# Patient Record
Sex: Female | Born: 2000
Health system: Southern US, Community
[De-identification: ages and names within clinical notes are randomized; demographics above are authoritative.]

## PROBLEM LIST (undated history)

## (undated) DIAGNOSIS — K219 Gastro-esophageal reflux disease without esophagitis: Secondary | ICD-10-CM

## (undated) DIAGNOSIS — G43909 Migraine, unspecified, not intractable, without status migrainosus: Secondary | ICD-10-CM

## (undated) DIAGNOSIS — Z9889 Other specified postprocedural states: Secondary | ICD-10-CM

## (undated) DIAGNOSIS — Z789 Other specified health status: Secondary | ICD-10-CM

## (undated) DIAGNOSIS — R112 Nausea with vomiting, unspecified: Secondary | ICD-10-CM

## (undated) DIAGNOSIS — M719 Bursopathy, unspecified: Secondary | ICD-10-CM

## (undated) HISTORY — PX: DENTAL SURGERY: SHX609

## (undated) HISTORY — DX: Gastro-esophageal reflux disease without esophagitis: K21.9

## (undated) HISTORY — PX: TYMPANOSTOMY TUBE PLACEMENT: SHX32

---
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
## 2019-12-10 ENCOUNTER — Encounter: Payer: Self-pay | Admitting: Internal Medicine

## 2019-12-24 ENCOUNTER — Ambulatory Visit: Payer: Self-pay | Admitting: Nurse Practitioner

## 2019-12-24 NOTE — Progress Notes (Deleted)
Primary Care Physician:  [name], MD Primary Gastroenterologist:  Dr.   Bonnetta Barry chief complaint on file.   HPI:   Annette Gould is a 19 y.o. female who presents on referral from primary care on 12/10/2019 for nausea.  Reviewed information provided with referral including ***.  No history of colonoscopy or endoscopy in our system.  No history of recent labs in our system.  Today she states   No past medical history on file.  *** The histories are not reviewed yet. Please review them in the "History" navigator section and refresh this SmartLink.  No current outpatient medications on file.   No current facility-administered medications for this visit.    Allergies as of 12/24/2019  . (Not on File)    No family history on file.  Social History   Socioeconomic History  . Marital status: Single    Spouse name: Not on file  . Number of children: Not on file  . Years of education: Not on file  . Highest education level: Not on file  Occupational History  . Not on file  Tobacco Use  . Smoking status: Not on file  Substance and Sexual Activity  . Alcohol use: Not on file  . Drug use: Not on file  . Sexual activity: Not on file  Other Topics Concern  . Not on file  Social History Narrative  . Not on file   Social Determinants of Health   Financial Resource Strain:   . Difficulty of Paying Living Expenses:   Food Insecurity:   . Worried About Programme researcher, broadcasting/film/video in the Last Year:   . Barista in the Last Year:   Transportation Needs:   . Freight forwarder (Medical):   Marland Kitchen Lack of Transportation (Non-Medical):   Physical Activity:   . Days of Exercise per Week:   . Minutes of Exercise per Session:   Stress:   . Feeling of Stress :   Social Connections:   . Frequency of Communication with Friends and Family:   . Frequency of Social Gatherings with Friends and Family:   . Attends Religious Services:   . Active Member of Clubs or Organizations:    . Attends Banker Meetings:   Marland Kitchen Marital Status:   Intimate Partner Violence:   . Fear of Current or Ex-Partner:   . Emotionally Abused:   Marland Kitchen Physically Abused:   . Sexually Abused:     Review of Systems: General: Negative for anorexia, weight loss, fever, chills, fatigue, weakness. Eyes: Negative for vision changes.  ENT: Negative for hoarseness, difficulty swallowing , nasal congestion. CV: Negative for chest pain, angina, palpitations, dyspnea on exertion, peripheral edema.  Respiratory: Negative for dyspnea at rest, dyspnea on exertion, cough, sputum, wheezing.  GI: See history of present illness. GU:  Negative for dysuria, hematuria, urinary incontinence, urinary frequency, nocturnal urination.  MS: Negative for joint pain, low back pain.  Derm: Negative for rash or itching.  Neuro: Negative for weakness, abnormal sensation, seizure, frequent headaches, memory loss, confusion.  Psych: Negative for anxiety, depression, suicidal ideation, hallucinations.  Endo: Negative for unusual weight change.  Heme: Negative for bruising or bleeding. Allergy: Negative for rash or hives.    Physical Exam: There were no vitals taken for this visit. General:   Alert and oriented. Pleasant and cooperative. Well-nourished and well-developed.  Head:  Normocephalic and atraumatic. Eyes:  Without icterus, sclera clear and conjunctiva pink.  Ears:  Normal  auditory acuity. Mouth:  No deformity or lesions, oral mucosa pink.  Throat/Neck:  Supple, without mass or thyromegaly. Cardiovascular:  S1, S2 present without murmurs appreciated. Normal pulses noted. Extremities without clubbing or edema. Respiratory:  Clear to auscultation bilaterally. No wheezes, rales, or rhonchi. No distress.  Gastrointestinal:  +BS, soft, non-tender and non-distended. No HSM noted. No guarding or rebound. No masses appreciated.  Rectal:  Deferred  Musculoskalatal:  Symmetrical without gross deformities.  Normal posture. Skin:  Intact without significant lesions or rashes. Neurologic:  Alert and oriented x4;  grossly normal neurologically. Psych:  Alert and cooperative. Normal mood and affect. Heme/Lymph/Immune: No significant cervical adenopathy. No excessive bruising noted.    12/24/2019 7:45 AM   Disclaimer: This note was dictated with voice recognition software. Similar sounding words can inadvertently be transcribed and may not be corrected upon review.

## 2019-12-28 ENCOUNTER — Emergency Department (HOSPITAL_COMMUNITY): Payer: Commercial Managed Care - PPO

## 2019-12-28 ENCOUNTER — Encounter (HOSPITAL_COMMUNITY): Payer: Self-pay | Admitting: Emergency Medicine

## 2019-12-28 ENCOUNTER — Emergency Department (HOSPITAL_COMMUNITY)
Admission: EM | Admit: 2019-12-28 | Discharge: 2019-12-28 | Disposition: A | Discharge status: Home / Self Care | Payer: Commercial Managed Care - PPO | Attending: Emergency Medicine | Admitting: Emergency Medicine

## 2019-12-28 ENCOUNTER — Other Ambulatory Visit: Payer: Self-pay

## 2019-12-28 DIAGNOSIS — Z79899 Other long term (current) drug therapy: Secondary | ICD-10-CM | POA: Insufficient documentation

## 2019-12-28 DIAGNOSIS — R1013 Epigastric pain: Secondary | ICD-10-CM | POA: Insufficient documentation

## 2019-12-28 DIAGNOSIS — R112 Nausea with vomiting, unspecified: Secondary | ICD-10-CM | POA: Insufficient documentation

## 2019-12-28 LAB — CBC WITH DIFFERENTIAL/PLATELET
Abs Immature Granulocytes: 0.03 10*3/uL (ref 0.00–0.07)
Basophils Absolute: 0.1 10*3/uL (ref 0.0–0.1)
Basophils Relative: 1 %
Eosinophils Absolute: 0.1 10*3/uL (ref 0.0–0.5)
Eosinophils Relative: 1 %
HCT: 42.4 % (ref 36.0–46.0)
Hemoglobin: 14.3 g/dL (ref 12.0–15.0)
Immature Granulocytes: 0 %
Lymphocytes Relative: 14 %
Lymphs Abs: 1.6 10*3/uL (ref 0.7–4.0)
MCH: 27 pg (ref 26.0–34.0)
MCHC: 33.7 g/dL (ref 30.0–36.0)
MCV: 80 fL (ref 80.0–100.0)
Monocytes Absolute: 0.7 10*3/uL (ref 0.1–1.0)
Monocytes Relative: 6 %
Neutro Abs: 8.8 10*3/uL — ABNORMAL HIGH (ref 1.7–7.7)
Neutrophils Relative %: 78 %
Platelets: 330 10*3/uL (ref 150–400)
RBC: 5.3 MIL/uL — ABNORMAL HIGH (ref 3.87–5.11)
RDW: 12.1 % (ref 11.5–15.5)
WBC: 11.2 10*3/uL — ABNORMAL HIGH (ref 4.0–10.5)
nRBC: 0 % (ref 0.0–0.2)

## 2019-12-28 LAB — COMPREHENSIVE METABOLIC PANEL
ALT: 16 U/L (ref 0–44)
AST: 18 U/L (ref 15–41)
Albumin: 4.4 g/dL (ref 3.5–5.0)
Alkaline Phosphatase: 60 U/L (ref 38–126)
Anion gap: 10 (ref 5–15)
BUN: 11 mg/dL (ref 6–20)
CO2: 23 mmol/L (ref 22–32)
Calcium: 9.2 mg/dL (ref 8.9–10.3)
Chloride: 104 mmol/L (ref 98–111)
Creatinine, Ser: 0.73 mg/dL (ref 0.44–1.00)
GFR calc Af Amer: >60 mL/min (ref >60)
GFR calc non Af Amer: >60 mL/min (ref >60)
Glucose, Bld: 109 mg/dL — ABNORMAL HIGH (ref 70–99)
Potassium: 3.7 mmol/L (ref 3.5–5.1)
Sodium: 137 mmol/L (ref 135–145)
Total Bilirubin: 0.4 mg/dL (ref 0.3–1.2)
Total Protein: 8 g/dL (ref 6.5–8.1)

## 2019-12-28 LAB — URINALYSIS, ROUTINE W REFLEX MICROSCOPIC
Bilirubin Urine: NEGATIVE
Glucose, UA: NEGATIVE mg/dL
Hgb urine dipstick: NEGATIVE
Ketones, ur: 20 mg/dL — AB
Leukocytes,Ua: NEGATIVE
Nitrite: NEGATIVE
Protein, ur: NEGATIVE mg/dL
Specific Gravity, Urine: 1.018 (ref 1.005–1.030)
pH: 6 (ref 5.0–8.0)

## 2019-12-28 LAB — POC URINE PREG, ED: Preg Test, Ur: NEGATIVE

## 2019-12-28 LAB — LIPASE, BLOOD: Lipase: 21 U/L (ref 11–51)

## 2019-12-28 IMAGING — US US ABDOMEN COMPLETE
1 series · 14 of 25 positions shown · non-contrast
Comparison: None

CLINICAL DATA: Upper abdominal pain with nausea vomiting for 3
months

EXAM:
ABDOMEN ULTRASOUND COMPLETE

[Series 1: us abdomen complete · 14 of 89 slices shown]
[im 1/89]
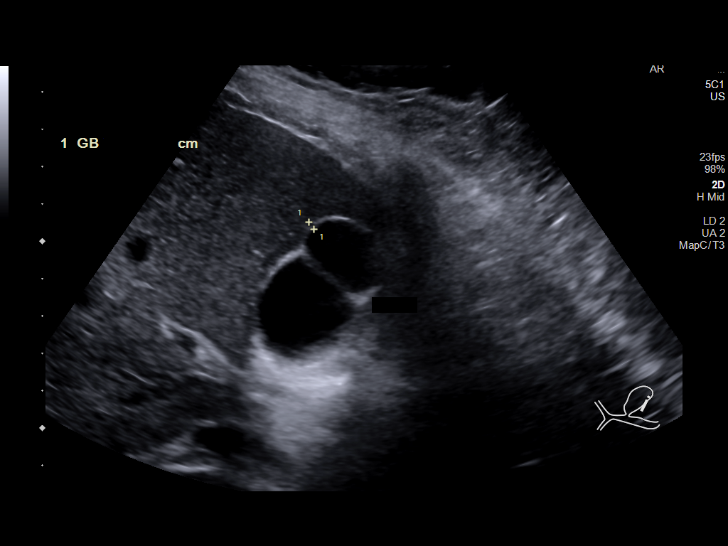
[im 8/89]
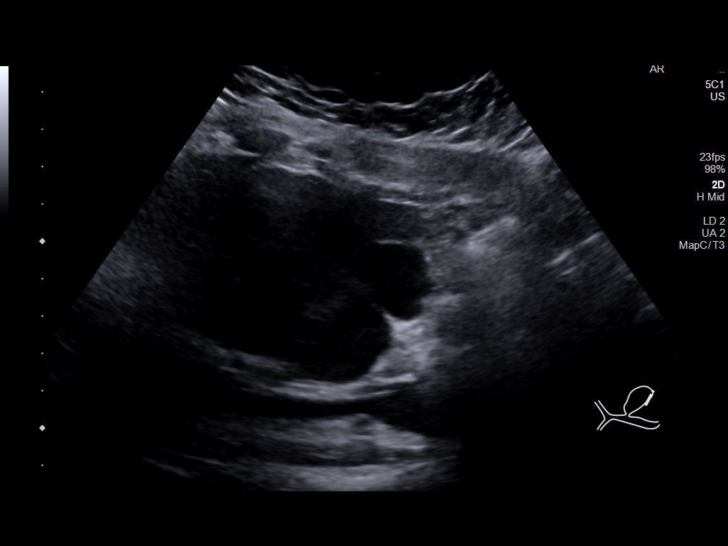
[im 15/89]
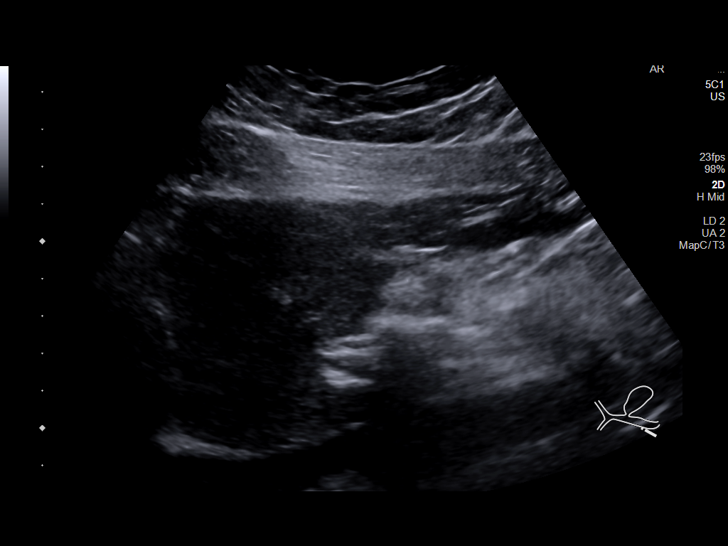
[im 23/89]
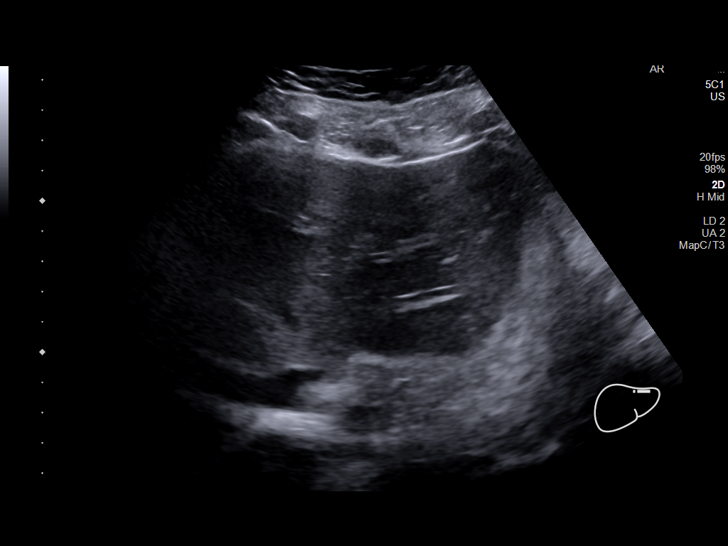
[im 30/89]
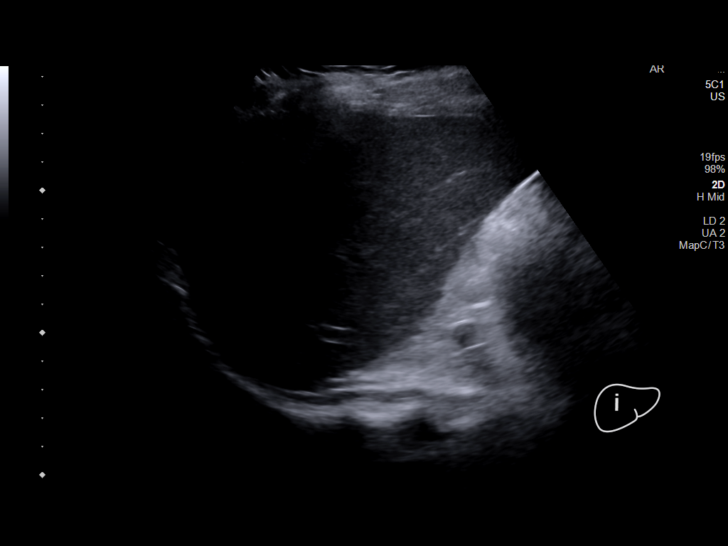
[im 34/89]
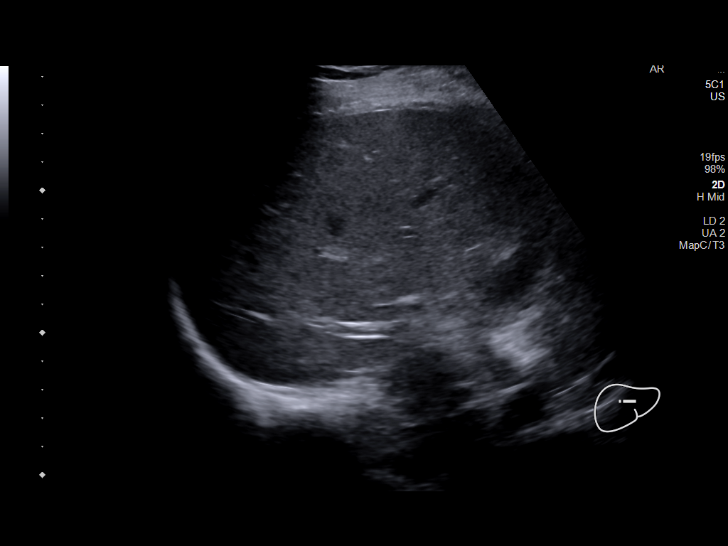
[im 41/89]
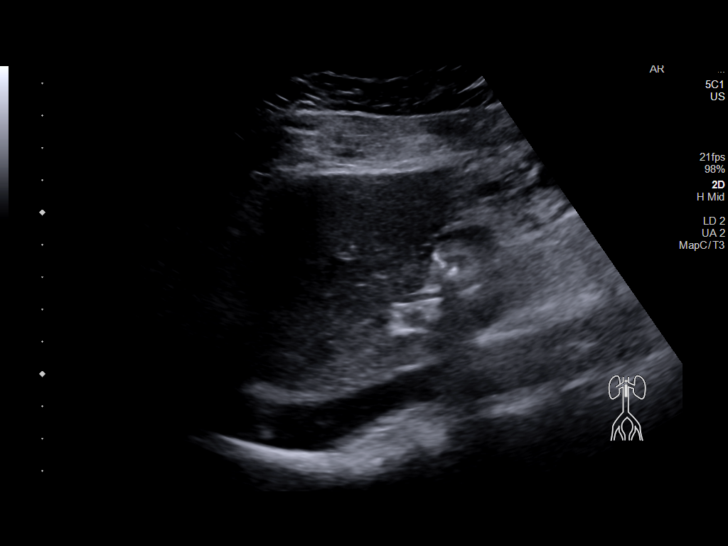
[im 48/89]
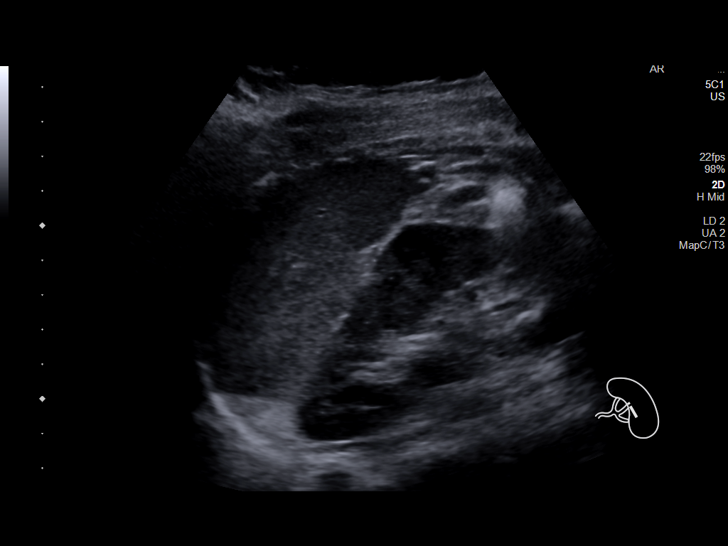
[im 56/89]
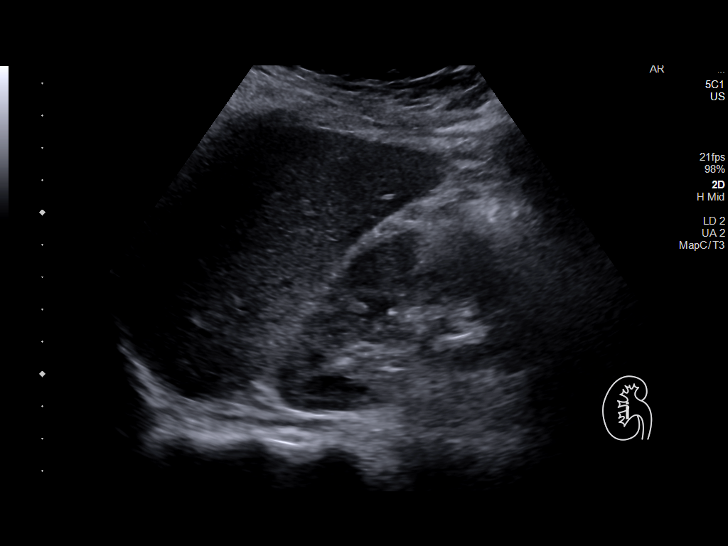
[im 59/89]
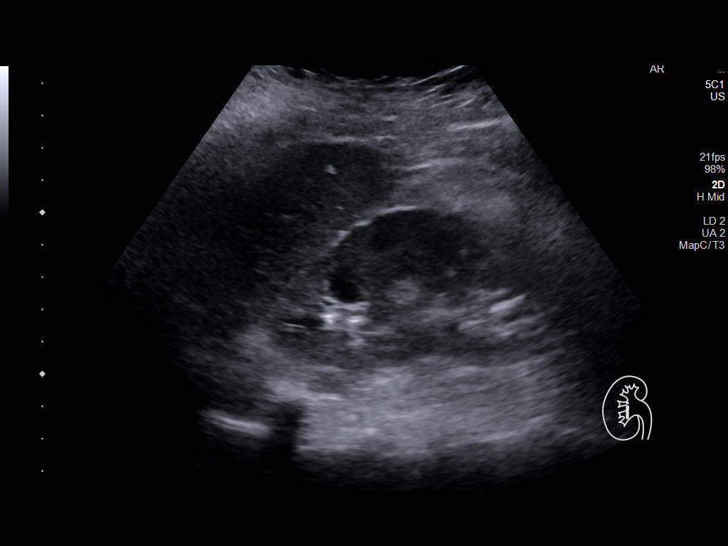
[im 67/89]
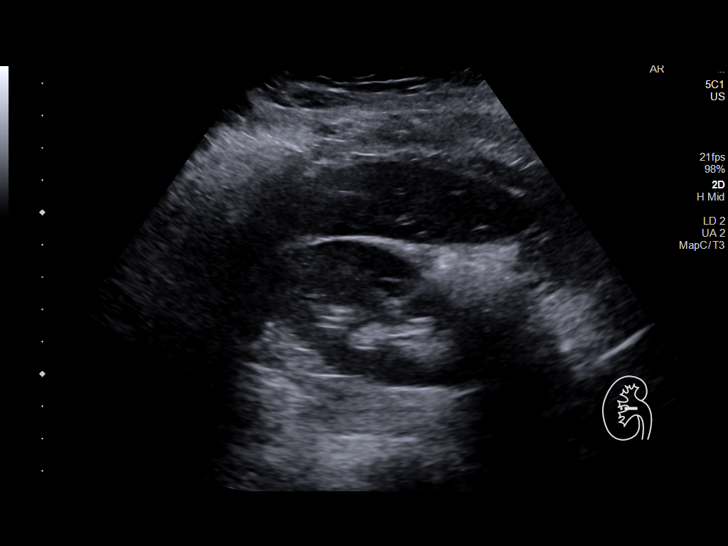
[im 74/89]
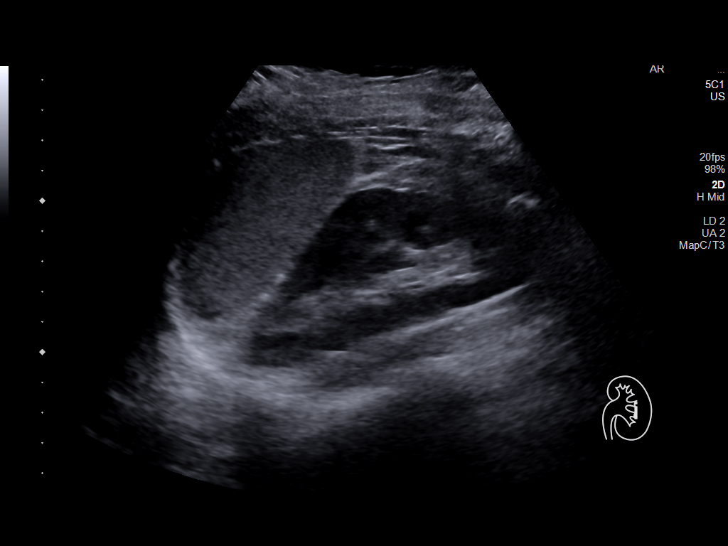
[im 81/89]
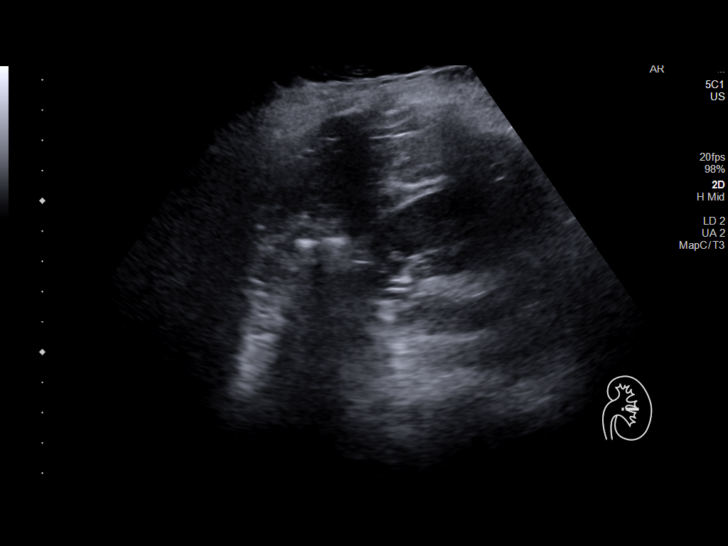
[im 89/89]
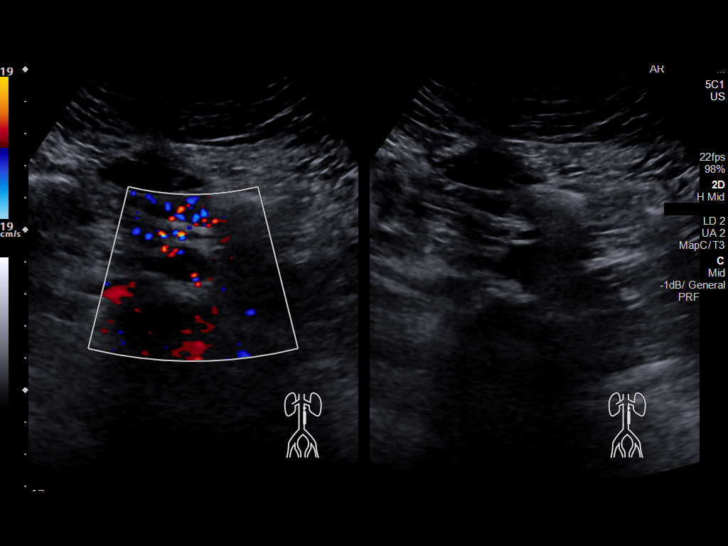

[14 of 25 positions shown; findings below may reference images not displayed]

FINDINGS: Gallbladder: Normally distended without stones or wall thickening.
Prominent fold noted. No pericholecystic fluid or sonographic Murphy
sign.

Common bile duct: Diameter: 3 mm, normal

Liver: Normal echogenicity without mass or nodularity. Portal vein
is patent on color Doppler imaging with normal direction of blood
flow towards the liver.

IVC: Normal appearance

Pancreas: Normal appearance

Spleen: Normal appearance, 8.3 cm length

Right Kidney: Length: 10.2 cm. Normal morphology without mass or
hydronephrosis.

Left Kidney: Length: 10.5 cm. Normal morphology without mass or
hydronephrosis.

Abdominal aorta: Normal caliber

Other findings: No free fluid
IMPRESSION: Normal exam.

## 2019-12-28 MED ORDER — LIDOCAINE VISCOUS HCL 2 % MT SOLN
15.0000 mL | Freq: Once | OROMUCOSAL | Status: AC
  Administered 2019-12-28: 15 mL via ORAL
  Filled 2019-12-28: qty 15

## 2019-12-28 MED ORDER — SODIUM CHLORIDE 0.9 % IV BOLUS
1000.0000 mL | Freq: Once | INTRAVENOUS | Status: AC
  Administered 2019-12-28: 09:00:00 1000 mL via INTRAVENOUS

## 2019-12-28 MED ORDER — ONDANSETRON HCL 4 MG/2ML IJ SOLN
4.0000 mg | Freq: Once | INTRAMUSCULAR | Status: AC
  Administered 2019-12-28: 4 mg via INTRAVENOUS
  Filled 2019-12-28: qty 2

## 2019-12-28 MED ORDER — FAMOTIDINE 20 MG PO TABS: 20.0000 mg | ORAL_TABLET | Freq: Two times a day (BID) | ORAL | 0 refills | Status: DC

## 2019-12-28 MED ORDER — OMEPRAZOLE 40 MG PO CPDR: 40.0000 mg | DELAYED_RELEASE_CAPSULE | Freq: Two times a day (BID) | ORAL | 0 refills | Status: DC

## 2019-12-28 MED ORDER — PANTOPRAZOLE SODIUM 40 MG IV SOLR
40.0000 mg | Freq: Once | INTRAVENOUS | Status: AC
  Administered 2019-12-28: 09:00:00 40 mg via INTRAVENOUS
  Filled 2019-12-28: qty 40

## 2019-12-28 MED ORDER — ONDANSETRON 4 MG PO TBDP: 4.0000 mg | ORAL_TABLET | Freq: Three times a day (TID) | ORAL | 0 refills | Status: DC | PRN

## 2019-12-28 MED ORDER — ALUM & MAG HYDROXIDE-SIMETH 200-200-20 MG/5ML PO SUSP
30.0000 mL | Freq: Once | ORAL | Status: AC
  Administered 2019-12-28: 11:00:00 30 mL via ORAL
  Filled 2019-12-28: qty 30

## 2019-12-28 NOTE — ED Provider Notes (Signed)
[facility] EMERGENCY DEPARTMENT Provider Note   CSN: [id number] Arrival date & time: 12/28/19  8676     History Chief Complaint  Patient presents with  . Emesis    Annette Gould is a 19 y.o. female with a past medical history of anxiety and GERD presenting with an approximate 4 month history of nausea and vomiting felt related to her acid reflux but has been more frequent the past several days.  She describes epigastric burning with radiation into her mid chest which has been constant the past several days despite being on a daily PPI.  She reports 4-5 episodes of nonbloody emesis in the past several days.  She feels lightheaded and weak, has had no PO intake in 2 days. She denies fevers, alternates between diarrhea and constipation which is a chronic pattern, currently is constipated with last bm several days ago.  She has found no alleviators for her symptoms.  Was referred to GI by her pcp, her appt was 3 days ago but did not go as she thought she was feeling better.    The history is provided by the patient.       History reviewed. No pertinent past medical history.  There are no problems to display for this patient.   Past Surgical History:  Procedure Laterality Date  . DENTAL SURGERY       OB History   No obstetric history on file.     History reviewed. No pertinent family history.  Social History   Tobacco Use  . Smoking status: Never Smoker  . Smokeless tobacco: Never Used  Substance Use Topics  . Alcohol use: Not Currently  . Drug use: Yes    Types: Marijuana    Home Medications Prior to Admission medications   Medication Sig Start Date End Date Taking? Authorizing Provider  AUROVELA FE 1.5/30 1.5-30 MG-MCG tablet Take 1 tablet by mouth daily. 11/12/19  Yes [provider]  ondansetron (ZOFRAN) 4 MG tablet Take 4 mg by mouth every 8 (eight) hours as needed for nausea or vomiting.   Yes [provider]  propranolol (INDERAL) 10 MG  tablet Take 10 mg by mouth daily. 11/12/19  Yes [provider]  sertraline (ZOLOFT) 50 MG tablet Take 75 mg by mouth daily. 11/12/19  Yes [provider]  omeprazole (PRILOSEC) 40 MG capsule Take 1 capsule (40 mg total) by mouth in the morning and at bedtime. 12/28/19   [name], PA-C  ondansetron (ZOFRAN ODT) 4 MG disintegrating tablet Take 1 tablet (4 mg total) by mouth every 8 (eight) hours as needed for nausea or vomiting. 12/28/19   Keefe Zawistowski, Raynelle Fanning, PA-C  famotidine (PEPCID) 20 MG tablet Take 1 tablet (20 mg total) by mouth 2 (two) times daily. 12/28/19 12/28/19  [name], PA-C    Allergies    Patient has no known allergies.  Review of Systems   Review of Systems  Constitutional: Negative for chills and fever.  HENT: Negative for congestion.   Eyes: Negative.   Respiratory: Negative for chest tightness and shortness of breath.   Cardiovascular: Negative for chest pain.  Gastrointestinal: Positive for abdominal pain, constipation, nausea and vomiting.  Genitourinary: Negative.  Negative for dysuria.  Musculoskeletal: Negative for arthralgias, joint swelling and neck pain.  Skin: Negative.  Negative for rash and wound.  Neurological: Positive for light-headedness. Negative for dizziness, numbness and headaches.  Psychiatric/Behavioral: Negative.     Physical Exam Updated Vital Signs BP (!) 145/94 (BP Location: Left  Arm)   Pulse 78   Temp 98.3 F (36.8 C) (Oral)   Resp 17   Ht 5\' 2"  (1.575 m)   Wt 68 kg   LMP  (Exact Date)   SpO2 97%   BMI 27.44 kg/m   Physical Exam Vitals and nursing note reviewed.  Constitutional:      Appearance: She is well-developed.  HENT:     Head: Normocephalic and atraumatic.  Eyes:     Conjunctiva/sclera: Conjunctivae normal.  Cardiovascular:     Rate and Rhythm: Normal rate and regular rhythm.     Heart sounds: Normal heart sounds.  Pulmonary:     Effort: Pulmonary effort is normal.     Breath sounds: Normal breath sounds.  No wheezing.  Abdominal:     General: Bowel sounds are normal.     Palpations: Abdomen is soft.     Tenderness: There is abdominal tenderness in the epigastric area and left upper quadrant. There is no guarding or rebound. Negative signs include Murphy's sign.  Musculoskeletal:        General: Normal range of motion.     Cervical back: Normal range of motion.  Skin:    General: Skin is warm and dry.  Neurological:     Mental Status: She is alert.     ED Results / Procedures / Treatments   Labs (all labs ordered are listed, but only abnormal results are displayed) Labs Reviewed  CBC WITH DIFFERENTIAL/PLATELET - Abnormal; Notable for the following components:      Result Value   WBC 11.2 (*)    RBC 5.30 (*)    Neutro Abs 8.8 (*)    All other components within normal limits  COMPREHENSIVE METABOLIC PANEL - Abnormal; Notable for the following components:   Glucose, Bld 109 (*)    All other components within normal limits  URINALYSIS, ROUTINE W REFLEX MICROSCOPIC - Abnormal; Notable for the following components:   APPearance HAZY (*)    Ketones, ur 20 (*)    All other components within normal limits  LIPASE, BLOOD  POC URINE PREG, ED    EKG None  Radiology Abdomen Complete  Result Date: 12/28/2019 CLINICAL DATA:  Upper abdominal pain with nausea vomiting for 3 months EXAM: ABDOMEN ULTRASOUND COMPLETE COMPARISON:  None FINDINGS: Gallbladder: Normally distended without stones or wall thickening. Prominent fold noted. No pericholecystic fluid or sonographic Murphy sign. Common bile duct: Diameter: 3 mm, normal Liver: Normal echogenicity without mass or nodularity. Portal vein is patent on color Doppler imaging with normal direction of blood flow towards the liver. IVC: Normal appearance Pancreas: Normal appearance Spleen: Normal appearance, 8.3 cm length Right Kidney: Length: 10.2 cm. Normal morphology without mass or hydronephrosis. Left Kidney: Length: 10.5 cm. Normal  morphology without mass or hydronephrosis. Abdominal aorta: Normal caliber Other findings: No free fluid IMPRESSION: Normal exam. Electronically Signed   By: 12/30/2019 M.D.   On: 12/28/2019 09:47    Procedures Procedures (including critical care time)  Medications Ordered in ED Medications  sodium chloride 0.9 % bolus 1,000 mL (0 mLs Intravenous Stopped 12/28/19 1035)  pantoprazole (PROTONIX) injection 40 mg (40 mg Intravenous Given 12/28/19 0830)  ondansetron (ZOFRAN) injection 4 mg (4 mg Intravenous Given 12/28/19 0830)  alum & mag hydroxide-simeth (MAALOX/MYLANTA) 200-200-20 MG/5ML suspension 30 mL (30 mLs Oral Given 12/28/19 1035)    And  lidocaine (XYLOCAINE) 2 % viscous mouth solution 15 mL (15 mLs Oral Given 12/28/19 1035)    ED Course  I have reviewed the triage vital signs and the nursing notes.  Pertinent labs & imaging results that were available during my care of the patient were reviewed by me and considered in my medical decision making (see chart for details).    MDM Rules/Calculators/A&P                      Labs and imaging reviewed and discussed with pt. Which are reassuring, [place] negative for acute cholecystitis.  Sx suggesting persistent acid reflux/gerd, possible peptic ulcer, no findings to suggest acute abd, perforation,  abd exam benign.  She was encouraged to f/u with GI as originally recommended by pcp.  Increased omeprazole to bid dosing. Also trial of zofran in place of phenergan for better n/v relief.    Pt was seen by Dr. Reather Converse during ed visit.  Final Clinical Impression(s) / ED Diagnoses Final diagnoses:  Epigastric pain  Non-intractable vomiting with nausea, unspecified vomiting type    Rx / DC Orders ED Discharge Orders         Ordered    ondansetron (ZOFRAN ODT) 4 MG disintegrating tablet  Every 8 hours PRN     12/28/19 1101    famotidine (PEPCID) 20 MG tablet  2 times daily,   Status:  Discontinued     12/28/19 1101    omeprazole (PRILOSEC) 40  MG capsule  2 times daily     12/28/19 1103           Evalee Jefferson, Hershal Coria 12/28/19 1109    Elnora Morrison, MD 12/29/19 [phone]

## 2019-12-28 NOTE — Discharge Instructions (Addendum)
Your lab tests and your ultrasound test today are reassuring with normal findings.  You will benefit from the expertise of a gastroenterologist and are encouraged to get this appointment rescheduled.  Continue using your omeprazole but increase this medicine to twice daily dosing.    Also, you may try zofran instead of promethazine for nausea relief.

## 2019-12-28 NOTE — ED Triage Notes (Signed)
C/o vomiting for about 3 months.  Seen about one month about by [facility] in [place] and GI consulted.  Pt says she had appointment on Monday but did not go.  C/o vomiting, headache, fever and dizziness since yesterday.  Denies being around anyone with COVID.

## 2020-01-01 ENCOUNTER — Encounter: Payer: Self-pay | Admitting: Nurse Practitioner

## 2020-01-01 ENCOUNTER — Other Ambulatory Visit: Payer: Self-pay

## 2020-01-01 ENCOUNTER — Ambulatory Visit (INDEPENDENT_AMBULATORY_CARE_PROVIDER_SITE_OTHER): Payer: Commercial Managed Care - PPO | Admitting: Nurse Practitioner

## 2020-01-01 ENCOUNTER — Telehealth: Payer: Self-pay

## 2020-01-01 DIAGNOSIS — R1010 Upper abdominal pain, unspecified: Secondary | ICD-10-CM | POA: Diagnosis not present

## 2020-01-01 DIAGNOSIS — K219 Gastro-esophageal reflux disease without esophagitis: Secondary | ICD-10-CM | POA: Insufficient documentation

## 2020-01-01 DIAGNOSIS — R112 Nausea with vomiting, unspecified: Secondary | ICD-10-CM | POA: Insufficient documentation

## 2020-01-01 DIAGNOSIS — K5900 Constipation, unspecified: Secondary | ICD-10-CM | POA: Diagnosis not present

## 2020-01-01 DIAGNOSIS — R1084 Generalized abdominal pain: Secondary | ICD-10-CM | POA: Insufficient documentation

## 2020-01-01 DIAGNOSIS — R109 Unspecified abdominal pain: Secondary | ICD-10-CM | POA: Insufficient documentation

## 2020-01-01 DIAGNOSIS — R1011 Right upper quadrant pain: Secondary | ICD-10-CM

## 2020-01-01 MED ORDER — PANTOPRAZOLE SODIUM 40 MG PO TBEC: 40.0000 mg | DELAYED_RELEASE_TABLET | Freq: Two times a day (BID) | ORAL | 3 refills | Status: DC

## 2020-01-01 NOTE — Patient Instructions (Signed)
Your health issues we discussed today were:   GERD (reflux/heartburn): 1. I have sent a prescription to your pharmacy for Protonix (pantoprazole) 40 mg.  Take this twice a day, first thing in the morning and 30 minutes before your last meal the day 2. You can continue to use famotidine (Pepcid) as needed for breakthrough GERD symptoms despite taking Protonix 3. Call us if you have any worsening or severe symptoms  Abdominal pain with nausea/vomiting and "dark stools": 1. As we discussed, I think a lot of your abdominal pain is due to your GERD symptoms 2. However, given your recent ibuprofen and other type medication use along with your dark stools, complete your stool test and bring it back to our office so we can check for blood 3. Continue to use ondansetron (Zofran) dissolving tablet as needed for nausea 4. We will put in an order for a HIDA scan and help schedule this for you 5. Further recommendations will follow your results  Constipation: 1. As we discussed, I do not think your constipation is severe enough to need a prescription medicine 2. It is likely due to decreased oral intake with your other GERD type symptoms 3. Start taking over-the-counter Colace 100 mg once a day 4. You can use MiraLAX powder, 1 dose once a day if needed for constipation 5. Call us if you have any worsening or severe symptoms  Overall I recommend:  1. Continue your other current medications 2. Return for follow-up in 4 weeks 3. Call us if you have any questions or concerns   ---------------------------------------------------------------  COVID-19 Vaccine Information can be found at: [email or web address] For questions related to vaccine distribution or appointments, please email [email or web address] or call [phone].   ---------------------------------------------------------------   At [facility] we value your  feedback. You may receive a survey about your visit today. Please share your experience as we strive to create trusting relationships with our patients to provide genuine, compassionate, quality care.  We appreciate your understanding and patience as we review any laboratory studies, imaging, and other diagnostic tests that are ordered as we care for you. Our office policy is 5 business days for review of these results, and any emergent or urgent results are addressed in a timely manner for your best interest. If you do not hear from our office in 1 week, please contact us.   We also encourage the use of MyChart, which contains your medical information for your review as well. If you are not enrolled in this feature, an access code is on this after visit summary for your convenience. Thank you for allowing [place] to be involved in your care.  It was great to see you today!  I hope you have a great day!!

## 2020-01-01 NOTE — Telephone Encounter (Signed)
Called [facility], no PA needed for HIDA scan. Ref# [id number].

## 2020-01-01 NOTE — Progress Notes (Signed)
Primary Care Physician:  [name], MD Primary Gastroenterologist:  Dr. Jena Gauss  Chief Complaint  Patient presents with  . Nausea    with vomiting  . Constipation    last BM was friday, straining  . Abdominal Pain    upper abd with some swelling    HPI:   Annette Gould is a 19 y.o. female who presents on referral from primary care for nausea.  Reviewed information provided with referral including notes from primary care indicating ongoing nausea.  The patient was referred to GI.  Noted Zofran and omeprazole.  Incidentally the patient was in the emergency department 12/28/2019 for epigastric pain and non-intractable vomiting with nausea.  At that time noted history of anxiety and GERD with 4 months of nausea and vomiting felt related to GERD but more progressive over the previous few days.  Describes epigastric burning and radiation to the chest despite daily PPI.  Noted 4-5 episodes of nonbloody emesis in the past several days, lightheadedness, weakness, no p.o. intake in 2 days.  No fevers.  Initially referred to GI by primary care but missed her appointment 3 days prior because she thought she was feeling better.  Labs including CBC found mild leukocytosis at 11.2, normal hemoglobin.  CMP and lipase essentially normal.  Urine pregnancy test negative, urinalysis not consistent with UTI.  Abdominal ultrasound with no significant findings.  Recommended follow-up with primary care and GI.  Today she states she's doing ok overall. She notes persistent N/V for the past couple months, started with the GERD/esophageal burning. Noted, esophageal burning and epigastric pain; no bitter teste. Used to be morning times, now it is throughout the day. Nausea postprandially. Food triggers include spicy foods, chocolate. Has vomiting rarely/intermittently (last episode was Friday). Pushing fluids. Also with some constipation, last bowel movement was about 4 days ago. Previously once a day BM. Started  having more constipation 2 weeks ago, recently hard stools/straining. Has been on BRAT diet due to UGI symptoms. Drinks about 1-2 bottles of water a day, but mostly drinks Gatorade and Pedialyte. Has had "really dar" stools in the last 2 weeks (denies iron or pepto). A few months ago will get 'fevers" postprandially, about 99 TMax. Denies hematochezia, chills, unintentional weight loss. Denies URI or flu-like symptoms. Denies loss of sense of taste or smell. Denies chest pain, dyspnea, dizziness, lightheadedness, syncope, near syncope. Denies any other upper or lower GI symptoms.  No recent NSAIDs but previously took it "a lot" due to headaches (last dose a few months ago). Denies ASA powders.  She is not sure if she's taking her PPI. Thought it was changed to famotadine (which according to the ER note was ADDED, but should still be on PPI). Zofran ODT helps her nausea "sometimes"  Past Medical History:  Diagnosis Date  . Known health problems: none     Past Surgical History:  Procedure Laterality Date  . DENTAL SURGERY    . TYMPANOSTOMY TUBE PLACEMENT      Current Outpatient Medications  Medication Sig Dispense Refill  . AUROVELA FE 1.5/30 1.5-30 MG-MCG tablet Take 1 tablet by mouth daily.    . ondansetron (ZOFRAN ODT) 4 MG disintegrating tablet Take 1 tablet (4 mg total) by mouth every 8 (eight) hours as needed for nausea or vomiting. 20 tablet 0  . propranolol (INDERAL) 10 MG tablet Take 10 mg by mouth daily.    . sertraline (ZOLOFT) 50 MG tablet Take 75 mg by mouth daily.  No current facility-administered medications for this visit.    Allergies as of 01/01/2020  . (No Known Allergies)    Family History  Problem Relation Age of Onset  . Gastric cancer Maternal Grandfather        "not sure where, but somewhere in the stomach area"  . Colon cancer Neg Hx     Social History   Socioeconomic History  . Marital status: Single    Spouse name: Not on file  . Number of  children: Not on file  . Years of education: Not on file  . Highest education level: Not on file  Occupational History  . Not on file  Tobacco Use  . Smoking status: Former Smoker    Types: Cigarettes  . Smokeless tobacco: Never Used  Substance and Sexual Activity  . Alcohol use: Not Currently  . Drug use: Not Currently    Types: Marijuana    Comment: denies 01/01/20  . Sexual activity: Yes  Other Topics Concern  . Not on file  Social History Narrative  . Not on file   Social Determinants of Health   Financial Resource Strain:   . Difficulty of Paying Living Expenses:   Food Insecurity:   . Worried About Programme researcher, broadcasting/film/video in the Last Year:   . Barista in the Last Year:   Transportation Needs:   . Freight forwarder (Medical):   Marland Kitchen Lack of Transportation (Non-Medical):   Physical Activity:   . Days of Exercise per Week:   . Minutes of Exercise per Session:   Stress:   . Feeling of Stress :   Social Connections:   . Frequency of Communication with Friends and Family:   . Frequency of Social Gatherings with Friends and Family:   . Attends Religious Services:   . Active Member of Clubs or Organizations:   . Attends Banker Meetings:   Marland Kitchen Marital Status:   Intimate Partner Violence:   . Fear of Current or Ex-Partner:   . Emotionally Abused:   Marland Kitchen Physically Abused:   . Sexually Abused:     Subjective: Review of Systems  Constitutional: Positive for fever. Negative for chills, malaise/fatigue and weight loss.  HENT: Negative for congestion and sore throat.   Respiratory: Negative for cough and shortness of breath.   Cardiovascular: Negative for chest pain and palpitations.  Gastrointestinal: Positive for abdominal pain, constipation, nausea and vomiting. Negative for blood in stool and diarrhea.       "dark stools"  Musculoskeletal: Negative for joint pain and myalgias.  Skin: Negative for rash.  Neurological: Positive for dizziness. Negative  for weakness.  Endo/Heme/Allergies: Does not bruise/bleed easily.  Psychiatric/Behavioral: Negative for depression. The patient is not nervous/anxious.   All other systems reviewed and are negative.     Objective: BP (!) 142/89   Pulse 81   Temp (!) 97.3 F (36.3 C) (Oral)   Ht 5\' 2"  (1.575 m)   Wt 165 lb (74.8 kg)   LMP 12/29/2019 Comment: sometimes  BMI 30.18 kg/m  Physical Exam Vitals and nursing note reviewed.  Constitutional:      General: She is not in acute distress.    Appearance: She is well-developed and normal weight. She is not ill-appearing, toxic-appearing or diaphoretic.  HENT:     Head: Normocephalic and atraumatic.  Cardiovascular:     Rate and Rhythm: Normal rate and regular rhythm.     Heart sounds: Normal heart sounds.  Pulmonary:  Effort: Pulmonary effort is normal. No respiratory distress.     Breath sounds: Normal breath sounds.  Abdominal:     General: Abdomen is flat. Bowel sounds are normal. There is no distension.     Palpations: Abdomen is soft. There is no hepatomegaly, splenomegaly or mass.     Tenderness: There is abdominal tenderness in the epigastric area, periumbilical area and left upper quadrant. There is no guarding or rebound.     Hernia: No hernia is present.    Skin:    General: Skin is warm and dry.     Coloration: Skin is not jaundiced.     Findings: No rash.  Neurological:     General: No focal deficit present.     Mental Status: She is alert and oriented to person, place, and time.  Psychiatric:        Attention and Perception: Attention normal.        Mood and Affect: Mood normal. Mood is not anxious or depressed.        Speech: Speech normal.        Behavior: Behavior normal.        Thought Content: Thought content normal.        Cognition and Memory: Cognition and memory normal.      01/01/2020 2:32 PM   Disclaimer: This note was dictated with voice recognition software. Similar sounding words can  inadvertently be transcribed and may not be corrected upon review.

## 2020-01-02 ENCOUNTER — Encounter: Payer: Self-pay | Admitting: Internal Medicine

## 2020-01-08 ENCOUNTER — Encounter (HOSPITAL_COMMUNITY): Payer: Self-pay

## 2020-01-08 ENCOUNTER — Ambulatory Visit (HOSPITAL_COMMUNITY)
Admission: RE | Admit: 2020-01-08 | Discharge: 2020-01-08 | Disposition: A | Discharge status: Home / Self Care | Payer: Commercial Managed Care - PPO | Source: Ambulatory Visit | Attending: Nurse Practitioner | Admitting: Nurse Practitioner

## 2020-01-08 ENCOUNTER — Other Ambulatory Visit: Payer: Self-pay

## 2020-01-08 DIAGNOSIS — R1010 Upper abdominal pain, unspecified: Secondary | ICD-10-CM | POA: Diagnosis present

## 2020-01-08 DIAGNOSIS — R112 Nausea with vomiting, unspecified: Secondary | ICD-10-CM | POA: Diagnosis present

## 2020-01-08 DIAGNOSIS — R1011 Right upper quadrant pain: Secondary | ICD-10-CM | POA: Insufficient documentation

## 2020-01-08 DIAGNOSIS — K5900 Constipation, unspecified: Secondary | ICD-10-CM | POA: Insufficient documentation

## 2020-01-08 DIAGNOSIS — K219 Gastro-esophageal reflux disease without esophagitis: Secondary | ICD-10-CM | POA: Insufficient documentation

## 2020-01-08 IMAGING — NM NM HEPATO W/GB/PHARM/[PERSON_NAME]
4 series · 14 of 14 positions shown · non-contrast
Comparison: None

CLINICAL DATA: Epigastric and RIGHT upper quadrant pain for 3
months with occasional nausea and vomiting, symptoms associated with
food

EXAM:
NUCLEAR MEDICINE HEPATOBILIARY IMAGING WITH GALLBLADDER EF
TECHNIQUE: Sequential images of the abdomen were obtained [DATE] minutes
following intravenous administration of radiopharmaceutical. After
oral ingestion of Ensure, gallbladder ejection fraction was
determined. At 60 min, normal ejection fraction is greater than 33%.
RADIOPHARMACEUTICALS:  5.3 mCi [HI]  Choletec IV

[Series 1: biliary · 3.25mm/px · 6 of 59 frames shown]
[frame 5/59]
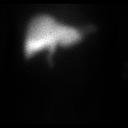
[frame 15/59]
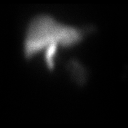
[frame 25/59]
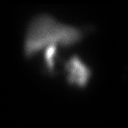
[frame 35/59]
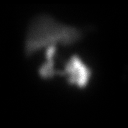
[frame 45/59]
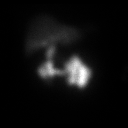
[frame 55/59]
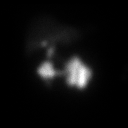

[Series 2: rt lat · 3.25mm/px · 1 of 1 slices shown (1 of 2)]
[im 1/1]
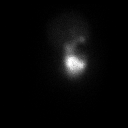

[Series 3: rt lat · 3.25mm/px · 1 of 1 slices shown (2 of 2)]
[im 1/1]
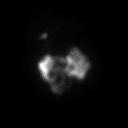

[Series 4: gbef · 3.25mm/px · 6 of 60 frames shown]
[frame 6/60]
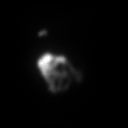
[frame 16/60]
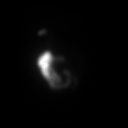
[frame 26/60]
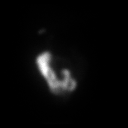
[frame 36/60]
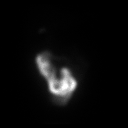
[frame 46/60]
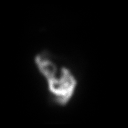
[frame 56/60]
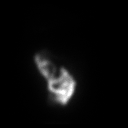

[14 of 14 positions shown; findings below may reference images not displayed]

FINDINGS: Normal tracer extraction from bloodstream indicating normal
hepatocellular function.

Normal excretion of tracer into biliary tree.

Gallbladder visualized at 56 min, appears small.

Small bowel visualized at 16 min.

No hepatic retention of tracer.

Subjectively normal emptying of tracer from gallbladder following
fatty meal stimulation.

Calculated gallbladder ejection fraction is 99%, normal.

Patient reported no symptoms following Ensure ingestion.

Normal gallbladder ejection fraction following Ensure ingestion is
greater than 33% at 1 hour.
IMPRESSION: Patent biliary tree with normal gallbladder ejection fraction of 99%
following fatty meal stimulation.

## 2020-01-08 MED ORDER — TECHNETIUM TC 99M MEBROFENIN IV KIT
5.0000 | PACK | Freq: Once | INTRAVENOUS | Status: AC | PRN
  Administered 2020-01-08: 5.3 via INTRAVENOUS

## 2020-01-11 ENCOUNTER — Telehealth: Payer: Self-pay | Admitting: Internal Medicine

## 2020-01-11 MED ORDER — ONDANSETRON HCL 4 MG PO TABS: 4.0000 mg | ORAL_TABLET | Freq: Four times a day (QID) | ORAL | 0 refills | Status: DC | PRN

## 2020-01-11 NOTE — Telephone Encounter (Signed)
Called pt back and her mom informed me that pt is having nausea, vomiting, headache, stomach pain, and constipation.  Mom says pt feels very weak.  She is scared of dehydration. Hard to keep anything down.  Mom says pt has been sucking on ice cubes, gatorade, and pedialyte.  Only urinated one time since 2:00 pm yesterday.  She hasn't taken any medication other than Pantoprazole.  Wanted to call us first.  Mom said that she did not really want to have to take pt to ER  Routing to [facility] in EG's absence.

## 2020-01-11 NOTE — Telephone Encounter (Signed)
Called pt and informed her that If she is having persistent N/V, unable to hydrate apparent by diminished urination over the past 24 hours, she should go to ER. She needs IV fluids. Pt informed that [facility] sent in RX for nausea medication, but is worried that she is already dehydrated and will have trouble catching back up without IV fluids.  Pt informed that her HIDA scan was normal. Pt voiced understanding and made aware that she really needs to proceed to ER.

## 2020-01-11 NOTE — Telephone Encounter (Signed)
If she is having persistent N/V, unable to hydrate apparent by diminished urination over the past 24 hours, she should go to ER. She needs IV fluids.   I can send in RX for nausea medication, but I worry that she is already dehydrated and will have trouble catching back up without IV fluids.   HIDA scan was normal.    I will send in RX for nausea medication. Further instructions from EG about outpatient work up. However for today, would consider ED evaluation to get IV fluids at minimum.

## 2020-01-11 NOTE — Addendum Note (Signed)
Addended by: [name] on: 01/11/2020 11:08 AM   Modules accepted: Orders

## 2020-01-11 NOTE — Telephone Encounter (Signed)
Pt's mother called asking for the nurse. Her daughter (patient) is still having problems with nausea, no appetite, abd pain. She is thinking it's her gallbladder and doesn't want to go to the ER again. [phone]

## 2020-01-14 ENCOUNTER — Telehealth: Payer: Self-pay | Admitting: Nurse Practitioner

## 2020-01-14 NOTE — Telephone Encounter (Signed)
Noted  

## 2020-01-14 NOTE — Telephone Encounter (Signed)
Please see HIDA result note erroneously sent to [facility] to give to patient.

## 2020-01-14 NOTE — Telephone Encounter (Signed)
Please see result note for HIDA scan done recently that I erroneously sent to [facility] in error

## 2020-01-31 ENCOUNTER — Telehealth: Payer: Self-pay | Admitting: Gastroenterology

## 2020-01-31 NOTE — Telephone Encounter (Signed)
Need referral from pcp., appt made.

## 2020-02-04 NOTE — Assessment & Plan Note (Signed)
Noted upper abdominal pain with nausea and vomiting.  Per discussion with the patient, I feel her symptoms are generally due to exacerbated GERD.  However, she is questioning possible gallbladder etiology.  I will check a HIDA scan.  Further recommendations to follow.  Follow-up in 4 weeks.  I feel her abdominal pain will likely improve he can get her GERD and constipation with better control.

## 2020-02-04 NOTE — Assessment & Plan Note (Signed)
The patient symptoms began with worsening GERD/esophageal burning there are no bitter taste.  Used to be more morning times but now throughout the day with triggers including spicy foods and chocolate with postprandial nausea and intermittent/rare vomiting.  Not currently on any NSAIDs but to "lot of NSAIDs previously due to headaches, although last dose was a few months ago.  Was previously on a PPI but this was changed to famotidine.  ER noted addition of PPI but she is not sure if she has been taking it/if she got it.  At this point recommended Protonix 40 mg twice daily.  Continue Pepcid as needed for breakthrough symptoms.  Follow-up in 4 weeks.

## 2020-02-04 NOTE — Assessment & Plan Note (Signed)
Notes constipation with her last bowel movement 4 days ago and previously was having 1 a day.  The started becoming worse about 2 weeks ago and has been having hard stools and straining.  Only drinks 1-2 bottles of water a day, but does drink a lot of Gatorade and Pedialyte.  Has had dark stools in the last couple weeks not on iron or Pepto.  No overt hematochezia.  At this point I will have her start Colace 100 mg daily, MiraLAX once a day as needed, increase fluids, Hemoccult test.  Follow-up in 4 weeks.  Further recommendations to follow.

## 2020-02-04 NOTE — Assessment & Plan Note (Signed)
Postprandial nausea and rare/occasional vomiting in the setting of worsening GERD symptoms.  Further GERD management as per above which will likely make a impact on her nausea symptoms.  She states that Zofran ODT helped her nausea sometimes.  At this point I think we can get a better handle on her GERD and constipation her nausea symptoms will improve.  Follow-up in 4 weeks.

## 2020-02-06 ENCOUNTER — Ambulatory Visit: Payer: Commercial Managed Care - PPO | Admitting: Nurse Practitioner

## 2020-03-13 ENCOUNTER — Encounter (INDEPENDENT_AMBULATORY_CARE_PROVIDER_SITE_OTHER): Payer: Self-pay

## 2020-03-13 ENCOUNTER — Encounter: Payer: Self-pay | Admitting: Gastroenterology

## 2020-03-13 ENCOUNTER — Other Ambulatory Visit: Payer: Self-pay

## 2020-03-13 ENCOUNTER — Ambulatory Visit (INDEPENDENT_AMBULATORY_CARE_PROVIDER_SITE_OTHER): Payer: Commercial Managed Care - PPO | Admitting: Gastroenterology

## 2020-03-13 VITALS — BP 125/84 | HR 94 | Temp 97.5°F | Ht 62.0 in | Wt 156.0 lb

## 2020-03-13 DIAGNOSIS — R112 Nausea with vomiting, unspecified: Secondary | ICD-10-CM | POA: Diagnosis not present

## 2020-03-13 DIAGNOSIS — R1084 Generalized abdominal pain: Secondary | ICD-10-CM | POA: Diagnosis not present

## 2020-03-13 NOTE — Progress Notes (Signed)
Annette Gould [address]  [address]  [place], [place] [address]  Main: [phone]  Fax: [phone]   Gastroenterology Consultation  Referring Provider:     [name], MD Primary Care Physician:  [name], MD Reason for Consultation:     Nausea, vomiting, heartburn, alternating bowel habits        HPI:    Chief Complaint  Patient presents with   New Patient (Initial Visit)   Gastroesophageal Reflux    Patient is here for a second opinion.    Annette Gould is a 19 y.o. y/o female referred for consultation & management  by Dr. [name], MD.  Patient reports 2 to 69-month history of heartburn, nausea vomiting, and bowel movements alternating between constipation and diarrhea.  Saw another GI and was started on PPI, and with this her nausea vomiting and heartburn is much better.  She has not vomited in 3 weeks.  No hematemesis.  Takes an over-the-counter stool softener.  MiraLAX was previously recommended but she never took this.  Has 1 week where she has constipation, followed by another week of diarrhea.  No blood in stool.  No immediate family members with colon cancer.  No prior EGD or colonoscopy.  Denies any dysphagia.  No weight loss.  Past Medical History:  Diagnosis Date   GERD (gastroesophageal reflux disease)    Known health problems: none     Past Surgical History:  Procedure Laterality Date   DENTAL SURGERY     TYMPANOSTOMY TUBE PLACEMENT      Prior to Admission medications   Medication Sig Start Date End Date Taking? Authorizing Provider  AUROVELA FE 1.5/30 1.5-30 MG-MCG tablet Take 1 tablet by mouth daily. 11/12/19  Yes [provider]  ondansetron (ZOFRAN ODT) 4 MG disintegrating tablet Take 1 tablet (4 mg total) by mouth every 8 (eight) hours as needed for nausea or vomiting. 12/28/19  Yes Idol, Raynelle Fanning, PA-C  pantoprazole (PROTONIX) 40 MG tablet Take 1 tablet (40 mg total) by mouth 2 (two) times daily before a meal.  01/01/20  Yes Anice Paganini, NP  sertraline (ZOLOFT) 50 MG tablet Take 75 mg by mouth daily. 11/12/19  Yes [provider]  ondansetron (ZOFRAN) 4 MG tablet Take 1 tablet (4 mg total) by mouth every 6 (six) hours as needed for nausea or vomiting. Patient not taking: Reported on 03/13/2020 01/11/20   [name], PA-C  propranolol (INDERAL) 10 MG tablet Take 10 mg by mouth daily. Patient not taking: Reported on 03/13/2020 11/12/19   [provider]  famotidine (PEPCID) 20 MG tablet Take 1 tablet (20 mg total) by mouth 2 (two) times daily. 12/28/19 01/01/20  [name], PA-C    Family History  Problem Relation Age of Onset   Gastric cancer Maternal Grandfather        "not sure where, but somewhere in the stomach area"   Colon cancer Neg Hx      Social History   Tobacco Use   Smoking status: Former Smoker    Types: Cigarettes   Smokeless tobacco: Never Used  Building services engineer Use: Never used  Substance Use Topics   Alcohol use: Not Currently   Drug use: Not Currently    Types: Marijuana    Comment: denies 01/01/20    Allergies as of 03/13/2020   (No Known Allergies)    Review of Systems:    All systems reviewed and negative except where noted in  HPI.   Physical Exam:  BP 125/84    Pulse 94    Temp (!) 97.5 F (36.4 C) (Oral)    Ht 5\' 2"  (1.575 m)    Wt 156 lb (70.8 kg)    BMI 28.53 kg/m  No LMP recorded. (Menstrual status: Oral contraceptives). Psych:  Alert and cooperative. Normal mood and affect. General:   Alert,  Well-developed, well-nourished, pleasant and cooperative in NAD Head:  Normocephalic and atraumatic. Eyes:  Sclera clear, no icterus.   Conjunctiva pink. Ears:  Normal auditory acuity. Nose:  No deformity, discharge, or lesions. Mouth:  No deformity or lesions,oropharynx pink & moist. Neck:  Supple; no masses or thyromegaly. Abdomen:  Normal bowel sounds.  No bruits.  Soft, non-tender and non-distended without masses, hepatosplenomegaly  or hernias noted.  No guarding or rebound tenderness.    Msk:  Symmetrical without gross deformities. Good, equal movement & strength bilaterally. Pulses:  Normal pulses noted. Extremities:  No clubbing or edema.  No cyanosis. Neurologic:  Alert and oriented x3;  grossly normal neurologically. Skin:  Intact without significant lesions or rashes. No jaundice. Lymph Nodes:  No significant cervical adenopathy. Psych:  Alert and cooperative. Normal mood and affect.   Labs: CBC    Component Value Date/Time   WBC 11.2 (H) 12/28/2019 0806   RBC 5.30 (H) 12/28/2019 0806   HGB 14.3 12/28/2019 0806   HCT 42.4 12/28/2019 0806   PLT 330 12/28/2019 0806   MCV 80.0 12/28/2019 0806   MCH 27.0 12/28/2019 0806   MCHC 33.7 12/28/2019 0806   RDW 12.1 12/28/2019 0806   LYMPHSABS 1.6 12/28/2019 0806   MONOABS 0.7 12/28/2019 0806   EOSABS 0.1 12/28/2019 0806   BASOSABS 0.1 12/28/2019 0806   CMP     Component Value Date/Time   NA 137 12/28/2019 0806   K 3.7 12/28/2019 0806   CL 104 12/28/2019 0806   CO2 23 12/28/2019 0806   GLUCOSE 109 (H) 12/28/2019 0806   BUN 11 12/28/2019 0806   CREATININE 0.73 12/28/2019 0806   CALCIUM 9.2 12/28/2019 0806   PROT 8.0 12/28/2019 0806   ALBUMIN 4.4 12/28/2019 0806   AST 18 12/28/2019 0806   ALT 16 12/28/2019 0806   ALKPHOS 60 12/28/2019 0806   BILITOT 0.4 12/28/2019 0806   GFRNONAA >60 12/28/2019 0806   GFRAA >60 12/28/2019 0806    Imaging Studies: No results found.  Assessment and Plan:   [name] is a 19 y.o. y/o female has been referred for nausea, vomiting, reflux, alternating constipation and diarrhea  PPI has helped her symptoms and therefore they were all likely related to acid reflux Labs reassuring and no alarm symptoms present  Patient educated extensively on acid reflux lifestyle modification, including buying a bed wedge, not eating 3 hrs before bedtime, diet modifications, and handout given for the same.   Continue PPI for 2  months and then decrease or discontinue based on clinical symptoms  (Risks of PPI use were discussed with patient including bone loss, C. Diff diarrhea, pneumonia, infections, CKD, electrolyte abnormalities.  Pt. Verbalizes understanding and chooses to continue the medication.)  Start Metamucil to prevent constipation also help bulk stool when she is having loose bowel movements  Obtain H. pylori testing  Follow-up in 4 to 6 weeks to reassess symptoms and order further studies if necessary   Dr Vonda Antigua  Speech recognition software was used to dictate the above note.

## 2020-03-13 NOTE — Patient Instructions (Signed)
Psyllium granules or powder for solution What is this medicine? PSYLLIUM (SIL i yum) is a bulk-forming fiber laxative. This medicine is used to treat constipation. Increasing fiber in the diet may also help lower cholesterol and promote heart health for some people. This medicine may be used for other purposes; ask your health care provider or pharmacist if you have questions. COMMON BRAND NAME(S): Fiber Therapy, GenFiber, Geri-Mucil, Hydrocil, Konsyl, Metamucil, Metamucil MultiHealth, Mucilin, Natural Fiber Therapy, Reguloid What should I tell my health care provider before I take this medicine? They need to know if you have any of these conditions:  blockage in your bowel  difficulty swallowing  inflammatory bowel disease  phenylketonuria  stomach or intestine problems  sudden change in bowel habits lasting more than 2 weeks  an unusual or allergic reaction to psyllium, other medicines, dyes, or preservatives  pregnant or trying or get pregnant  breast-feeding How should I use this medicine? Mix this medicine into a full glass (240 mL) of water or other cool drink. Take this medicine by mouth. Follow the directions on the package labeling, or take as directed by your health care professional. Take your medicine at regular intervals. Do not take your medicine more often than directed. Talk to your pediatrician regarding the use of this medicine in children. While this drug may be prescribed for children as young as 6 years old for selected conditions, precautions do apply. Overdosage: If you think you have taken too much of this medicine contact a poison control center or emergency room at once. NOTE: This medicine is only for you. Do not share this medicine with others. What if I miss a dose? If you miss a dose, take it as soon as you can. If it is almost time for your next dose, take only that dose. Do not take double or extra doses. What may interact with this  medicine? Interactions are not expected. Take this product at least 2 hours before or after other medicines. This list may not describe all possible interactions. Give your health care provider a list of all the medicines, herbs, non-prescription drugs, or dietary supplements you use. Also tell them if you smoke, drink alcohol, or use illegal drugs. Some items may interact with your medicine. What should I watch for while using this medicine? Check with your doctor or health care professional if your symptoms do not start to get better or if they get worse. Stop using this medicine and contact your doctor or health care professional if you have rectal bleeding or if you have to treat your constipation for more than 1 week. These could be signs of a more serious condition. Drink several glasses of water a day while you are taking this medicine. This will help to relieve constipation and prevent dehydration. What side effects may I notice from receiving this medicine? Side effects that you should report to your doctor or health care professional as soon as possible:  allergic reactions like skin rash, itching or hives, swelling of the face, lips, or tongue  breathing problems  chest pain  nausea, vomiting  rectal bleeding  trouble swallowing Side effects that usually do not require medical attention (report to your doctor or health care professional if they continue or are bothersome):  bloating  gas  stomach cramps This list may not describe all possible side effects. Call your doctor for medical advice about side effects. You may report side effects to FDA at 1-800-FDA-1088. Where should I keep my medicine?   Keep out of the reach of children. Store at room temperature between 15 and 30 degrees C (59 and 86 degrees F). Protect from moisture. Throw away any unused medicine after the expiration date. NOTE: This sheet is a summary. It may not cover all possible information. If you have  questions about this medicine, talk to your doctor, pharmacist, or health care provider.  2020 Elsevier/Gold Standard (2018-02-14 15:41:08)  

## 2020-03-18 ENCOUNTER — Encounter: Payer: Self-pay | Admitting: Gastroenterology

## 2020-03-18 LAB — H PYLORI, IGM, IGG, IGA AB
H pylori, IgM Abs: <9 units (ref 0.0–8.9)
H. pylori, IgA Abs: <9 units (ref 0.0–8.9)
H. pylori, IgG AbS: 0.05 Index Value (ref 0.00–0.79)

## 2020-03-27 ENCOUNTER — Encounter: Payer: Self-pay | Admitting: Gastroenterology

## 2020-03-27 ENCOUNTER — Other Ambulatory Visit: Payer: Self-pay

## 2020-03-27 ENCOUNTER — Telehealth: Payer: Self-pay

## 2020-03-27 ENCOUNTER — Ambulatory Visit (INDEPENDENT_AMBULATORY_CARE_PROVIDER_SITE_OTHER): Payer: Commercial Managed Care - PPO | Admitting: Gastroenterology

## 2020-03-27 VITALS — BP 134/97 | HR 76 | Temp 98.3°F | Wt 153.4 lb

## 2020-03-27 DIAGNOSIS — R112 Nausea with vomiting, unspecified: Secondary | ICD-10-CM

## 2020-03-27 NOTE — Telephone Encounter (Signed)
Pasty Spillers, MD  You 45 minutes ago (9:26 AM)   If her tonsils are swollen this needs to be evaluated by her PCP. Please have her call them. We can see her in clinic for the other symptoms and decide if she needs an EGD and colonoscopy. Ok to [employer] for next week   Message text

## 2020-03-27 NOTE — Progress Notes (Signed)
Annette Bouillon, MD [address]  [address]  [place], [place] [address]  Main: [phone]  Fax: [phone]   Primary Care Physician: [name], MD   Chief Complaint  Patient presents with  . Emesis    Patient continues to have nausea and vomiting.    HPI: Annette Gould is a 19 y.o. female presents for follow-up of above symptoms.  Continues to have nausea and vomiting despite PPI.  Today she admits to using marijuana intermittently.  Last use 3 weeks ago.  No dysphagia.  Current Outpatient Medications  Medication Sig Dispense Refill  . AUROVELA FE 1.5/30 1.5-30 MG-MCG tablet Take 1 tablet by mouth daily.    . ondansetron (ZOFRAN ODT) 4 MG disintegrating tablet Take 1 tablet (4 mg total) by mouth every 8 (eight) hours as needed for nausea or vomiting. 20 tablet 0  . pantoprazole (PROTONIX) 40 MG tablet Take 1 tablet (40 mg total) by mouth 2 (two) times daily before a meal. 60 tablet 3  . propranolol (INDERAL) 10 MG tablet Take 10 mg by mouth daily.     . sertraline (ZOLOFT) 50 MG tablet Take 75 mg by mouth daily.     No current facility-administered medications for this visit.    Allergies as of 03/27/2020  . (No Known Allergies)    ROS:  General: Negative for anorexia, weight loss, fever, chills, fatigue, weakness. ENT: Negative for hoarseness, difficulty swallowing , nasal congestion. CV: Negative for chest pain, angina, palpitations, dyspnea on exertion, peripheral edema.  Respiratory: Negative for dyspnea at rest, dyspnea on exertion, cough, sputum, wheezing.  GI: See history of present illness. GU:  Negative for dysuria, hematuria, urinary incontinence, urinary frequency, nocturnal urination.  Endo: Negative for unusual weight change.    Physical Examination:   BP (!) 134/97   Pulse 76   Temp 98.3 F (36.8 C) (Oral)   Wt 153 lb 6.4 oz (69.6 kg)   BMI 28.06 kg/m   General: Well-nourished, well-developed in no acute distress.  Eyes: No  icterus. Conjunctivae pink. Mouth: Oropharyngeal mucosa moist and pink , no lesions erythema or exudate. Neck: Supple, Trachea midline Abdomen: Bowel sounds are normal, nontender, nondistended, no hepatosplenomegaly or masses, no abdominal bruits or hernia , no rebound or guarding.   Extremities: No lower extremity edema. No clubbing or deformities. Neuro: Alert and oriented x 3.  Grossly intact. Skin: Warm and dry, no jaundice.   Psych: Alert and cooperative, normal mood and affect.   Labs: CMP     Component Value Date/Time   NA 137 12/28/2019 0806   K 3.7 12/28/2019 0806   CL 104 12/28/2019 0806   CO2 23 12/28/2019 0806   GLUCOSE 109 (H) 12/28/2019 0806   BUN 11 12/28/2019 0806   CREATININE 0.73 12/28/2019 0806   CALCIUM 9.2 12/28/2019 0806   PROT 8.0 12/28/2019 0806   ALBUMIN 4.4 12/28/2019 0806   AST 18 12/28/2019 0806   ALT 16 12/28/2019 0806   ALKPHOS 60 12/28/2019 0806   BILITOT 0.4 12/28/2019 0806   GFRNONAA >60 12/28/2019 0806   GFRAA >60 12/28/2019 0806   Lab Results  Component Value Date   WBC 11.2 (H) 12/28/2019   HGB 14.3 12/28/2019   HCT 42.4 12/28/2019   MCV 80.0 12/28/2019   PLT 330 12/28/2019    Imaging Studies: No results found.  Assessment and Plan:   Annette Gould is a 19 y.o. y/o female here for follow-up of nausea and vomiting  Due to persistent symptoms, proceed with EGD to rule out obstructive lesions Patient advised to abstain from marijuana use and educated about cyclical vomiting syndrome  I have discussed alternative options, risks & benefits,  which include, but are not limited to, bleeding, infection, perforation,respiratory complication & drug reaction.  The patient agrees with this plan & written consent will be obtained.       Dr Vonda Antigua

## 2020-03-27 NOTE — Telephone Encounter (Signed)
Patient's mother-Deirdre called stating that her continues to have some vomiting with blood three times, tonsils are swollen, can't keep anything down because it comes back up and terrible heartburn. Deirdre stated that she told her daughter to go to the ED but she refused. Patient is still taking Zofran and Pantoprazole but it has not helped her. Deirdre wants to know what could be done with her daughter. Please advise.

## 2020-03-27 NOTE — Telephone Encounter (Signed)
Patient's mother stated that the reason her daughter feels that her tonsils are swollen is because she had been vomiting. However, she agreed for her daughter to be seen. Patient will come in today and be seen since her mother insisted to be seen soon instead of waiting until July 7 th.

## 2020-03-27 NOTE — Patient Instructions (Signed)
Cannabinoid Hyperemesis Syndrome Cannabinoid hyperemesis syndrome (CHS) is a condition that causes repeated nausea, vomiting, and abdominal pain after long-term (chronic) use of marijuana (cannabis). People with CHS typically use marijuana 3-5 times a day for many years before they have symptoms, although it is possible to develop CHS with as little as 1 use per day. Symptoms of CHS may be mild at first but can get worse and more frequent. In some cases, CHS may cause vomiting many times a day, which can lead to weight loss and dehydration. CHS may go away and come back many times (recur). People may not have symptoms or may otherwise be healthy in between CHS attacks. What are the causes? The exact cause of this condition is not known. Long-term use of marijuana may over-stimulate certain proteins in the brain that react with chemicals in marijuana (cannabinoid receptors). This over-stimulation may cause CHS. What are the signs or symptoms? Symptoms of this condition are often mild during the first few attacks, but they can get worse over time. Symptoms may include:  Frequent nausea, especially early in the morning.  Vomiting.  Abdominal pain. Taking several hot showers throughout the day can also be a sign of this condition. People with CHS may do this because it relieves symptoms. How is this diagnosed? This condition may be diagnosed based on:  Your symptoms and medical history, including any drug use.  A physical exam. You may have tests done to rule out other problems. These tests may include:  Blood tests.  Urine tests.  Imaging tests, such as an X-ray or CT scan. How is this treated? Treatment for this condition involves stopping marijuana use. Your health care provider may recommend:  A drug rehabilitation program, if you have trouble stopping marijuana use.  Medicines for nausea.  Hot showers to help relieve symptoms. Certain creams that contain a substance called  capsaicin may improve symptoms when applied to the abdomen. Ask your health care provider before starting any medicines or other treatments. Severe nausea and vomiting may require you to stay at the hospital. You may need IV fluids to prevent or treat dehydration. You may also need certain medicines that must be given at the hospital. Follow these instructions at home: During an attack   Stay in bed and rest in a dark, quiet room.  Take anti-nausea medicine as told by your health care provider.  Try taking hot showers to relieve your symptoms. After an attack  Drink small amounts of clear fluids slowly. Gradually add more.  Once you are able to eat without vomiting, eat soft foods in small amounts every 3-4 hours. General instructions   Do not use any products that contain marijuana.If you need help quitting, ask your health care provider for resources and treatment options.  Drink enough fluid to keep your urine pale yellow. Avoid drinking fluids that have a lot of sugar or caffeine, such as coffee and soda.  Take and apply over-the-counter and prescription medicines only as told by your health care provider. Ask your health care provider before starting any new medicines or treatments.  Keep all follow-up visits as told by your health care provider. This is important. Contact a health care provider if:  Your symptoms get worse.  You cannot drink fluids without vomiting.  You have pain and trouble swallowing after an attack. Get help right away if:  You cannot stop vomiting.  You have blood in your vomit or your vomit looks like coffee grounds.  You have   severe abdominal pain.  You have stools that are bloody or black, or stools that look like tar.  You have symptoms of dehydration, such as: ? Sunken eyes. ? Inability to make tears. ? Cracked lips. ? Dry mouth. ? Decreased urine production. ? Weakness. ? Sleepiness. ? Fainting. Summary  Cannabinoid hyperemesis  syndrome (CHS) is a condition that causes repeated nausea, vomiting, and abdominal pain after long-term use of marijuana.  People with CHS typically use marijuana 3-5 times a day for many years before they have symptoms, although it is possible to develop CHS with as little as 1 use per day.  Treatment for this condition involves stopping marijuana use. Hot showers and capsaicin creams may also help relieve symptoms. Ask your health care provider before starting any medicines or other treatments.  Your health care provider may prescribe medicines to help with nausea.  Get help right away if you have signs of dehydration, such as dry mouth, decreased urine production, or weakness. This information is not intended to replace advice given to you by your health care provider. Make sure you discuss any questions you have with your health care provider. Document Revised: 01/27/2018 Document Reviewed: 12/29/2016 Elsevier Patient Education  2020 Elsevier Inc.  

## 2020-04-08 ENCOUNTER — Other Ambulatory Visit: Payer: Self-pay | Admitting: Gastroenterology

## 2020-04-09 ENCOUNTER — Telehealth: Payer: Self-pay | Admitting: Nurse Practitioner

## 2020-04-09 NOTE — Telephone Encounter (Signed)
Reviewed chart. Patient was being seen by Annette Gould, with f/u scheduled for 02/06/20. Follow-up was cancelled, patient sought referral to Annette Gould. She has seen them twice, and has EGD scheduled for next week.  Cc: CM and RMR query d/c from practice, receiving care elsewhere.

## 2020-04-10 ENCOUNTER — Telehealth: Payer: Self-pay

## 2020-04-10 ENCOUNTER — Encounter: Payer: Self-pay | Admitting: Gastroenterology

## 2020-04-10 ENCOUNTER — Encounter: Payer: Self-pay | Admitting: General Practice

## 2020-04-10 ENCOUNTER — Other Ambulatory Visit: Payer: Self-pay

## 2020-04-10 MED ORDER — ONDANSETRON 4 MG PO TBDP: 4.0000 mg | ORAL_TABLET | Freq: Three times a day (TID) | ORAL | 0 refills | Status: DC | PRN

## 2020-04-10 NOTE — Telephone Encounter (Signed)
Patient's mom-Mrs. Annette Gould wanting for me to call [facility] in [place] so I could schedule her daughter COVID-19 Test for tomorrow since she will be having a procedure done on Tuesday 04/15/2020. She would also want a prescription sent for nausea since her daughter continues to be nauseous.  [facility]nette [facility]: [phone]   I then called Annette Gould and they had closed at 3:00 PM. I will call them again tomorrow.

## 2020-04-10 NOTE — Telephone Encounter (Signed)
Noted  

## 2020-04-10 NOTE — Telephone Encounter (Signed)
Discharge letter mailed  

## 2020-04-10 NOTE — Telephone Encounter (Signed)
OK:  I guess by default she has severed relationship with [place].  Lets make it official with letter

## 2020-04-11 ENCOUNTER — Other Ambulatory Visit: Admission: RE | Admit: 2020-04-11 | Payer: Commercial Managed Care - PPO | Source: Ambulatory Visit

## 2020-04-11 ENCOUNTER — Other Ambulatory Visit (HOSPITAL_COMMUNITY)
Admission: RE | Admit: 2020-04-11 | Discharge: 2020-04-11 | Disposition: A | Discharge status: Home / Self Care | Payer: Commercial Managed Care - PPO | Source: Ambulatory Visit | Attending: Gastroenterology | Admitting: Gastroenterology

## 2020-04-11 DIAGNOSIS — Z20822 Contact with and (suspected) exposure to covid-19: Secondary | ICD-10-CM | POA: Diagnosis not present

## 2020-04-11 DIAGNOSIS — Z01812 Encounter for preprocedural laboratory examination: Secondary | ICD-10-CM | POA: Diagnosis present

## 2020-04-11 LAB — SARS CORONAVIRUS 2 (TAT 6-24 HRS): SARS Coronavirus 2: NEGATIVE

## 2020-04-11 NOTE — Telephone Encounter (Signed)
Called [facility] and was able to schedule patient's COVID-19 test today at 1:00 PM. I then called patient's mother and patient and left them both a voicemail since they did not answer my call letting them know that they need to show up at 1 PM today. I told them to call Jeani Hawking or myself if they had any questions.

## 2020-04-14 NOTE — Discharge Instructions (Signed)
General Anesthesia, Adult, Care After This sheet gives you information about how to care for yourself after your procedure. Your health care provider may also give you more specific instructions. If you have problems or questions, contact your health care provider. What can I expect after the procedure? After the procedure, the following side effects are common:  Pain or discomfort at the IV site.  Nausea.  Vomiting.  Sore throat.  Trouble concentrating.  Feeling cold or chills.  Weak or tired.  Sleepiness and fatigue.  Soreness and body aches. These side effects can affect parts of the body that were not involved in surgery. Follow these instructions at home:  For at least 24 hours after the procedure:  Have a responsible adult stay with you. It is important to have someone help care for you until you are awake and alert.  Rest as needed.  Do not: ? Participate in activities in which you could fall or become injured. ? Drive. ? Use heavy machinery. ? Drink alcohol. ? Take sleeping pills or medicines that cause drowsiness. ? Make important decisions or sign legal documents. ? Take care of children on your own. Eating and drinking  Follow any instructions from your health care provider about eating or drinking restrictions.  When you feel hungry, start by eating small amounts of foods that are soft and easy to digest (bland), such as toast. Gradually return to your regular diet.  Drink enough fluid to keep your urine pale yellow.  If you vomit, rehydrate by drinking water, juice, or clear broth. General instructions  If you have sleep apnea, surgery and certain medicines can increase your risk for breathing problems. Follow instructions from your health care provider about wearing your sleep device: ? Anytime you are sleeping, including during daytime naps. ? While taking prescription pain medicines, sleeping medicines, or medicines that make you drowsy.  Return to  your normal activities as told by your health care provider. Ask your health care provider what activities are safe for you.  Take over-the-counter and prescription medicines only as told by your health care provider.  If you smoke, do not smoke without supervision.  Keep all follow-up visits as told by your health care provider. This is important. Contact a health care provider if:  You have nausea or vomiting that does not get better with medicine.  You cannot eat or drink without vomiting.  You have pain that does not get better with medicine.  You are unable to pass urine.  You develop a skin rash.  You have a fever.  You have redness around your IV site that gets worse. Get help right away if:  You have difficulty breathing.  You have chest pain.  You have blood in your urine or stool, or you vomit blood. Summary  After the procedure, it is common to have a sore throat or nausea. It is also common to feel tired.  Have a responsible adult stay with you for the first 24 hours after general anesthesia. It is important to have someone help care for you until you are awake and alert.  When you feel hungry, start by eating small amounts of foods that are soft and easy to digest (bland), such as toast. Gradually return to your regular diet.  Drink enough fluid to keep your urine pale yellow.  Return to your normal activities as told by your health care provider. Ask your health care provider what activities are safe for you. This information is not   intended to replace advice given to you by your health care provider. Make sure you discuss any questions you have with your health care provider. Document Revised: 09/23/2017 Document Reviewed: 05/06/2017 Elsevier Patient Education  2020 Elsevier Inc.  

## 2020-04-15 ENCOUNTER — Ambulatory Visit: Payer: Commercial Managed Care - PPO | Admitting: Anesthesiology

## 2020-04-15 ENCOUNTER — Encounter
Admission: RE | Disposition: A | Discharge status: Home / Self Care | Payer: Self-pay | Source: Home / Self Care | Attending: Gastroenterology

## 2020-04-15 ENCOUNTER — Other Ambulatory Visit: Payer: Self-pay | Admitting: Gastroenterology

## 2020-04-15 ENCOUNTER — Encounter: Payer: Self-pay | Admitting: Gastroenterology

## 2020-04-15 ENCOUNTER — Ambulatory Visit
Admission: RE | Admit: 2020-04-15 | Discharge: 2020-04-15 | Disposition: A | Discharge status: Home / Self Care | Payer: Commercial Managed Care - PPO | Attending: Gastroenterology | Admitting: Gastroenterology

## 2020-04-15 DIAGNOSIS — Z79899 Other long term (current) drug therapy: Secondary | ICD-10-CM | POA: Insufficient documentation

## 2020-04-15 DIAGNOSIS — K219 Gastro-esophageal reflux disease without esophagitis: Secondary | ICD-10-CM | POA: Diagnosis not present

## 2020-04-15 DIAGNOSIS — K222 Esophageal obstruction: Secondary | ICD-10-CM | POA: Insufficient documentation

## 2020-04-15 DIAGNOSIS — Z87891 Personal history of nicotine dependence: Secondary | ICD-10-CM | POA: Insufficient documentation

## 2020-04-15 DIAGNOSIS — R112 Nausea with vomiting, unspecified: Secondary | ICD-10-CM | POA: Diagnosis not present

## 2020-04-15 DIAGNOSIS — G43909 Migraine, unspecified, not intractable, without status migrainosus: Secondary | ICD-10-CM | POA: Diagnosis not present

## 2020-04-15 DIAGNOSIS — K2289 Other specified disease of esophagus: Secondary | ICD-10-CM

## 2020-04-15 DIAGNOSIS — K3189 Other diseases of stomach and duodenum: Secondary | ICD-10-CM | POA: Diagnosis not present

## 2020-04-15 HISTORY — PX: ESOPHAGOGASTRODUODENOSCOPY (EGD) WITH PROPOFOL: SHX5813

## 2020-04-15 HISTORY — DX: Bursopathy, unspecified: M71.9

## 2020-04-15 HISTORY — PX: BIOPSY: SHX5522

## 2020-04-15 HISTORY — DX: Migraine, unspecified, not intractable, without status migrainosus: G43.909

## 2020-04-15 LAB — POCT PREGNANCY, URINE: Preg Test, Ur: NEGATIVE

## 2020-04-15 SURGERY — ESOPHAGOGASTRODUODENOSCOPY (EGD) WITH PROPOFOL
Anesthesia: General | Site: Throat

## 2020-04-15 MED ORDER — PROPOFOL 10 MG/ML IV BOLUS
INTRAVENOUS | Status: DC | PRN
  Administered 2020-04-15: 90 mg via INTRAVENOUS
  Administered 2020-04-15: 40 mg via INTRAVENOUS
  Administered 2020-04-15: 30 mg via INTRAVENOUS
  Administered 2020-04-15: 10 mg via INTRAVENOUS
  Administered 2020-04-15: 40 mg via INTRAVENOUS
  Administered 2020-04-15 (×2): 30 mg via INTRAVENOUS

## 2020-04-15 MED ORDER — SODIUM CHLORIDE 0.9 % IV SOLN: INTRAVENOUS | Status: DC

## 2020-04-15 MED ORDER — LIDOCAINE HCL (CARDIAC) PF 100 MG/5ML IV SOSY
PREFILLED_SYRINGE | INTRAVENOUS | Status: DC | PRN
  Administered 2020-04-15: 40 mg via INTRAVENOUS

## 2020-04-15 MED ORDER — GLYCOPYRROLATE 0.2 MG/ML IJ SOLN
INTRAMUSCULAR | Status: DC | PRN
  Administered 2020-04-15: .1 mg via INTRAVENOUS

## 2020-04-15 MED ORDER — LACTATED RINGERS IV SOLN: INTRAVENOUS | Status: DC

## 2020-04-15 MED ORDER — STERILE WATER FOR IRRIGATION IR SOLN
Status: DC | PRN
  Administered 2020-04-15: 10 mL

## 2020-04-15 SURGICAL SUPPLY — 33 items
BALLN DILATOR 10-12 8 (BALLOONS)
BALLN DILATOR 12-15 8 (BALLOONS)
BALLN DILATOR 15-18 8 (BALLOONS)
BALLN DILATOR CRE 0-12 8 (BALLOONS)
BALLN DILATOR ESOPH 8 10 CRE (MISCELLANEOUS) IMPLANT
BALLOON DILATOR 12-15 8 (BALLOONS) IMPLANT
BALLOON DILATOR 15-18 8 (BALLOONS) IMPLANT
BALLOON DILATOR CRE 0-12 8 (BALLOONS) IMPLANT
BLOCK BITE 60FR ADLT L/F GRN (MISCELLANEOUS) ×3 IMPLANT
CLIP HMST 235XBRD CATH ROT (MISCELLANEOUS) IMPLANT
CLIP RESOLUTION 360 11X235 (MISCELLANEOUS)
ELECT REM PT RETURN 9FT ADLT (ELECTROSURGICAL)
ELECTRODE REM PT RTRN 9FT ADLT (ELECTROSURGICAL) IMPLANT
FCP ESCP3.2XJMB 240X2.8X (MISCELLANEOUS)
FORCEPS BIOP RAD 4 LRG CAP 4 (CUTTING FORCEPS) ×3 IMPLANT
FORCEPS BIOP RJ4 240 W/NDL (MISCELLANEOUS)
FORCEPS ESCP3.2XJMB 240X2.8X (MISCELLANEOUS) IMPLANT
GOWN CVR UNV OPN BCK APRN NK (MISCELLANEOUS) ×4 IMPLANT
GOWN ISOL THUMB LOOP REG UNIV (MISCELLANEOUS) ×6
INJECTOR VARIJECT VIN23 (MISCELLANEOUS) IMPLANT
KIT DEFENDO VALVE AND CONN (KITS) IMPLANT
KIT ENDO PROCEDURE OLY (KITS) ×3 IMPLANT
MANIFOLD NEPTUNE II (INSTRUMENTS) ×3 IMPLANT
MARKER SPOT ENDO TATTOO 5ML (MISCELLANEOUS) IMPLANT
RETRIEVER NET PLAT FOOD (MISCELLANEOUS) IMPLANT
SNARE SHORT THROW 13M SML OVAL (MISCELLANEOUS) IMPLANT
SNARE SHORT THROW 30M LRG OVAL (MISCELLANEOUS) IMPLANT
SPOT EX ENDOSCOPIC TATTOO (MISCELLANEOUS)
SYR INFLATION 60ML (SYRINGE) IMPLANT
TRAP ETRAP POLY (MISCELLANEOUS) IMPLANT
VARIJECT INJECTOR VIN23 (MISCELLANEOUS)
WATER STERILE IRR 250ML POUR (IV SOLUTION) ×3 IMPLANT
WIRE CRE 18-20MM 8CM F G (MISCELLANEOUS) IMPLANT

## 2020-04-15 NOTE — H&P (Signed)
[name], MD [address], [place], [place], [address] [address], [place], [place], [address] Phone: [phone]  Fax: [phone]  Primary Care Physician:  [name], MD   Pre-Procedure History & Physical: HPI:  Annette Gould is a 19 y.o. female is here for an EGD.   Past Medical History:  Diagnosis Date  . Bursitis    hips  . GERD (gastroesophageal reflux disease)   . Migraine headache    approx 2/month    Past Surgical History:  Procedure Laterality Date  . DENTAL SURGERY    . TYMPANOSTOMY TUBE PLACEMENT      Prior to Admission medications   Medication Sig Start Date End Date Taking? Authorizing Provider  AUROVELA FE 1.5/30 1.5-30 MG-MCG tablet Take 1 tablet by mouth daily. 11/12/19  Yes [provider]  pantoprazole (PROTONIX) 40 MG tablet Take 1 tablet (40 mg total) by mouth 2 (two) times daily before a meal. 01/01/20  Yes Anice Paganini, NP  propranolol (INDERAL) 10 MG tablet Take 10 mg by mouth daily.  11/12/19  Yes [provider]  sertraline (ZOLOFT) 50 MG tablet Take 75 mg by mouth daily. 11/12/19  Yes [provider]  ondansetron (ZOFRAN ODT) 4 MG disintegrating tablet Take 1 tablet (4 mg total) by mouth every 8 (eight) hours as needed for nausea or vomiting. 04/10/20   Pasty Spillers, MD  famotidine (PEPCID) 20 MG tablet Take 1 tablet (20 mg total) by mouth 2 (two) times daily. 12/28/19 01/01/20  [name], PA-C    Allergies as of 03/28/2020  . (No Known Allergies)    Family History  Problem Relation Age of Onset  . Gastric cancer Maternal Grandfather        "not sure where, but somewhere in the stomach area"  . Colon cancer Neg Hx     Social History   Socioeconomic History  . Marital status: Single    Spouse name: Not on file  . Number of children: Not on file  . Years of education: Not on file  . Highest education level: Not on file  Occupational History  . Not on file  Tobacco  Use  . Smoking status: Former Smoker    Types: Cigarettes    Quit date: 12/03/2019    Years since quitting: 0.3  . Smokeless tobacco: Never Used  . Tobacco comment: Smoked less than 1 pack per week for about 3 months  Vaping Use  . Vaping Use: Never used  Substance and Sexual Activity  . Alcohol use: Not Currently    Comment: may have 1-2 drinks/month  . Drug use: Not Currently    Types: Marijuana    Comment: denies 01/01/20  . Sexual activity: Yes  Other Topics Concern  . Not on file  Social History Narrative  . Not on file   Social Determinants of Health   Financial Resource Strain:   . Difficulty of Paying Living Expenses:   Food Insecurity:   . Worried About Programme researcher, broadcasting/film/video in the Last Year:   . Barista in the Last Year:   Transportation Needs:   . Freight forwarder (Medical):   Marland Kitchen Lack of Transportation (Non-Medical):   Physical Activity:   . Days of Exercise per Week:   . Minutes of Exercise per Session:   Stress:   . Feeling of Stress :   Social Connections:   . Frequency of Communication with Friends and Family:   .  Frequency of Social Gatherings with Friends and Family:   . Attends Religious Services:   . Active Member of Clubs or Organizations:   . Attends Banker Meetings:   Marland Kitchen Marital Status:   Intimate Partner Violence:   . Fear of Current or Ex-Partner:   . Emotionally Abused:   Marland Kitchen Physically Abused:   . Sexually Abused:     Review of Systems: See HPI, otherwise negative ROS  Physical Exam: Ht 5\' 2"  (1.575 m)   Wt 69.4 kg   LMP 03/30/2020 (Approximate)   BMI 27.98 kg/m  General:   Alert,  pleasant and cooperative in NAD Head:  Normocephalic and atraumatic. Neck:  Supple; no masses or thyromegaly. Lungs:  Clear throughout to auscultation, normal respiratory effort.    Heart:  +S1, +S2, Regular rate and rhythm, No edema. Abdomen:  Soft, nontender and nondistended. Normal bowel sounds, without guarding, and without  rebound.   Neurologic:  Alert and  oriented x4;  grossly normal neurologically.  Impression/Plan: 04/01/2020 is here for an EGD for nausea, vomiting  Risks, benefits, limitations, and alternatives regarding the procedure have been reviewed with the patient.  Questions have been answered.  All parties agreeable.   Selinda Michaels, MD  04/15/2020, 7:44 AM

## 2020-04-15 NOTE — Anesthesia Preprocedure Evaluation (Signed)
Anesthesia Evaluation  Patient identified by MRN, date of birth, ID band Patient awake    Reviewed: Allergy & Precautions, H&P , NPO status , Patient's Chart, lab work & pertinent test results, reviewed documented beta blocker date and time   Airway Mallampati: II  TM Distance: >3 FB Neck ROM: full    Dental no notable dental hx.    Pulmonary neg pulmonary ROS, former smoker,    Pulmonary exam normal breath sounds clear to auscultation       Cardiovascular Exercise Tolerance: Good negative cardio ROS   Rhythm:regular Rate:Normal     Neuro/Psych  Headaches, negative psych ROS   GI/Hepatic Neg liver ROS, GERD  ,  Endo/Other  negative endocrine ROS  Renal/GU negative Renal ROS  negative genitourinary   Musculoskeletal   Abdominal   Peds  Hematology negative hematology ROS (+)   Anesthesia Other Findings   Reproductive/Obstetrics negative OB ROS                             Anesthesia Physical Anesthesia Plan  ASA: II  Anesthesia Plan: General   Post-op Pain Management:    Induction:   PONV Risk Score and Plan: 3 and Propofol infusion, TIVA and Treatment may vary due to age or medical condition  Airway Management Planned:   Additional Equipment:   Intra-op Plan:   Post-operative Plan:   Informed Consent: I have reviewed the patients History and Physical, chart, labs and discussed the procedure including the risks, benefits and alternatives for the proposed anesthesia with the patient or authorized representative who has indicated his/her understanding and acceptance.     Dental Advisory Given  Plan Discussed with: CRNA  Anesthesia Plan Comments:         Anesthesia Quick Evaluation

## 2020-04-15 NOTE — Transfer of Care (Signed)
Immediate Anesthesia Transfer of Care Note  Patient: Annette Gould  Procedure(s) Performed: ESOPHAGOGASTRODUODENOSCOPY (EGD) WITH PROPOFOL (N/A ) BIOPSY (N/A Throat)  Patient Location: PACU  Anesthesia Type: General  Level of Consciousness: awake, alert  and patient cooperative  Airway and Oxygen Therapy: Patient Spontanous Breathing and Patient connected to supplemental oxygen  Post-op Assessment: Post-op Vital signs reviewed, Patient's Cardiovascular Status Stable, Respiratory Function Stable, Patent Airway and No signs of Nausea or vomiting  Post-op Vital Signs: Reviewed and stable  Complications: No complications documented.

## 2020-04-15 NOTE — Anesthesia Postprocedure Evaluation (Signed)
Anesthesia Post Note  Patient: AKEIBA AXELSON  Procedure(s) Performed: ESOPHAGOGASTRODUODENOSCOPY (EGD) WITH PROPOFOL (N/A ) BIOPSY (N/A Throat)     Patient location during evaluation: PACU Anesthesia Type: General Level of consciousness: awake and alert Pain management: pain level controlled Vital Signs Assessment: post-procedure vital signs reviewed and stable Respiratory status: spontaneous breathing, nonlabored ventilation, respiratory function stable and patient connected to nasal cannula oxygen Cardiovascular status: blood pressure returned to baseline and stable Postop Assessment: no apparent nausea or vomiting Anesthetic complications: no   No complications documented.  Scarlette Slice

## 2020-04-15 NOTE — Anesthesia Procedure Notes (Signed)
Performed by: Cherell Colvin, CRNA Pre-anesthesia Checklist: Patient identified, Emergency Drugs available, Suction available, Timeout performed and Patient being monitored Patient Re-evaluated:Patient Re-evaluated prior to induction Oxygen Delivery Method: Nasal cannula Placement Confirmation: positive ETCO2       

## 2020-04-15 NOTE — Op Note (Signed)
[facility] Patient Name: Annette Gould Procedure Date: 04/15/2020 8:39 AM MRN: [id number] Account #: [id number] Date of Birth: Jan 26, 2001 Admit Type: Outpatient Age: 19 Room: [facility] OR ROOM 01 Gender: Female Note Status: Finalized Procedure:             Upper GI endoscopy Indications:           Nausea with vomiting Providers:             Brinden Kincheloe B. Maximino Greenland MD, MD Referring MD:          Pam Drown, MD (Referring MD) Medicines:             Monitored Anesthesia Care Complications:         No immediate complications. Procedure:             Pre-Anesthesia Assessment:                        - Prior to the procedure, a History and Physical was                         performed, and patient medications, allergies and                         sensitivities were reviewed. The patient's tolerance                         of previous anesthesia was reviewed.                        - The risks and benefits of the procedure and the                         sedation options and risks were discussed with the                         patient. All questions were answered and informed                         consent was obtained.                        - Patient identification and proposed procedure were                         verified prior to the procedure by the physician, the                         nurse, the anesthesiologist, the anesthetist and the                         technician. The procedure was verified in the                         procedure room.                        - ASA Grade Assessment: II - A patient with mild                         systemic  disease.                        After obtaining informed consent, the endoscope was                         passed under direct vision. Throughout the procedure,                         the patient's blood pressure, pulse, and oxygen                         saturations were monitored continuously.  The was                         introduced through the mouth, and advanced to the                         third part of duodenum. The upper GI endoscopy was                         accomplished with ease. The patient tolerated the                         procedure well. Findings:      An at 24 cm from the incisors area of extrinsic compression was found in       the proximal esophagus.      The exam of the esophagus was otherwise normal.      The entire examined stomach was normal. Biopsies were obtained in the       gastric body, at the incisura and in the gastric antrum with cold       forceps for histology. Biopsies were taken with a cold forceps for       Helicobacter pylori testing.      The duodenal bulb was normal.      Patchy mild mucosal changes characterized by discoloration were found in       the second portion of the duodenum. Biopsies were taken with a cold       forceps for histology. Impression:            - Extrinsic compression in the proximal esophagus.                        - Normal stomach. Biopsied.                        - Normal duodenal bulb.                        - Mucosal changes in the duodenum. Biopsied.                        - Biopsies were obtained in the gastric body, at the                         incisura and in the gastric antrum. Recommendation:        - Await pathology results.                        -  Discharge patient to home (with escort).                        - Advance diet as tolerated.                        - Continue present medications.                        - Patient has a contact number available for                         emergencies. The signs and symptoms of potential                         delayed complications were discussed with the patient.                         Return to normal activities tomorrow. Written                         discharge instructions were provided to the patient.                        - Discharge  patient to home (with escort).                        - The findings and recommendations were discussed with                         the patient.                        - The findings and recommendations were discussed with                         the patient's family. Procedure Code(s):     --- Professional ---                        [id number], Esophagogastroduodenoscopy, flexible,                         transoral; with biopsy, single or multiple Diagnosis Code(s):     --- Professional ---                        K22.2, Esophageal obstruction                        K31.89, Other diseases of stomach and duodenum                        R11.2, Nausea with vomiting, unspecified CPT copyright 2019 [employer] Association. All rights reserved. The codes documented in this report are preliminary and upon coder review may  be revised to meet current compliance requirements.  [name], MD Michel Bickers B. Maximino Greenland MD, MD 04/15/2020 9:04:25 AM This report has been signed electronically. Number of Addenda: 0 Note Initiated On: 04/15/2020 8:39 AM Total Procedure Duration: 0 hours 7 minutes 15 seconds  Estimated Blood Loss:  Estimated blood loss: none.  [facility]

## 2020-04-16 LAB — SURGICAL PATHOLOGY

## 2020-04-17 ENCOUNTER — Encounter: Payer: Self-pay | Admitting: Gastroenterology

## 2020-04-17 ENCOUNTER — Telehealth: Payer: Self-pay

## 2020-04-17 LAB — PATHOLOGY

## 2020-04-17 NOTE — Telephone Encounter (Signed)
-----   Message from Pasty Spillers, MD sent at 04/17/2020  8:35 AM EDT ----- [name] please let the patient know, her biopsies were benign with no concerning changes seen. Please set up phone appointment with me to discuss next steps in 1-2 weeks

## 2020-04-17 NOTE — Telephone Encounter (Signed)
Called patient and had to leave her a detailed message letting her know that Dr. Maximino Greenland reviewed her pathology report and that it was benign and that she would like to have a telephone visit with her to discuss next steps. I left patient her telephone appointment on her voicemail.

## 2020-04-21 NOTE — Addendum Note (Signed)
Addended by: [name] on: 04/21/2020 10:22 AM   Modules accepted: Orders

## 2020-04-22 ENCOUNTER — Telehealth: Payer: Self-pay

## 2020-04-22 NOTE — Telephone Encounter (Signed)
Called patient but had to leave her a voicemail letting her know that Dr. Maximino Greenland would want for her to get a CT scan to help her find the etiology of the extrinsic compression. This information will also be mailed to the patient.

## 2020-04-22 NOTE — Telephone Encounter (Signed)
-----   Message from Pasty Spillers, MD sent at 04/21/2020 10:23 AM EDT ----- [name] please let the patient know, her biopsies were benign.  I have ordered CT chest and neck, as an area of extrinsic compression was noted in her esophagus.  This is likely benign, however the CT will help to find the etiology of the extrinsic compression.

## 2020-05-08 ENCOUNTER — Telehealth: Payer: Commercial Managed Care - PPO | Admitting: Gastroenterology

## 2020-05-12 ENCOUNTER — Ambulatory Visit (HOSPITAL_COMMUNITY)
Admission: RE | Admit: 2020-05-12 | Discharge: 2020-05-12 | Disposition: A | Discharge status: Home / Self Care | Payer: Commercial Managed Care - PPO | Source: Ambulatory Visit | Attending: Gastroenterology | Admitting: Gastroenterology

## 2020-05-12 ENCOUNTER — Other Ambulatory Visit: Payer: Self-pay

## 2020-05-12 DIAGNOSIS — K2289 Other specified disease of esophagus: Secondary | ICD-10-CM

## 2020-05-12 DIAGNOSIS — K228 Other specified diseases of esophagus: Secondary | ICD-10-CM | POA: Diagnosis present

## 2020-05-12 IMAGING — CT CT NECK W/ CM
4 series · 15 of 33 positions shown, 18 images · IV contrast (Omnipaque or Isovue)
Comparison: None.

CLINICAL DATA: Extrinsic compression of the upper esophagus at
recent endoscopy. Chronic nausea and vomiting.

EXAM:
CT NECK AND CHEST WITH CONTRAST
CONTRAST:  125 cc Omnipaque 300

[Series 2: axial neck · axial · 0.54mm/px · z∈[-185,-149]mm · 2 of 109 slices shown]
[im 19/109  bone]
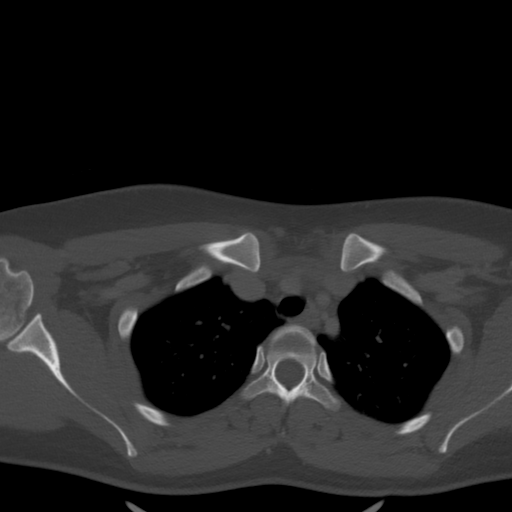
[im 37/109  bone]
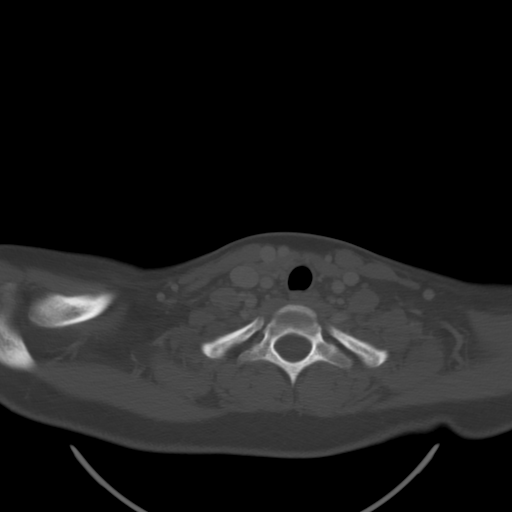

[Series 4: sag neck · sagittal · 0.42mm/px · 5 of 101 slices shown, 6 images]
[im 34/101  bone]
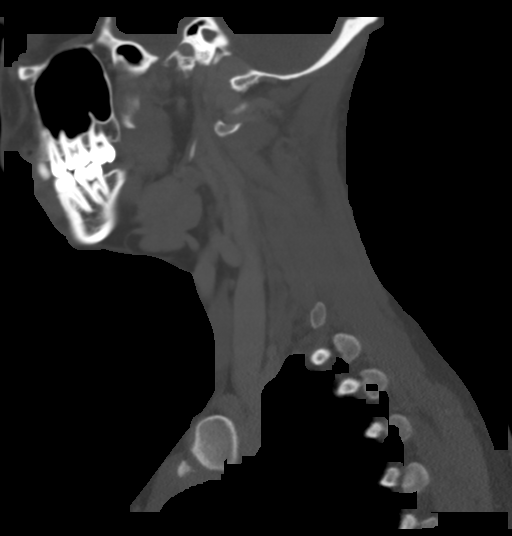
[im 42/101  bone]
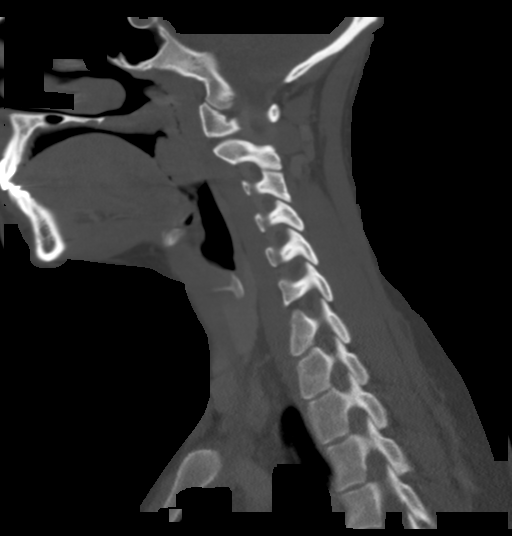
[im 51/101  soft-tissue]
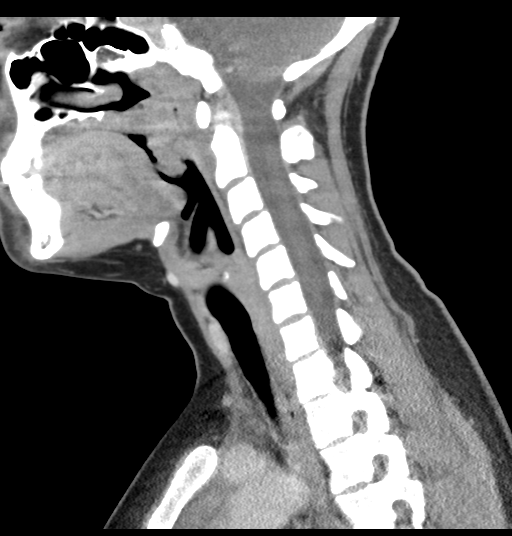
[im 51/101  bone]
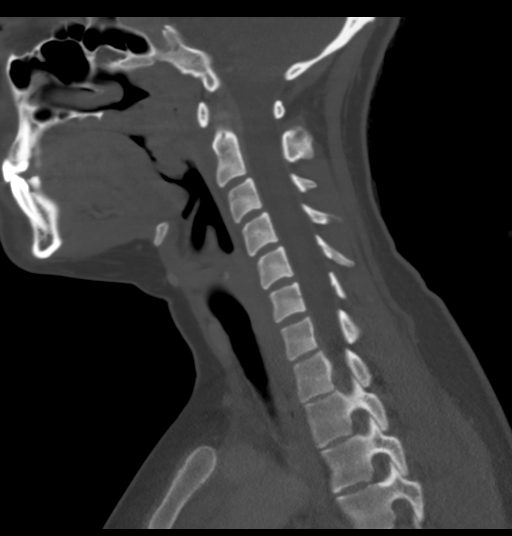
[im 59/101  bone]
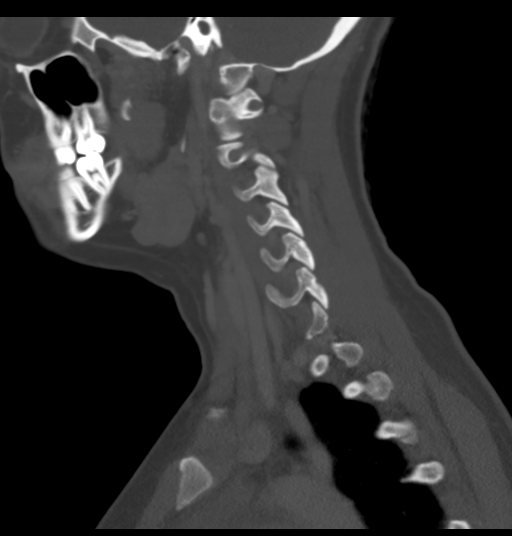
[im 67/101  bone]
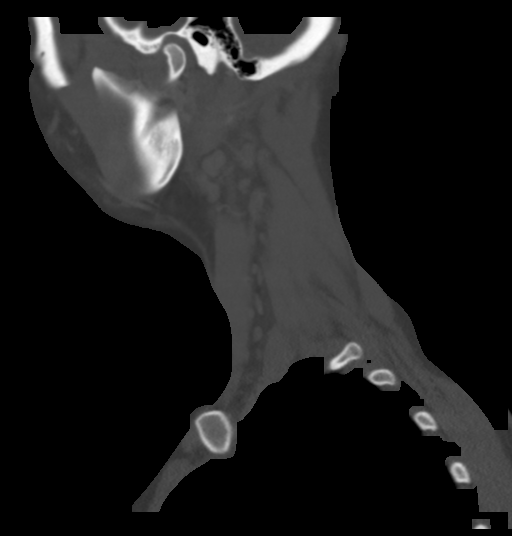

[Series 5: cor neck · coronal · 0.36mm/px · 3 of 101 slices shown]
[im 21/101  bone]
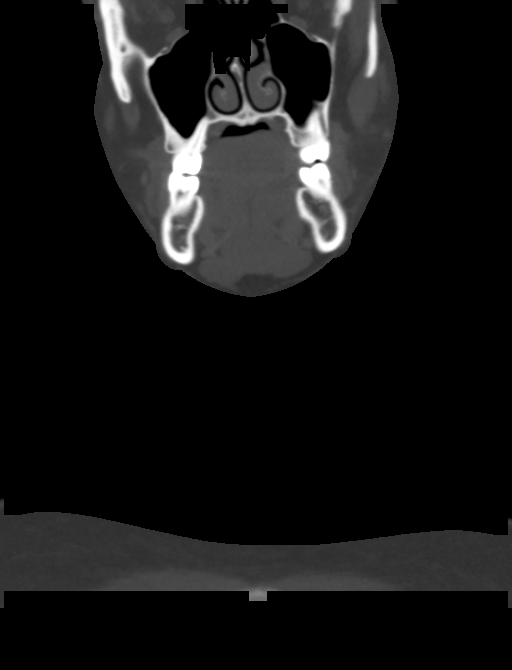
[im 41/101  bone]
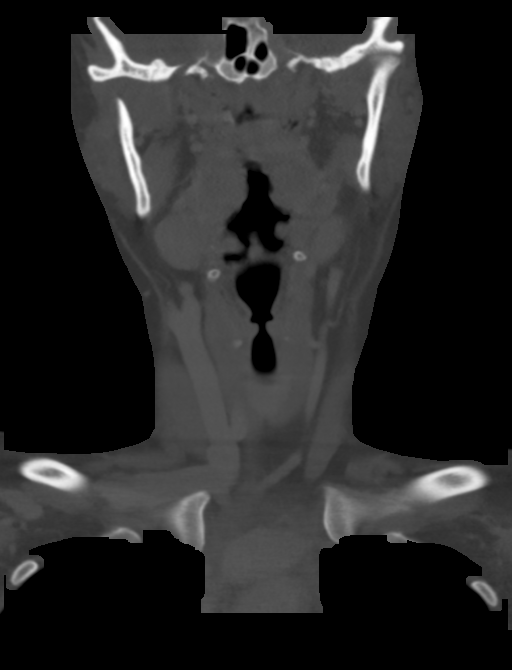
[im 61/101  bone]
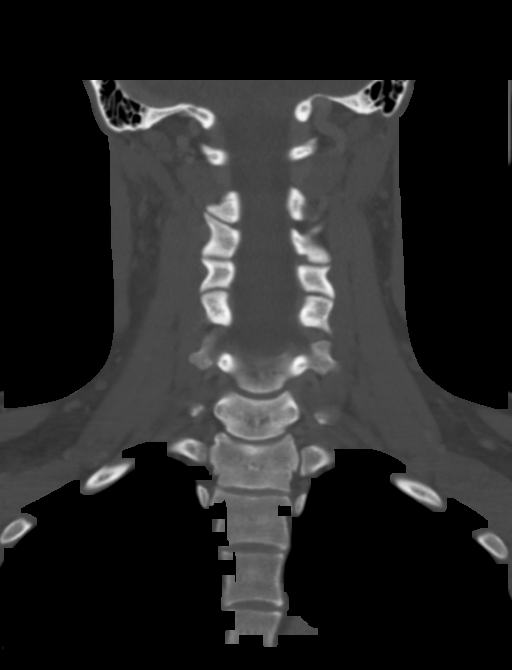

[Series 6: ax oropharynx · axial · 0.39mm/px · z∈[-219,-61]mm · 5 of 123 slices shown, 7 images]
[im 21/123  soft-tissue]
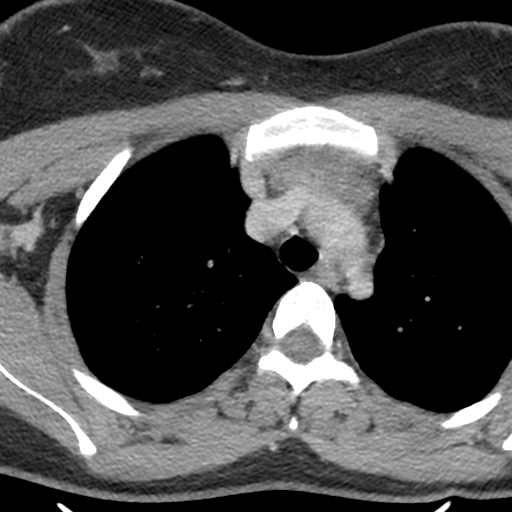
[im 21/123  bone]
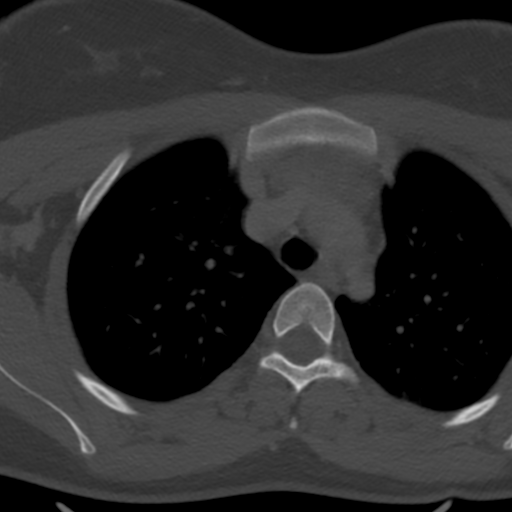
[im 41/123  bone]
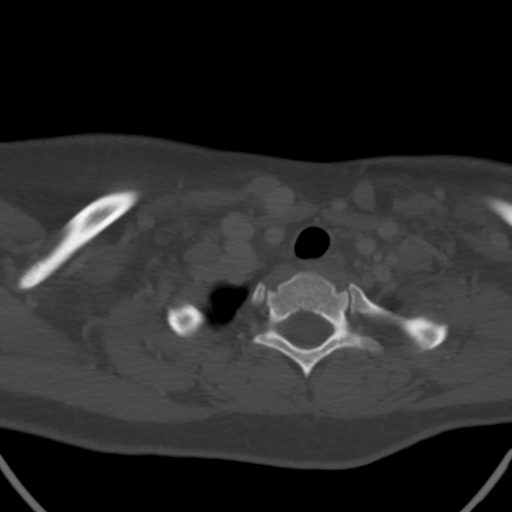
[im 62/123  bone]
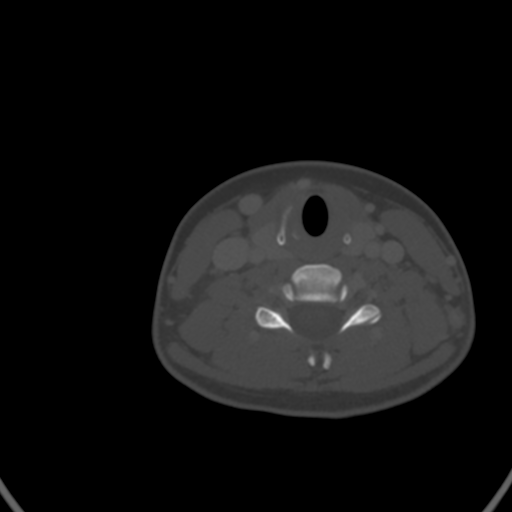
[im 82/123  bone]
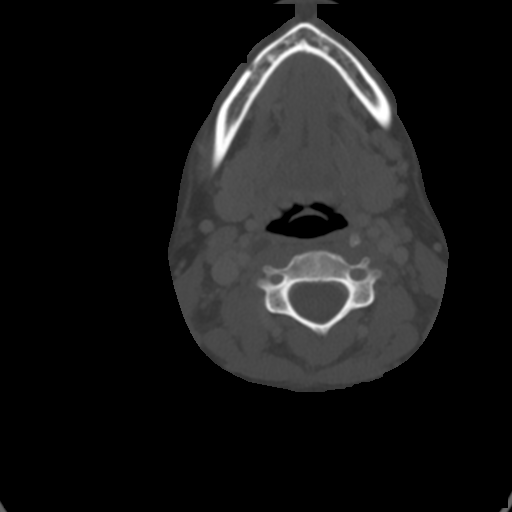
[im 102/123  soft-tissue]
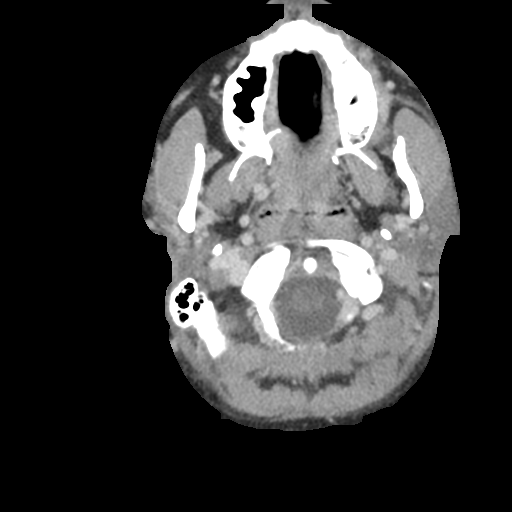
[im 102/123  bone]
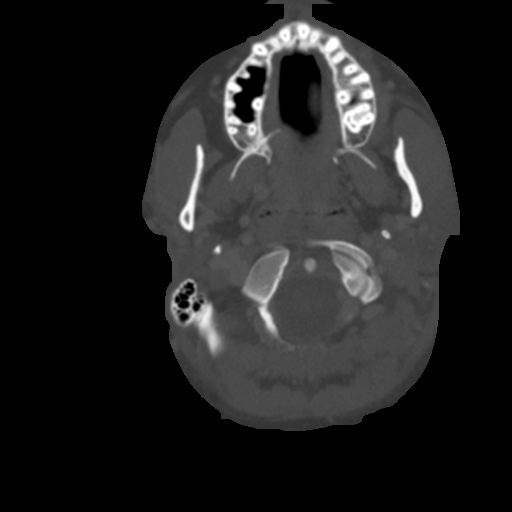

[15 of 33 positions shown; findings below may reference images not displayed]

FINDINGS: CT NECK FINDINGS

Pharynx and larynx: Normal. No mass or swelling.

Salivary glands: No inflammation, mass, or stone.

Thyroid: Normal.

Lymph nodes: None enlarged or abnormal density.

Vascular: Negative.

Limited intracranial: Negative.

Visualized orbits: Negative.

Mastoids and visualized paranasal sinuses: Clear.

Skeleton: Normal appearing bones.

Upper chest: Unremarkable.

Other: None.

CT CHEST FINDINGS

Cardiovascular: No significant vascular findings. Normal heart size.
No pericardial effusion.

Mediastinum/Nodes: No enlarged mediastinal or axillary lymph nodes.
Thyroid gland, trachea, and esophagus demonstrate no significant
findings. Normal appearing thymus gland.

Lungs/Pleura: Lungs are clear. No pleural effusion or pneumothorax.

Upper Abdomen: No acute abnormality.

Musculoskeletal: Mild scoliosis.
IMPRESSION: Normal CT of the neck and chest. No extrinsic esophageal compression
visualized.

## 2020-05-12 IMAGING — CT CT CHEST W/ CM
2 of 3 series · 15 of 36 positions shown, 18 images · IV contrast (Omnipaque or Isovue)
Comparison: None.

CLINICAL DATA: Extrinsic compression of the upper esophagus at
recent endoscopy. Chronic nausea and vomiting.

EXAM:
CT NECK AND CHEST WITH CONTRAST
CONTRAST:  125 cc Omnipaque 300

[Series 2: routine chest with · axial · 0.56mm/px · z∈[-410,-158]mm · 12 of 150 slices shown, 15 images]
[im 12/150  mediastinal]
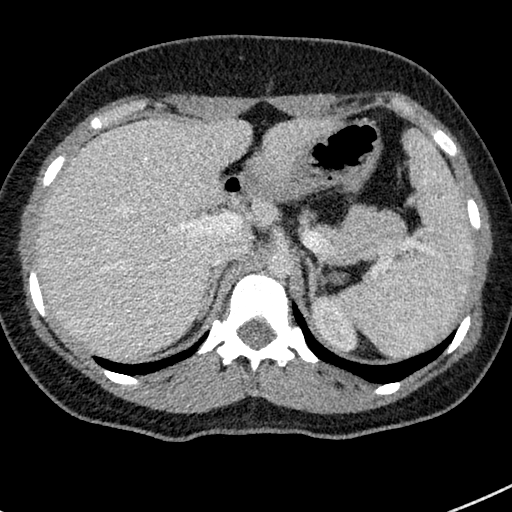
[im 12/150  lung]
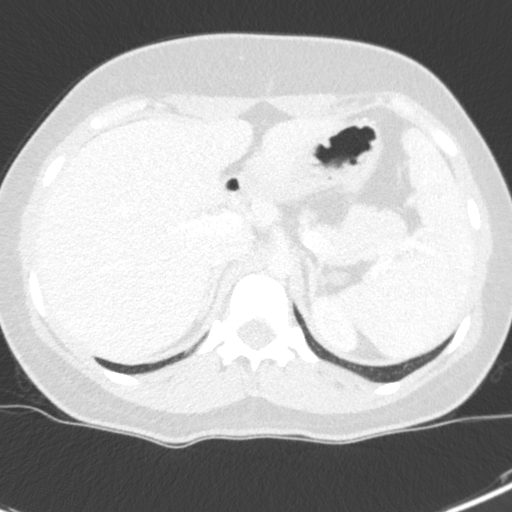
[im 23/150  lung]
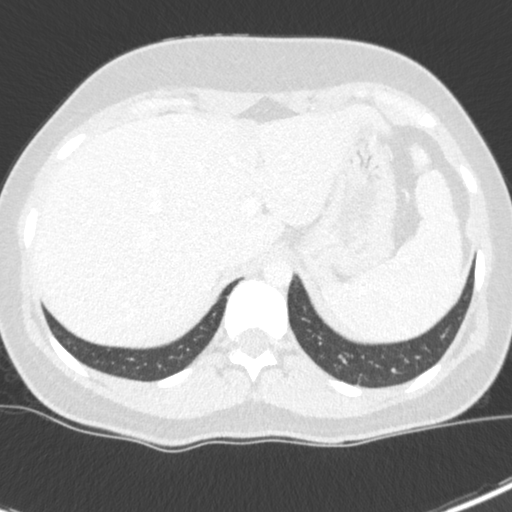
[im 34/150  lung]
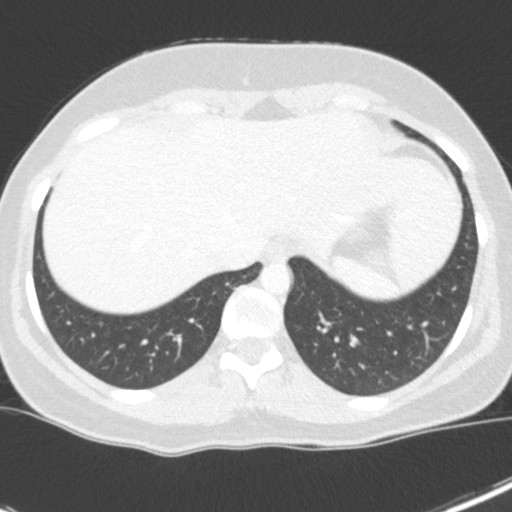
[im 45/150  lung]
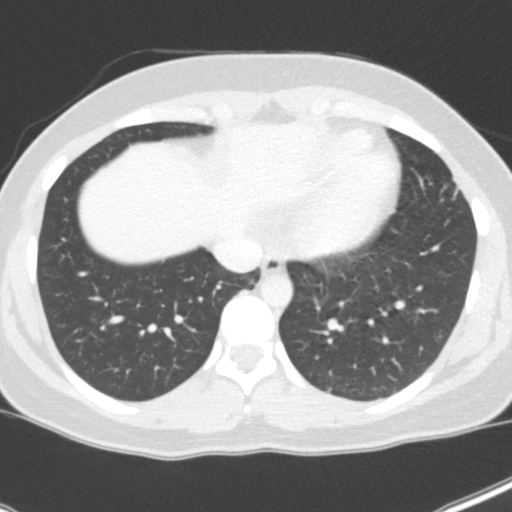
[im 56/150  mediastinal]
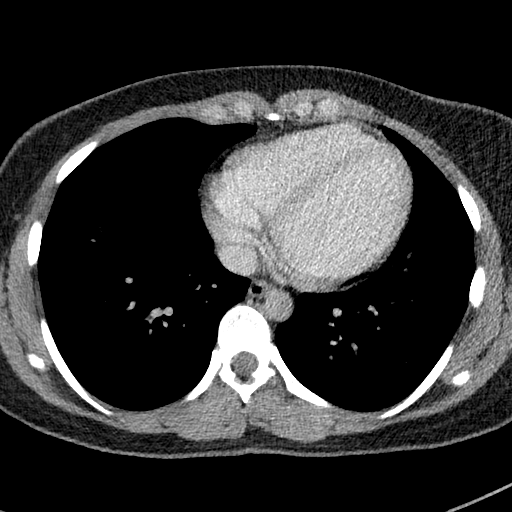
[im 56/150  lung]
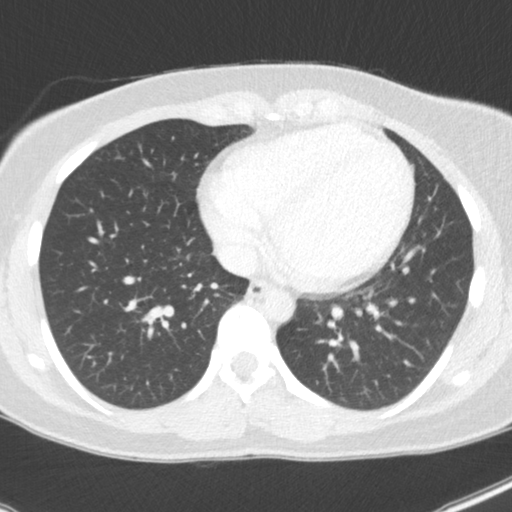
[im 67/150  lung]
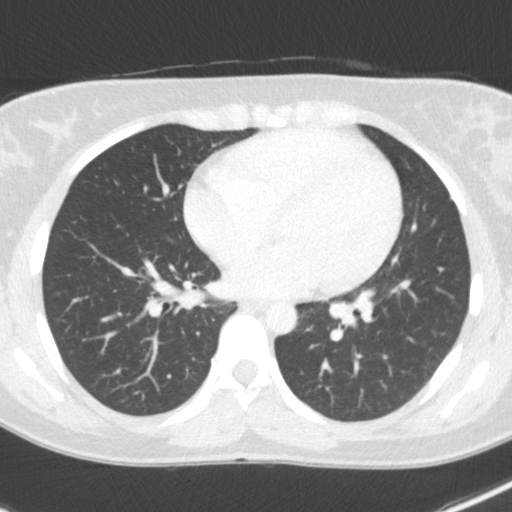
[im 83/150  lung]
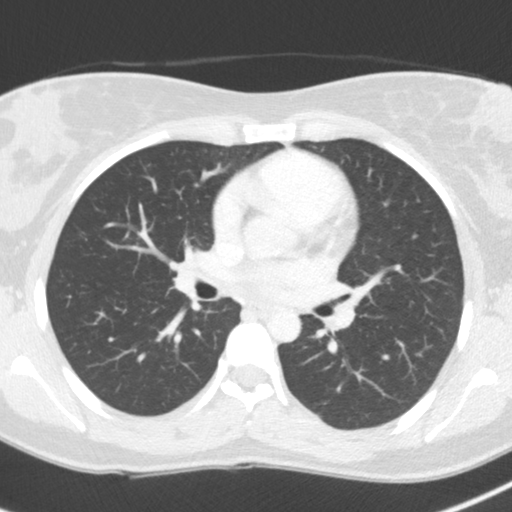
[im 94/150  lung]
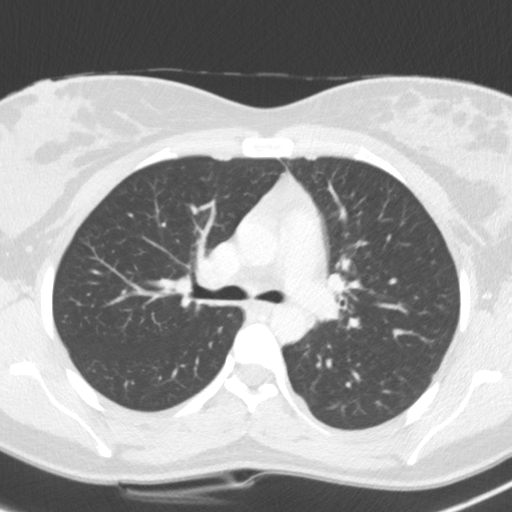
[im 105/150  mediastinal]
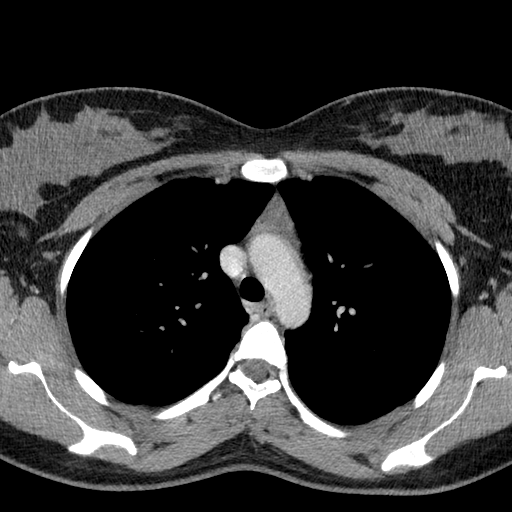
[im 105/150  lung]
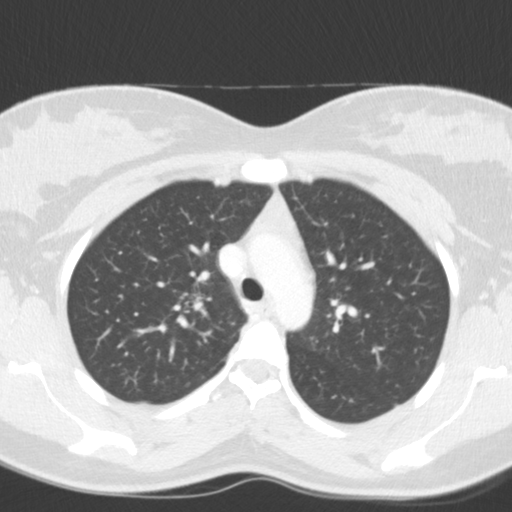
[im 116/150  lung]
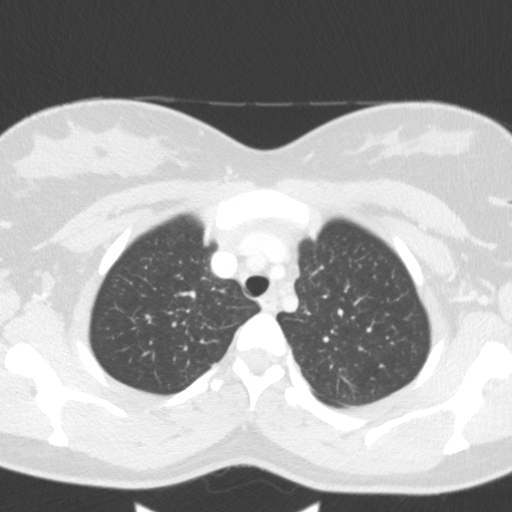
[im 127/150  lung]
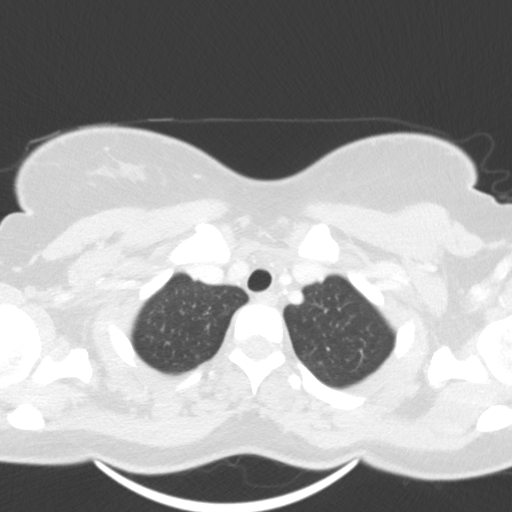
[im 138/150  lung]
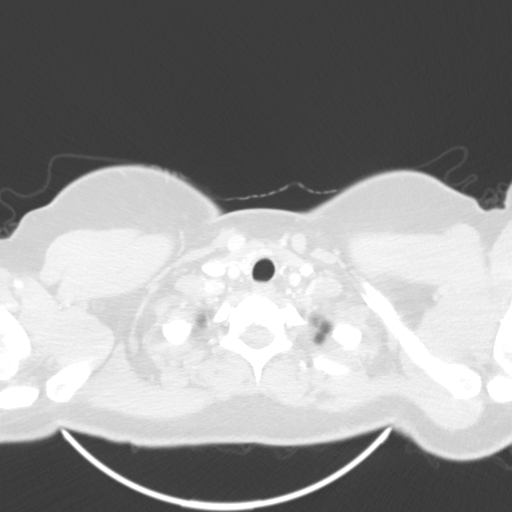

[Series 5: coronal · coronal · 0.62mm/px · 3 of 134 slices shown]
[im 27/134  lung]
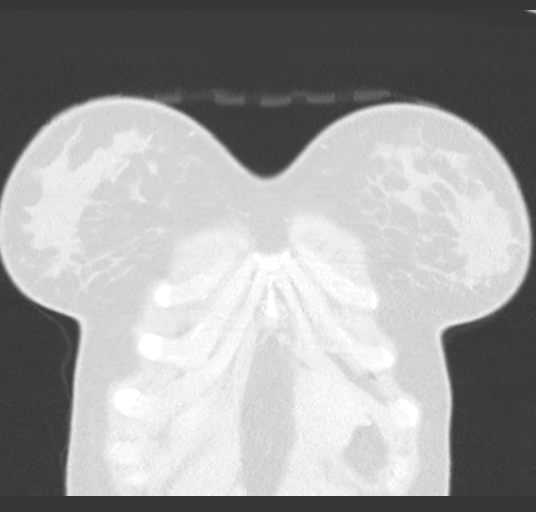
[im 54/134  lung]
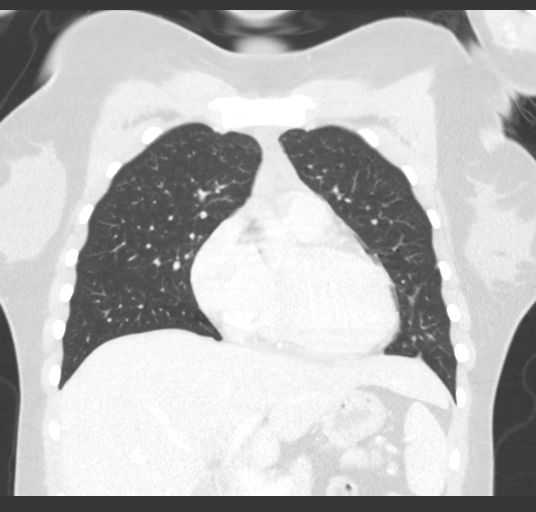
[im 80/134  lung]
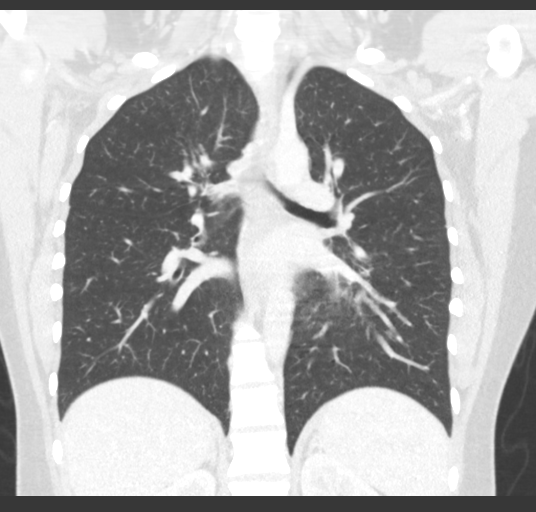

[15 of 36 positions shown; findings below may reference images not displayed]

FINDINGS: CT NECK FINDINGS

Pharynx and larynx: Normal. No mass or swelling.

Salivary glands: No inflammation, mass, or stone.

Thyroid: Normal.

Lymph nodes: None enlarged or abnormal density.

Vascular: Negative.

Limited intracranial: Negative.

Visualized orbits: Negative.

Mastoids and visualized paranasal sinuses: Clear.

Skeleton: Normal appearing bones.

Upper chest: Unremarkable.

Other: None.

CT CHEST FINDINGS

Cardiovascular: No significant vascular findings. Normal heart size.
No pericardial effusion.

Mediastinum/Nodes: No enlarged mediastinal or axillary lymph nodes.
Thyroid gland, trachea, and esophagus demonstrate no significant
findings. Normal appearing thymus gland.

Lungs/Pleura: Lungs are clear. No pleural effusion or pneumothorax.

Upper Abdomen: No acute abnormality.

Musculoskeletal: Mild scoliosis.
IMPRESSION: Normal CT of the neck and chest. No extrinsic esophageal compression
visualized.

## 2020-05-12 MED ORDER — IOHEXOL 300 MG/ML  SOLN
125.0000 mL | Freq: Once | INTRAMUSCULAR | Status: AC | PRN
  Administered 2020-05-12: 125 mL via INTRAVENOUS

## 2020-05-14 ENCOUNTER — Encounter: Payer: Self-pay | Admitting: Gastroenterology

## 2020-05-14 ENCOUNTER — Telehealth (INDEPENDENT_AMBULATORY_CARE_PROVIDER_SITE_OTHER): Payer: Commercial Managed Care - PPO | Admitting: Gastroenterology

## 2020-05-14 DIAGNOSIS — K2289 Other specified disease of esophagus: Secondary | ICD-10-CM

## 2020-05-14 DIAGNOSIS — R109 Unspecified abdominal pain: Secondary | ICD-10-CM | POA: Diagnosis not present

## 2020-05-14 DIAGNOSIS — K228 Other specified diseases of esophagus: Secondary | ICD-10-CM

## 2020-05-15 NOTE — Progress Notes (Signed)
Annette Antigua, MD [address]  [place]  [place], Annette Gould [address]  Main: [phone]  Fax: [phone]   Primary Care Physician: Cari Caraway, MD  Virtual Visit via Telephone Note  I connected with patient on 05/15/20 at  1:30 PM EDT by telephone and verified that I am speaking with the correct person using two identifiers.   I discussed the limitations, risks, security and privacy concerns of performing an evaluation and management service by telephone and the availability of in person appointments. I also discussed with the patient that there may be a patient responsible charge related to this service. The patient expressed understanding and agreed to proceed.  Location of Patient: Home Location of Provider: Home Persons involved: Patient and provider only during the visit (nursing staff and front desk staff was involved in communicating with the patient prior to the appointment, reviewing medications and checking them in)   History of Present Illness: Chief Complaint  Patient presents with  . Abdominal Pain     HPI: Annette Gould is a 19 y.o. female here for follow-up of abdominal pain.  Patient states her symptoms have resolved since the last EGD.  She is eating well.  Her biopsies did not show celiac disease.  She states she stopped eating gluten 2 days before the procedure as she states she notices symptoms when eating gluten including nausea and vomiting.  Her EGD showed an extrinsic compression in the esophagus and a CT chest and neck were ordered that were normal.  Patient is denying dysphagia.  Current Outpatient Medications  Medication Sig Dispense Refill  . AUROVELA FE 1.5/30 1.5-30 MG-MCG tablet Take 1 tablet by mouth daily.    . ondansetron (ZOFRAN ODT) 4 MG disintegrating tablet Take 1 tablet (4 mg total) by mouth every 8 (eight) hours as needed for nausea or vomiting. 20 tablet 0  . pantoprazole (PROTONIX) 40 MG tablet Take 1 tablet (40 mg  total) by mouth 2 (two) times daily before a meal. 60 tablet 3  . propranolol (INDERAL) 10 MG tablet Take 10 mg by mouth daily.     . sertraline (ZOLOFT) 50 MG tablet Take 75 mg by mouth daily.     No current facility-administered medications for this visit.    Allergies as of 05/14/2020 - Review Complete 05/14/2020  Allergen Reaction Noted  . Peanut-containing drug products Hives and Swelling 04/10/2020    Review of Systems:    All systems reviewed and negative except where noted in HPI.   Observations/Objective:  Labs: CMP     Component Value Date/Time   NA 137 12/28/2019 0806   K 3.7 12/28/2019 0806   CL 104 12/28/2019 0806   CO2 23 12/28/2019 0806   GLUCOSE 109 (H) 12/28/2019 0806   BUN 11 12/28/2019 0806   CREATININE 0.73 12/28/2019 0806   CALCIUM 9.2 12/28/2019 0806   PROT 8.0 12/28/2019 0806   ALBUMIN 4.4 12/28/2019 0806   AST 18 12/28/2019 0806   ALT 16 12/28/2019 0806   ALKPHOS 60 12/28/2019 0806   BILITOT 0.4 12/28/2019 0806   GFRNONAA >60 12/28/2019 0806   GFRAA >60 12/28/2019 0806   Lab Results  Component Value Date   WBC 11.2 (H) 12/28/2019   HGB 14.3 12/28/2019   HCT 42.4 12/28/2019   MCV 80.0 12/28/2019   PLT 330 12/28/2019    Imaging Studies: CT SOFT TISSUE NECK W CONTRAST  Result Date: 05/12/2020 CLINICAL DATA:  Extrinsic compression of the upper esophagus at  recent endoscopy. Chronic nausea and vomiting. EXAM: CT NECK AND CHEST WITH CONTRAST CONTRAST:  125 cc Omnipaque 300 COMPARISON:  None. FINDINGS: CT NECK FINDINGS Pharynx and larynx: Normal. No mass or swelling. Salivary glands: No inflammation, mass, or stone. Thyroid: Normal. Lymph nodes: None enlarged or abnormal density. Vascular: Negative. Limited intracranial: Negative. Visualized orbits: Negative. Mastoids and visualized paranasal sinuses: Clear. Skeleton: Normal appearing bones. Upper chest: Unremarkable. Other: None. CT CHEST FINDINGS Cardiovascular: No significant vascular findings.  Normal heart size. No pericardial effusion. Mediastinum/Nodes: No enlarged mediastinal or axillary lymph nodes. Thyroid gland, trachea, and esophagus demonstrate no significant findings. Normal appearing thymus gland. Lungs/Pleura: Lungs are clear. No pleural effusion or pneumothorax. Upper Abdomen: No acute abnormality. Musculoskeletal: Mild scoliosis. IMPRESSION: Normal CT of the neck and chest. No extrinsic esophageal compression visualized. Electronically Signed   By: Claudie Revering M.D.   On: 05/12/2020 20:59   CT CHEST W CONTRAST  Result Date: 05/12/2020 CLINICAL DATA:  Extrinsic compression of the upper esophagus at recent endoscopy. Chronic nausea and vomiting. EXAM: CT NECK AND CHEST WITH CONTRAST CONTRAST:  125 cc Omnipaque 300 COMPARISON:  None. FINDINGS: CT NECK FINDINGS Pharynx and larynx: Normal. No mass or swelling. Salivary glands: No inflammation, mass, or stone. Thyroid: Normal. Lymph nodes: None enlarged or abnormal density. Vascular: Negative. Limited intracranial: Negative. Visualized orbits: Negative. Mastoids and visualized paranasal sinuses: Clear. Skeleton: Normal appearing bones. Upper chest: Unremarkable. Other: None. CT CHEST FINDINGS Cardiovascular: No significant vascular findings. Normal heart size. No pericardial effusion. Mediastinum/Nodes: No enlarged mediastinal or axillary lymph nodes. Thyroid gland, trachea, and esophagus demonstrate no significant findings. Normal appearing thymus gland. Lungs/Pleura: Lungs are clear. No pleural effusion or pneumothorax. Upper Abdomen: No acute abnormality. Musculoskeletal: Mild scoliosis. IMPRESSION: Normal CT of the neck and chest. No extrinsic esophageal compression visualized. Electronically Signed   By: Claudie Revering M.D.   On: 05/12/2020 20:59    Assessment and Plan:   Annette Gould is a 19 y.o. y/o female here for follow-up of abdominal pain, nausea and vomiting  Assessment and Plan: Her above symptoms have completely  resolved  However, I have reassured her that she is unlikely to be gluten intolerant given that her duodenal biopsies were normal and she had only held gluten for 2 days prior to that procedure.  In addition, gluten intolerance would not be expected to just cause nausea vomiting and would be expected to cause more bloating or diarrhea.  We offered to do HLA DQ testing but since patient is asymptomatic she is not interested at this time and if symptoms recur she will contact us for the same  We discussed that extrinsic compression noted in her esophagus and further work-up for that would include EUS.  Patient is interested in the same, will refer to Dr. Rush Landmark  Follow Up Instructions:    I discussed the assessment and treatment plan with the patient. The patient was provided an opportunity to ask questions and all were answered. The patient agreed with the plan and demonstrated an understanding of the instructions.   The patient was advised to call back or seek an in-person evaluation if the symptoms worsen or if the condition fails to improve as anticipated.  I provided 15 minutes of non-face-to-face time during this encounter. Additional time was spent in reviewing patient's chart, placing orders etc.   Virgel Manifold, MD  Speech recognition software was used to dictate this note.

## 2020-05-19 ENCOUNTER — Telehealth: Payer: Self-pay

## 2020-05-19 ENCOUNTER — Other Ambulatory Visit: Payer: Self-pay

## 2020-05-19 DIAGNOSIS — K2289 Other specified disease of esophagus: Secondary | ICD-10-CM

## 2020-05-19 DIAGNOSIS — R109 Unspecified abdominal pain: Secondary | ICD-10-CM

## 2020-05-19 DIAGNOSIS — K228 Other specified diseases of esophagus: Secondary | ICD-10-CM

## 2020-05-19 NOTE — Telephone Encounter (Signed)
The pt has been scheduled for EUS on 07/10/20 at 830 am with Dr Christella Hartigan.  COVID test on 10/4.  All information mailed to the pt and also given over the phone.  She has been advised to call with any questions.

## 2020-05-19 NOTE — Telephone Encounter (Signed)
Mansouraty, Netty Starring., MD  Loretha Stapler, RN; Rachael Fee, MD Erasmo Vertz, This patient needs a radial/linear EUS with DJ or myself in the coming weeks. Patient has had cross-sectional imaging of the neck that has not shown any evidence of a mass or lesion. We need to rule out a subepithelial lesion such as a leiomyoma or neuro ganglioma or a duplication cyst. Thanks. GM

## 2020-05-21 ENCOUNTER — Other Ambulatory Visit: Payer: Self-pay

## 2020-05-21 ENCOUNTER — Emergency Department (HOSPITAL_COMMUNITY): Payer: Commercial Managed Care - PPO

## 2020-05-21 ENCOUNTER — Encounter (HOSPITAL_COMMUNITY): Payer: Self-pay

## 2020-05-21 ENCOUNTER — Emergency Department (HOSPITAL_COMMUNITY)
Admission: EM | Admit: 2020-05-21 | Discharge: 2020-05-21 | Disposition: A | Discharge status: Home / Self Care | Payer: Commercial Managed Care - PPO | Attending: Emergency Medicine | Admitting: Emergency Medicine

## 2020-05-21 DIAGNOSIS — R002 Palpitations: Secondary | ICD-10-CM | POA: Diagnosis not present

## 2020-05-21 DIAGNOSIS — K219 Gastro-esophageal reflux disease without esophagitis: Secondary | ICD-10-CM | POA: Insufficient documentation

## 2020-05-21 DIAGNOSIS — R0789 Other chest pain: Secondary | ICD-10-CM | POA: Insufficient documentation

## 2020-05-21 DIAGNOSIS — Z79899 Other long term (current) drug therapy: Secondary | ICD-10-CM | POA: Insufficient documentation

## 2020-05-21 DIAGNOSIS — R112 Nausea with vomiting, unspecified: Secondary | ICD-10-CM | POA: Diagnosis present

## 2020-05-21 LAB — COMPREHENSIVE METABOLIC PANEL
ALT: 14 U/L (ref 0–44)
AST: 14 U/L — ABNORMAL LOW (ref 15–41)
Albumin: 4 g/dL (ref 3.5–5.0)
Alkaline Phosphatase: 56 U/L (ref 38–126)
Anion gap: 8 (ref 5–15)
BUN: 7 mg/dL (ref 6–20)
CO2: 21 mmol/L — ABNORMAL LOW (ref 22–32)
Calcium: 9.1 mg/dL (ref 8.9–10.3)
Chloride: 107 mmol/L (ref 98–111)
Creatinine, Ser: 0.64 mg/dL (ref 0.44–1.00)
GFR calc Af Amer: >60 mL/min (ref >60)
GFR calc non Af Amer: >60 mL/min (ref >60)
Glucose, Bld: 97 mg/dL (ref 70–99)
Potassium: 3.3 mmol/L — ABNORMAL LOW (ref 3.5–5.1)
Sodium: 136 mmol/L (ref 135–145)
Total Bilirubin: 0.6 mg/dL (ref 0.3–1.2)
Total Protein: 7.4 g/dL (ref 6.5–8.1)

## 2020-05-21 LAB — CBC WITH DIFFERENTIAL/PLATELET
Abs Immature Granulocytes: 0.02 10*3/uL (ref 0.00–0.07)
Basophils Absolute: 0 10*3/uL (ref 0.0–0.1)
Basophils Relative: 0 %
Eosinophils Absolute: 0.1 10*3/uL (ref 0.0–0.5)
Eosinophils Relative: 1 %
HCT: 40.7 % (ref 36.0–46.0)
Hemoglobin: 13.8 g/dL (ref 12.0–15.0)
Immature Granulocytes: 0 %
Lymphocytes Relative: 18 %
Lymphs Abs: 1.6 10*3/uL (ref 0.7–4.0)
MCH: 26.8 pg (ref 26.0–34.0)
MCHC: 33.9 g/dL (ref 30.0–36.0)
MCV: 79 fL — ABNORMAL LOW (ref 80.0–100.0)
Monocytes Absolute: 0.5 10*3/uL (ref 0.1–1.0)
Monocytes Relative: 6 %
Neutro Abs: 6.8 10*3/uL (ref 1.7–7.7)
Neutrophils Relative %: 75 %
Platelets: 298 10*3/uL (ref 150–400)
RBC: 5.15 MIL/uL — ABNORMAL HIGH (ref 3.87–5.11)
RDW: 12 % (ref 11.5–15.5)
WBC: 9 10*3/uL (ref 4.0–10.5)
nRBC: 0 % (ref 0.0–0.2)

## 2020-05-21 LAB — D-DIMER, QUANTITATIVE: D-Dimer, Quant: <0.27 ug/mL-FEU (ref 0.00–0.50)

## 2020-05-21 LAB — LIPASE, BLOOD: Lipase: 24 U/L (ref 11–51)

## 2020-05-21 LAB — TROPONIN I (HIGH SENSITIVITY)
Troponin I (High Sensitivity): 2 ng/L (ref <18)
Troponin I (High Sensitivity): 2 ng/L (ref <18)

## 2020-05-21 IMAGING — DX DG CHEST 1V PORT
1 series · 1 of 1 positions shown · non-contrast
Comparison: Portable exam [C1] hours compared to CT chest of
[DATE]

CLINICAL DATA: Chest pain, history GERD, recent upper endoscopy

EXAM:
PORTABLE CHEST 1 VIEW

[chest ap]
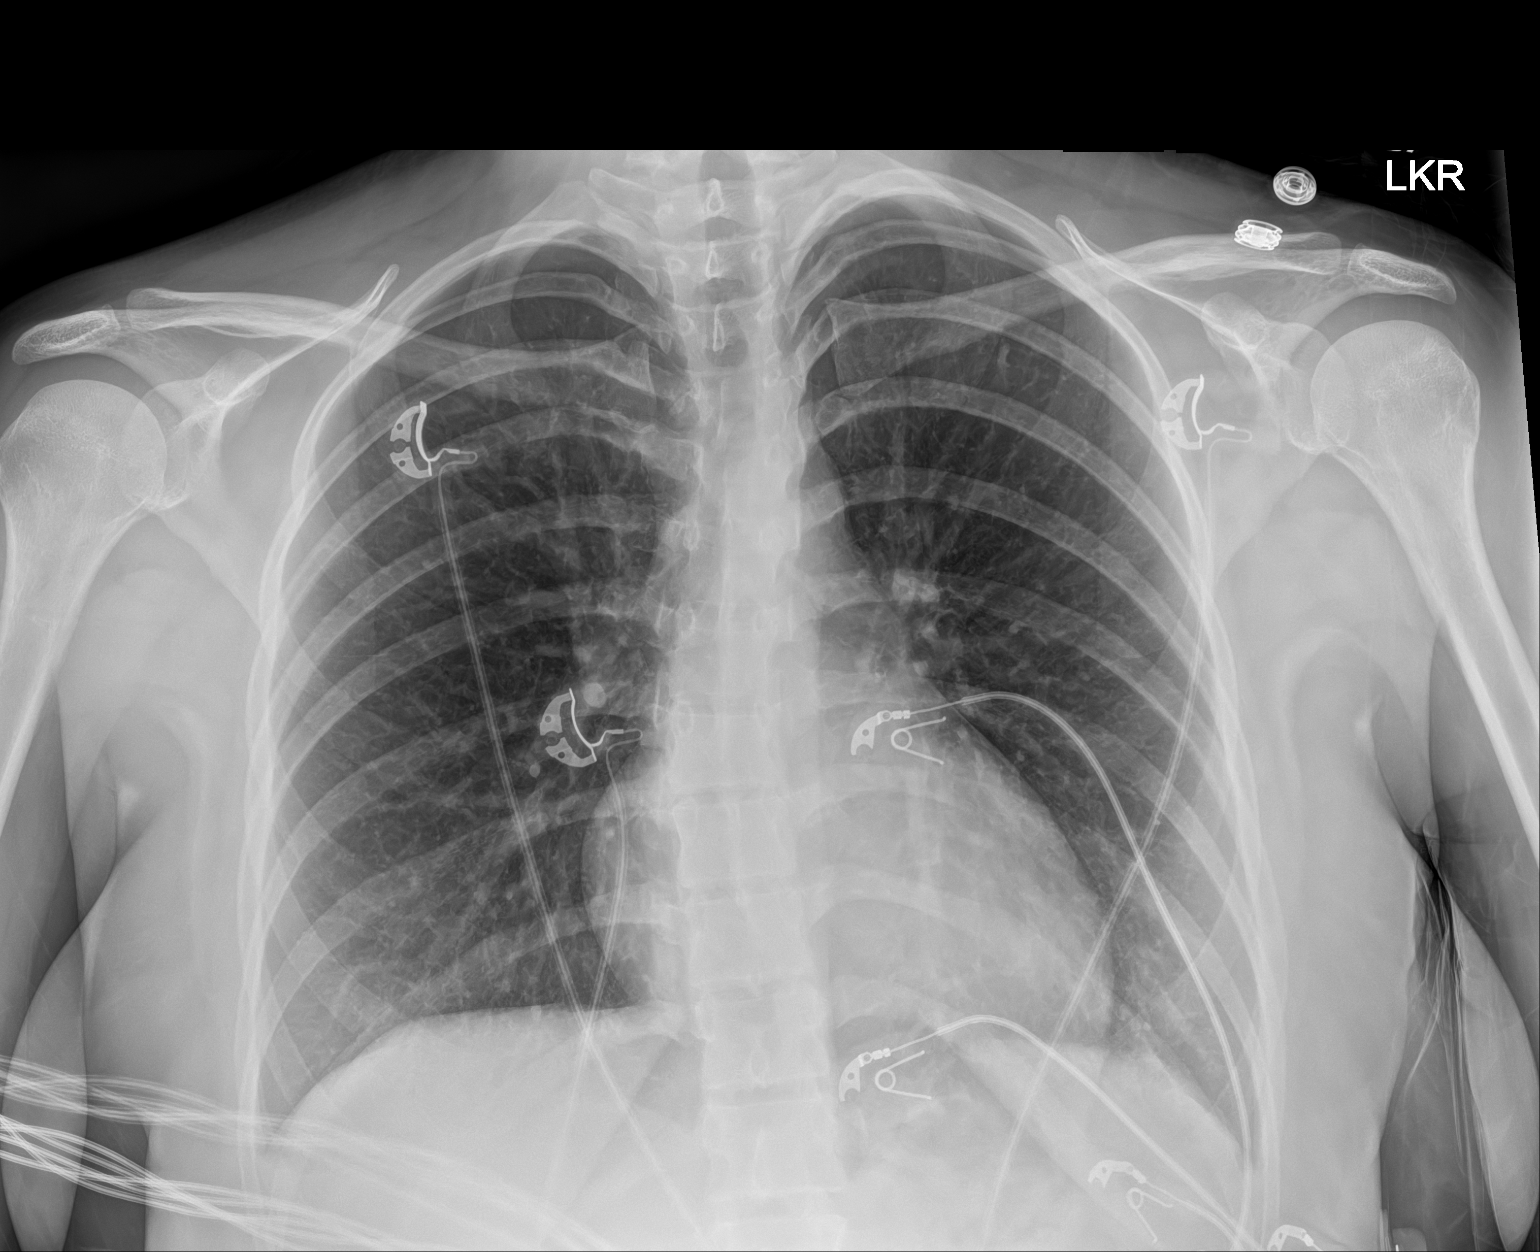

[1 of 1 positions shown; findings below may reference images not displayed]

FINDINGS: Normal heart size, mediastinal contours, and pulmonary vascularity.

Lungs clear.

No pulmonary infiltrate, pleural effusion or pneumothorax.

Mild biconvex thoracic scoliosis.
IMPRESSION: No acute abnormalities.

## 2020-05-21 MED ORDER — ONDANSETRON 4 MG PO TBDP: 4.0000 mg | ORAL_TABLET | Freq: Three times a day (TID) | ORAL | 1 refills | Status: DC | PRN

## 2020-05-21 MED ORDER — SODIUM CHLORIDE 0.9 % IV SOLN: INTRAVENOUS | Status: DC

## 2020-05-21 MED ORDER — SODIUM CHLORIDE 0.9 % IV BOLUS
1000.0000 mL | Freq: Once | INTRAVENOUS | Status: AC
  Administered 2020-05-21: 1000 mL via INTRAVENOUS

## 2020-05-21 MED ORDER — ONDANSETRON HCL 4 MG/2ML IJ SOLN
4.0000 mg | Freq: Once | INTRAMUSCULAR | Status: AC
  Administered 2020-05-21: 4 mg via INTRAVENOUS
  Filled 2020-05-21: qty 2

## 2020-05-21 MED ORDER — PANTOPRAZOLE SODIUM 40 MG IV SOLR
40.0000 mg | Freq: Once | INTRAVENOUS | Status: AC
  Administered 2020-05-21: 40 mg via INTRAVENOUS
  Filled 2020-05-21: qty 40

## 2020-05-21 NOTE — ED Provider Notes (Signed)
[facility] EMERGENCY DEPARTMENT Provider Note   CSN: [id number] Arrival date & time: 05/21/20  0747     History Chief Complaint  Patient presents with  . Palpitations  . Emesis    Annette Gould is a 19 y.o. female.  Patient followed by [facility] gastroenterology.  Patient's had an upper endoscopy with biopsies which were negative.  They are evaluating her for gastroesophageal reflux disease.  I did notice a little bit of compression on the esophagus and ordered CT abdomen and CT chest without acute findings.  They are planning to do an ultrasound of the esophagus.  Patient presents today with chest discomfort right breast right shoulder blade area no abdominal pain.  Patient with vomiting yesterday.  And also reporting heart palpitations.        Past Medical History:  Diagnosis Date  . Bursitis    hips  . GERD (gastroesophageal reflux disease)   . Migraine headache    approx 2/month    Patient Active Problem List   Diagnosis Date Noted  . Abdominal pain 01/01/2020  . Nausea with vomiting 01/01/2020  . GERD (gastroesophageal reflux disease) 01/01/2020  . Constipation 01/01/2020    Past Surgical History:  Procedure Laterality Date  . BIOPSY N/A 04/15/2020   Procedure: BIOPSY;  Surgeon: Pasty Spillers, MD;  Location: [facility];  Service: Endoscopy;  Laterality: N/A;  . DENTAL SURGERY    . ESOPHAGOGASTRODUODENOSCOPY (EGD) WITH PROPOFOL N/A 04/15/2020   Procedure: ESOPHAGOGASTRODUODENOSCOPY (EGD) WITH PROPOFOL;  Surgeon: Pasty Spillers, MD;  Location: [facility];  Service: Endoscopy;  Laterality: N/A;  . TYMPANOSTOMY TUBE PLACEMENT       OB History   No obstetric history on file.     Family History  Problem Relation Age of Onset  . Gastric cancer Maternal Grandfather        "not sure where, but somewhere in the stomach area"  . Colon cancer Neg Hx     Social History   Tobacco Use  . Smoking status: Never Smoker  . Smokeless  tobacco: Never Used  . Tobacco comment: Smoked less than 1 pack per week for about 3 months  Vaping Use  . Vaping Use: Never used  Substance Use Topics  . Alcohol use: Not Currently    Comment: may have 1-2 drinks/month  . Drug use: Not Currently    Types: Marijuana    Comment: denies 01/01/20    Home Medications Prior to Admission medications   Medication Sig Start Date End Date Taking? Authorizing Provider  AUROVELA FE 1.5/30 1.5-30 MG-MCG tablet Take 1 tablet by mouth daily. 11/12/19   [provider]  ondansetron (ZOFRAN ODT) 4 MG disintegrating tablet Take 1 tablet (4 mg total) by mouth every 8 (eight) hours as needed for nausea or vomiting. 04/10/20   Pasty Spillers, MD  ondansetron (ZOFRAN ODT) 4 MG disintegrating tablet Take 1 tablet (4 mg total) by mouth every 8 (eight) hours as needed. 05/21/20   Vanetta Mulders, MD  pantoprazole (PROTONIX) 40 MG tablet Take 1 tablet (40 mg total) by mouth 2 (two) times daily before a meal. 01/01/20   Anice Paganini, NP  propranolol (INDERAL) 10 MG tablet Take 10 mg by mouth daily.  11/12/19   [provider]  sertraline (ZOLOFT) 50 MG tablet Take 75 mg by mouth daily. 11/12/19   [provider]  famotidine (PEPCID) 20 MG tablet Take 1 tablet (20 mg total) by mouth 2 (two) times daily.  12/28/19 01/01/20  [name], PA-C    Allergies    Peanut-containing drug products  Review of Systems   Review of Systems  Constitutional: Negative for chills and fever.  HENT: Negative for congestion, rhinorrhea and sore throat.   Eyes: Negative for visual disturbance.  Respiratory: Negative for cough and shortness of breath.   Cardiovascular: Positive for chest pain and palpitations. Negative for leg swelling.  Gastrointestinal: Positive for nausea and vomiting. Negative for abdominal pain and diarrhea.  Genitourinary: Negative for dysuria.  Musculoskeletal: Negative for back pain and neck pain.  Skin: Negative for rash.    Neurological: Negative for dizziness, light-headedness and headaches.  Hematological: Does not bruise/bleed easily.  Psychiatric/Behavioral: Negative for confusion.    Physical Exam Updated Vital Signs BP (!) 145/92   Pulse (!) 110   Temp 98.5 F (36.9 C) (Oral)   Resp 18   Ht 1.575 m (5\' 2" )   Wt 63.5 kg   LMP 05/12/2020   SpO2 97%   BMI 25.61 kg/m   Physical Exam Vitals and nursing note reviewed.  Constitutional:      General: She is not in acute distress.    Appearance: Normal appearance. She is well-developed.  HENT:     Head: Normocephalic and atraumatic.  Eyes:     Extraocular Movements: Extraocular movements intact.     Conjunctiva/sclera: Conjunctivae normal.     Pupils: Pupils are equal, round, and reactive to light.  Cardiovascular:     Rate and Rhythm: Normal rate and regular rhythm.     Heart sounds: No murmur heard.   Pulmonary:     Effort: Pulmonary effort is normal. No respiratory distress.     Breath sounds: Normal breath sounds.  Abdominal:     Palpations: Abdomen is soft.     Tenderness: There is no abdominal tenderness.  Musculoskeletal:        General: No swelling. Normal range of motion.     Cervical back: Neck supple.  Skin:    General: Skin is warm and dry.     Capillary Refill: Capillary refill takes less than 2 seconds.  Neurological:     General: No focal deficit present.     Mental Status: She is alert and oriented to person, place, and time.     Cranial Nerves: No cranial nerve deficit.     Sensory: No sensory deficit.     Motor: No weakness.     ED Results / Procedures / Treatments   Labs (all labs ordered are listed, but only abnormal results are displayed) Labs Reviewed  COMPREHENSIVE METABOLIC PANEL - Abnormal; Notable for the following components:      Result Value   Potassium 3.3 (*)    CO2 21 (*)    AST 14 (*)    All other components within normal limits  CBC WITH DIFFERENTIAL/PLATELET - Abnormal; Notable for the  following components:   RBC 5.15 (*)    MCV 79.0 (*)    All other components within normal limits  LIPASE, BLOOD  D-DIMER, QUANTITATIVE (NOT AT [facility])  TROPONIN I (HIGH SENSITIVITY)  TROPONIN I (HIGH SENSITIVITY)    EKG EKG Interpretation  Date/Time:  Wednesday May 21 2020 08:00:20 EDT Ventricular Rate:  111 PR Interval:    QRS Duration: 89 QT Interval:  339 QTC Calculation: 461 R Axis:   77 Text Interpretation: Sinus tachycardia Nonspecific T abnormalities, anterior leads No previous ECGs available Confirmed by [id number] [phone]) on 05/21/2020 8:40:28 AM  Radiology DG Chest Port 1 View  Result Date: 05/21/2020 CLINICAL DATA:  Chest pain, history GERD, recent upper endoscopy EXAM: PORTABLE CHEST 1 VIEW COMPARISON:  Portable exam 0828 hours compared to CT chest of 05/12/2020 FINDINGS: Normal heart size, mediastinal contours, and pulmonary vascularity. Lungs clear. No pulmonary infiltrate, pleural effusion or pneumothorax. Mild biconvex thoracic scoliosis. IMPRESSION: No acute abnormalities. Electronically Signed   By: Ulyses Southward M.D.   On: 05/21/2020 08:41    Procedures Procedures (including critical care time)  Medications Ordered in ED Medications  0.9 %  sodium chloride infusion (has no administration in time range)  sodium chloride 0.9 % bolus 1,000 mL (1,000 mLs Intravenous New Bag/Given 05/21/20 0846)  ondansetron (ZOFRAN) injection 4 mg (4 mg Intravenous Given 05/21/20 0846)  pantoprazole (PROTONIX) injection 40 mg (40 mg Intravenous Given 05/21/20 0846)    ED Course  I have reviewed the triage vital signs and the nursing notes.  Pertinent labs & imaging results that were available during my care of the patient were reviewed by me and considered in my medical decision making (see chart for details).    MDM Rules/Calculators/A&P                           Patient presented with sinus tachycardia.  But after treatment patient's heart rate was down into the  80s.  Troponin normal labs without significant abnormalities other than a mild hypokalemia.  Patient's D-dimer also was normal.  No concerns for pulmonary embolus.  This x-ray without any acute findings.  Cardiac monitoring showed no arrhythmias. Liver function tests were normal.   Patient treated with Zofran IV fluids and Protonix IV.  Patient already takes Protonix but did not have a dose today.  Patient feeling much better with treatment.  Will discharge patient have her follow-up with her gastroenterology doctor that is still in the process of her work-up.  Patient will return for any new or worse symptoms. Final Clinical Impression(s) / ED Diagnoses Final diagnoses:  Gastroesophageal reflux disease, unspecified whether esophagitis present    Rx / DC Orders ED Discharge Orders         Ordered    ondansetron (ZOFRAN ODT) 4 MG disintegrating tablet  Every 8 hours PRN     Discontinue  Reprint     05/21/20 1042           Vanetta Mulders, MD 05/21/20 1050

## 2020-05-21 NOTE — Discharge Instructions (Signed)
Continue your current medications.  Follow up with your gastroenterologist.  Return for any new or worse symptoms.  Today's work-up without any significant abnormalities.  Continue your Protonix.  Prescription for Zofran provided that may help with some of the nausea.

## 2020-05-21 NOTE — ED Triage Notes (Signed)
Pt reports she has gerd and had an endoscopy approx 1 month ago.  Reports found "something pushing on her esophagus."  Pt says feels like something is in her chest and hurts in her r breast and r shoulder blade.  Reports started vomiting yesterday and reports heart palpitations.  Has an appt in oct with another doctor to evaluate the findings.

## 2020-07-07 ENCOUNTER — Other Ambulatory Visit (HOSPITAL_COMMUNITY)
Admission: RE | Admit: 2020-07-07 | Discharge: 2020-07-07 | Disposition: A | Discharge status: Home / Self Care | Payer: Commercial Managed Care - PPO | Source: Ambulatory Visit | Attending: Gastroenterology | Admitting: Gastroenterology

## 2020-07-07 DIAGNOSIS — Z20822 Contact with and (suspected) exposure to covid-19: Secondary | ICD-10-CM | POA: Insufficient documentation

## 2020-07-07 DIAGNOSIS — Z01812 Encounter for preprocedural laboratory examination: Secondary | ICD-10-CM | POA: Insufficient documentation

## 2020-07-07 LAB — SARS CORONAVIRUS 2 (TAT 6-24 HRS): SARS Coronavirus 2: NEGATIVE

## 2020-07-08 ENCOUNTER — Encounter (HOSPITAL_COMMUNITY): Payer: Self-pay | Admitting: Emergency Medicine

## 2020-07-08 ENCOUNTER — Other Ambulatory Visit: Payer: Self-pay

## 2020-07-08 ENCOUNTER — Inpatient Hospital Stay (HOSPITAL_COMMUNITY)
Admission: EM | Admit: 2020-07-08 | Discharge: 2020-07-12 | DRG: 418 | Disposition: A | Discharge status: Home / Self Care | Payer: Commercial Managed Care - PPO | Attending: Internal Medicine | Admitting: Internal Medicine

## 2020-07-08 ENCOUNTER — Inpatient Hospital Stay (HOSPITAL_COMMUNITY): Payer: Commercial Managed Care - PPO

## 2020-07-08 ENCOUNTER — Emergency Department (HOSPITAL_COMMUNITY): Payer: Commercial Managed Care - PPO

## 2020-07-08 ENCOUNTER — Telehealth: Payer: Self-pay

## 2020-07-08 DIAGNOSIS — Z800 Family history of malignant neoplasm of digestive organs: Secondary | ICD-10-CM | POA: Diagnosis not present

## 2020-07-08 DIAGNOSIS — K219 Gastro-esophageal reflux disease without esophagitis: Secondary | ICD-10-CM | POA: Diagnosis present

## 2020-07-08 DIAGNOSIS — Z79899 Other long term (current) drug therapy: Secondary | ICD-10-CM

## 2020-07-08 DIAGNOSIS — D649 Anemia, unspecified: Secondary | ICD-10-CM | POA: Diagnosis present

## 2020-07-08 DIAGNOSIS — R7401 Elevation of levels of liver transaminase levels: Secondary | ICD-10-CM | POA: Diagnosis not present

## 2020-07-08 DIAGNOSIS — Z01812 Encounter for preprocedural laboratory examination: Secondary | ICD-10-CM | POA: Diagnosis not present

## 2020-07-08 DIAGNOSIS — K859 Acute pancreatitis without necrosis or infection, unspecified: Secondary | ICD-10-CM | POA: Diagnosis not present

## 2020-07-08 DIAGNOSIS — R112 Nausea with vomiting, unspecified: Secondary | ICD-10-CM | POA: Diagnosis not present

## 2020-07-08 DIAGNOSIS — Z20822 Contact with and (suspected) exposure to covid-19: Secondary | ICD-10-CM | POA: Diagnosis present

## 2020-07-08 DIAGNOSIS — R1013 Epigastric pain: Secondary | ICD-10-CM | POA: Diagnosis present

## 2020-07-08 DIAGNOSIS — K8050 Calculus of bile duct without cholangitis or cholecystitis without obstruction: Secondary | ICD-10-CM

## 2020-07-08 DIAGNOSIS — R932 Abnormal findings on diagnostic imaging of liver and biliary tract: Secondary | ICD-10-CM | POA: Diagnosis not present

## 2020-07-08 DIAGNOSIS — R1084 Generalized abdominal pain: Secondary | ICD-10-CM | POA: Diagnosis not present

## 2020-07-08 DIAGNOSIS — K851 Biliary acute pancreatitis without necrosis or infection: Secondary | ICD-10-CM | POA: Diagnosis not present

## 2020-07-08 DIAGNOSIS — E876 Hypokalemia: Secondary | ICD-10-CM | POA: Diagnosis present

## 2020-07-08 DIAGNOSIS — K5900 Constipation, unspecified: Secondary | ICD-10-CM

## 2020-07-08 DIAGNOSIS — K222 Esophageal obstruction: Secondary | ICD-10-CM | POA: Diagnosis not present

## 2020-07-08 DIAGNOSIS — K8071 Calculus of gallbladder and bile duct without cholecystitis with obstruction: Secondary | ICD-10-CM | POA: Diagnosis present

## 2020-07-08 DIAGNOSIS — R1010 Upper abdominal pain, unspecified: Secondary | ICD-10-CM

## 2020-07-08 DIAGNOSIS — Z91010 Allergy to peanuts: Secondary | ICD-10-CM

## 2020-07-08 DIAGNOSIS — R1011 Right upper quadrant pain: Secondary | ICD-10-CM

## 2020-07-08 DIAGNOSIS — R52 Pain, unspecified: Secondary | ICD-10-CM

## 2020-07-08 LAB — CBC WITH DIFFERENTIAL/PLATELET
Abs Immature Granulocytes: 0.02 10*3/uL (ref 0.00–0.07)
Basophils Absolute: 0 10*3/uL (ref 0.0–0.1)
Basophils Relative: 0 %
Eosinophils Absolute: 0.1 10*3/uL (ref 0.0–0.5)
Eosinophils Relative: 1 %
HCT: 39.8 % (ref 36.0–46.0)
Hemoglobin: 13.7 g/dL (ref 12.0–15.0)
Immature Granulocytes: 0 %
Lymphocytes Relative: 26 %
Lymphs Abs: 2.4 10*3/uL (ref 0.7–4.0)
MCH: 27.1 pg (ref 26.0–34.0)
MCHC: 34.4 g/dL (ref 30.0–36.0)
MCV: 78.7 fL — ABNORMAL LOW (ref 80.0–100.0)
Monocytes Absolute: 0.9 10*3/uL (ref 0.1–1.0)
Monocytes Relative: 10 %
Neutro Abs: 5.8 10*3/uL (ref 1.7–7.7)
Neutrophils Relative %: 63 %
Platelets: 316 10*3/uL (ref 150–400)
RBC: 5.06 MIL/uL (ref 3.87–5.11)
RDW: 12.5 % (ref 11.5–15.5)
WBC: 9.2 10*3/uL (ref 4.0–10.5)
nRBC: 0 % (ref 0.0–0.2)

## 2020-07-08 LAB — LIPASE, BLOOD: Lipase: 301 U/L — ABNORMAL HIGH (ref 11–51)

## 2020-07-08 LAB — COMPREHENSIVE METABOLIC PANEL
ALT: 330 U/L — ABNORMAL HIGH (ref 0–44)
AST: 234 U/L — ABNORMAL HIGH (ref 15–41)
Albumin: 4 g/dL (ref 3.5–5.0)
Alkaline Phosphatase: 187 U/L — ABNORMAL HIGH (ref 38–126)
Anion gap: 9 (ref 5–15)
BUN: 5 mg/dL — ABNORMAL LOW (ref 6–20)
CO2: 23 mmol/L (ref 22–32)
Calcium: 8.9 mg/dL (ref 8.9–10.3)
Chloride: 104 mmol/L (ref 98–111)
Creatinine, Ser: 0.58 mg/dL (ref 0.44–1.00)
GFR calc Af Amer: >60 mL/min (ref >60)
GFR calc non Af Amer: >60 mL/min (ref >60)
Glucose, Bld: 98 mg/dL (ref 70–99)
Potassium: 3.4 mmol/L — ABNORMAL LOW (ref 3.5–5.1)
Sodium: 136 mmol/L (ref 135–145)
Total Bilirubin: 2.6 mg/dL — ABNORMAL HIGH (ref 0.3–1.2)
Total Protein: 6.8 g/dL (ref 6.5–8.1)

## 2020-07-08 LAB — RESPIRATORY PANEL BY RT PCR (FLU A&B, COVID)
Influenza A by PCR: NEGATIVE
Influenza B by PCR: NEGATIVE
SARS Coronavirus 2 by RT PCR: NEGATIVE

## 2020-07-08 LAB — LIPID PANEL
Cholesterol: 176 mg/dL (ref 0–200)
HDL: 80 mg/dL (ref >40)
LDL Cholesterol: 83 mg/dL (ref 0–99)
Total CHOL/HDL Ratio: 2.2 RATIO
Triglycerides: 63 mg/dL (ref <150)
VLDL: 13 mg/dL (ref 0–40)

## 2020-07-08 LAB — HCG, QUANTITATIVE, PREGNANCY: hCG, Beta Chain, Quant, S: <1 m[IU]/mL (ref <5)

## 2020-07-08 LAB — HIV ANTIBODY (ROUTINE TESTING W REFLEX): HIV Screen 4th Generation wRfx: NONREACTIVE

## 2020-07-08 LAB — MAGNESIUM: Magnesium: 2.2 mg/dL (ref 1.7–2.4)

## 2020-07-08 IMAGING — CT CT ABD-PELV W/ CM
2 of 4 series · 16 of 46 positions shown, 18 images · IV contrast (Omnipaque or Isovue)
Comparison: None.

CLINICAL DATA: Epigastric pain.

EXAM:
CT ABDOMEN AND PELVIS WITH CONTRAST
TECHNIQUE: Multidetector CT imaging of the abdomen and pelvis was performed
using the standard protocol following bolus administration of
intravenous contrast.
CONTRAST:  100mL OMNIPAQUE IOHEXOL 300 MG/ML  SOLN

[Series 2: axial st · axial · 0.65mm/px · z∈[-396,+24]mm · 13 of 92 slices shown, 15 images]
[im 4/92  soft-tissue]
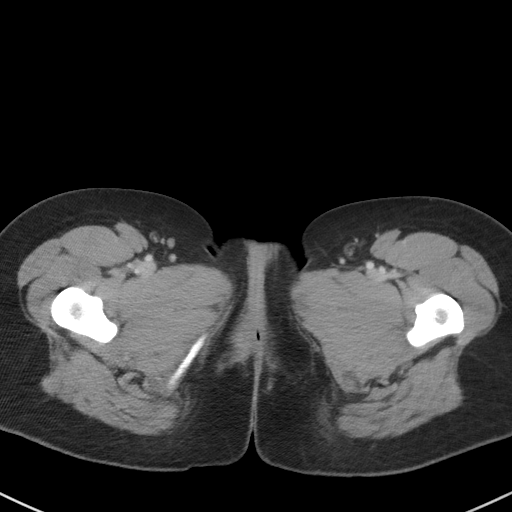
[im 4/92  bone]
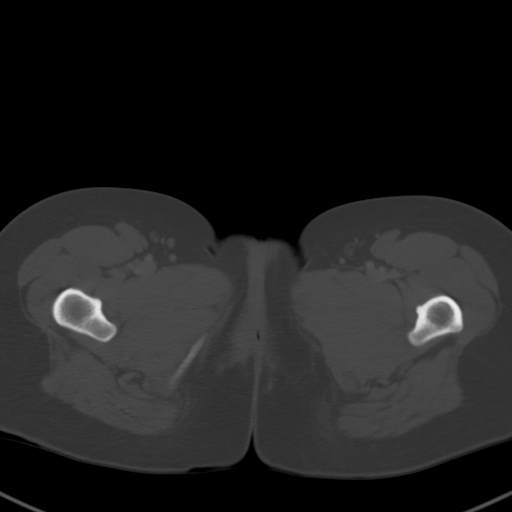
[im 12/92  soft-tissue]
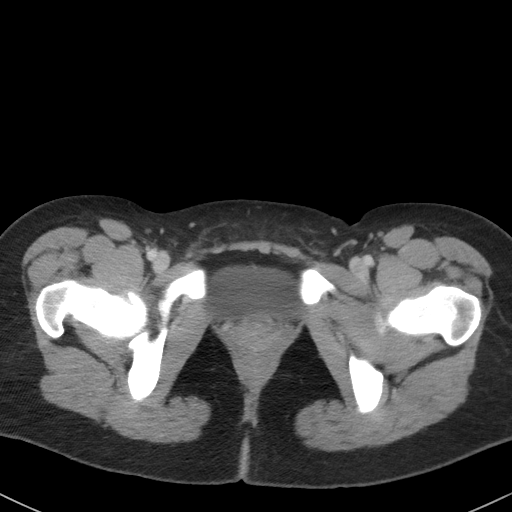
[im 19/92  soft-tissue]
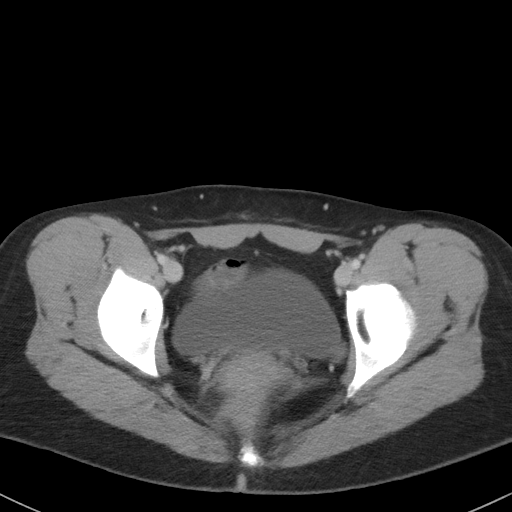
[im 27/92  soft-tissue]
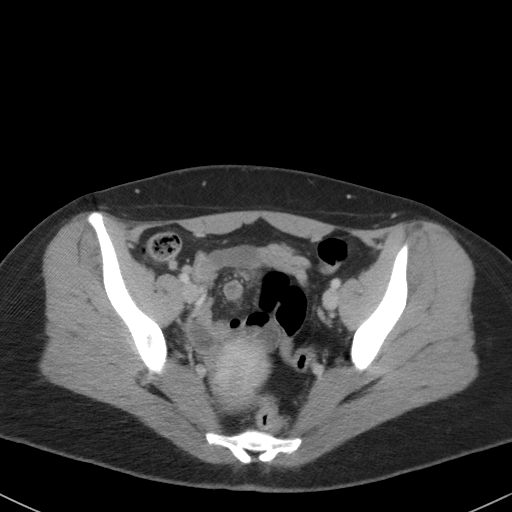
[im 31/92  soft-tissue]
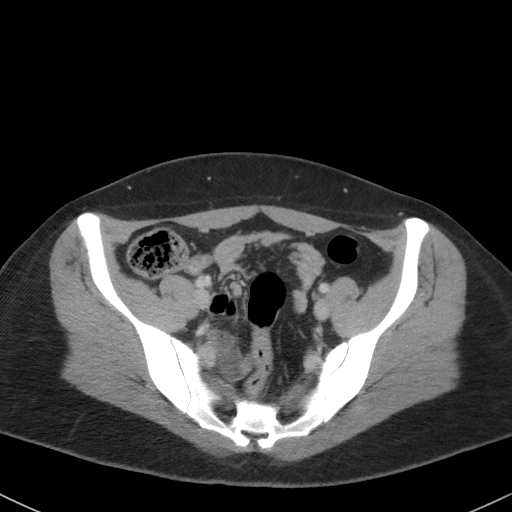
[im 38/92  soft-tissue]
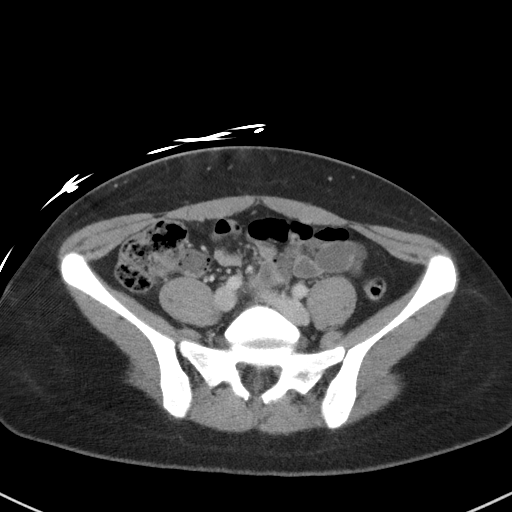
[im 46/92  soft-tissue]
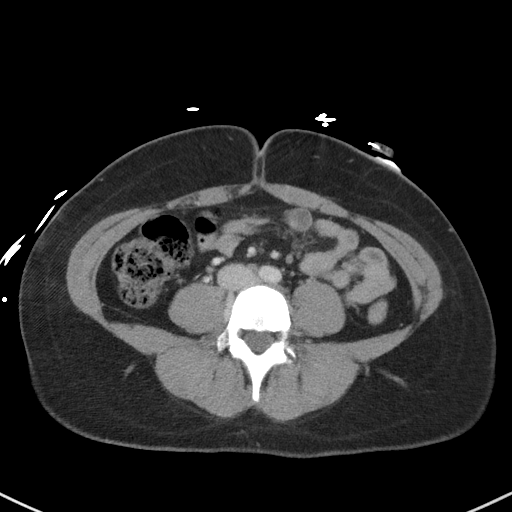
[im 54/92  soft-tissue]
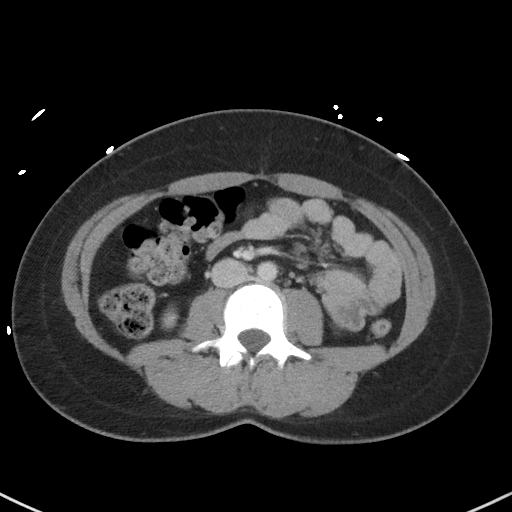
[im 61/92  soft-tissue]
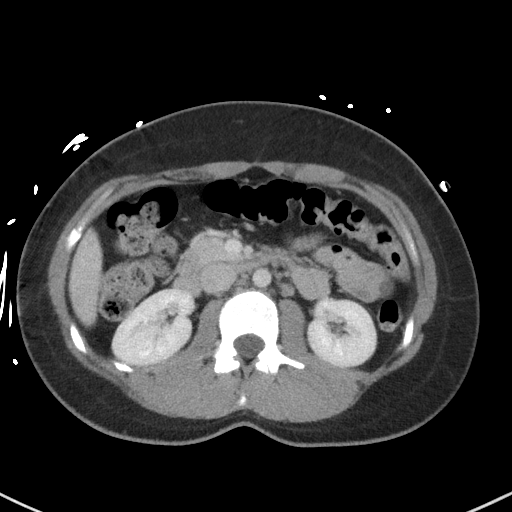
[im 61/92  bone]
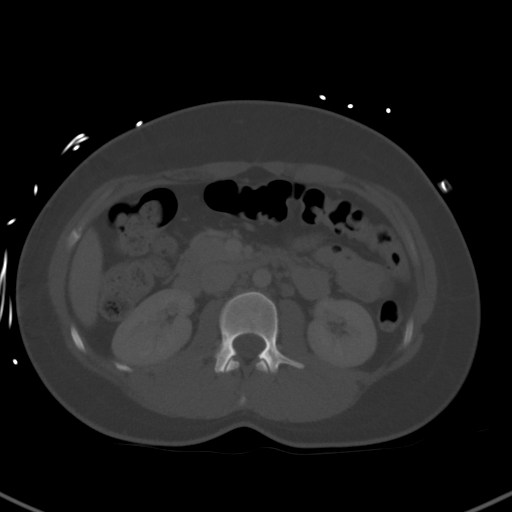
[im 65/92  soft-tissue]
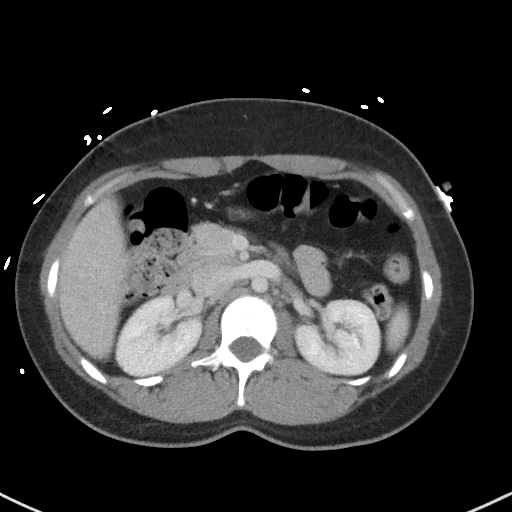
[im 73/92  soft-tissue]
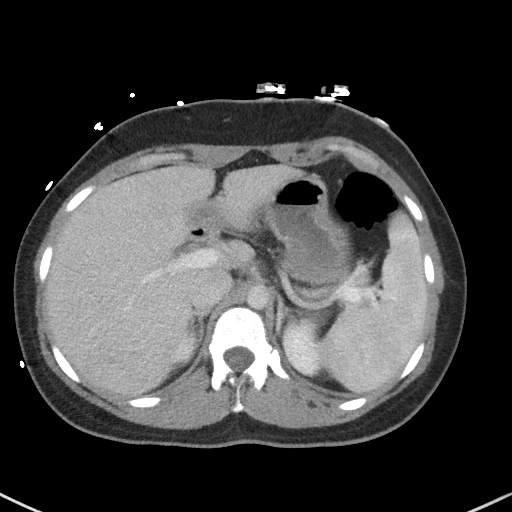
[im 80/92  soft-tissue]
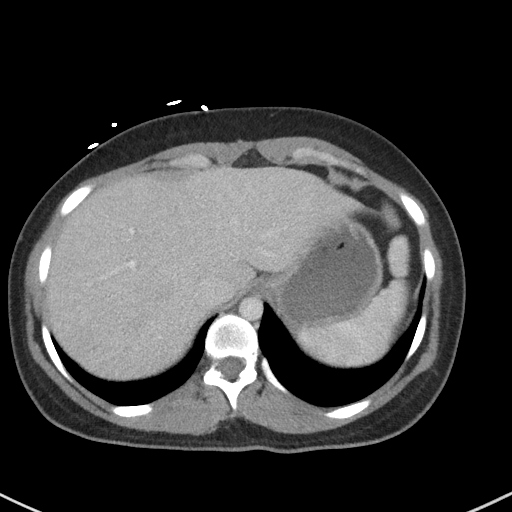
[im 88/92  soft-tissue]
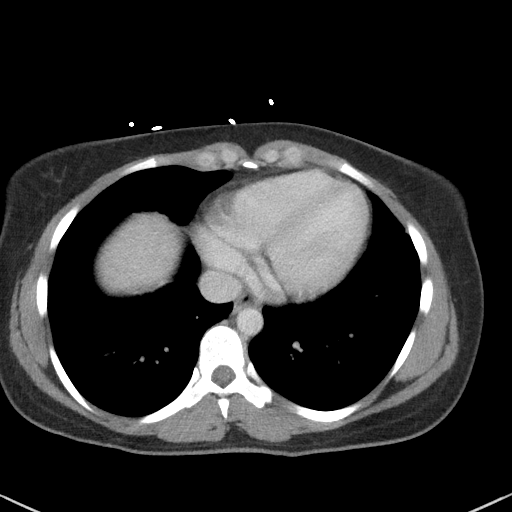

[Series 5: coronal st · coronal · 0.79mm/px · 3 of 72 slices shown]
[im 24/72  soft-tissue]
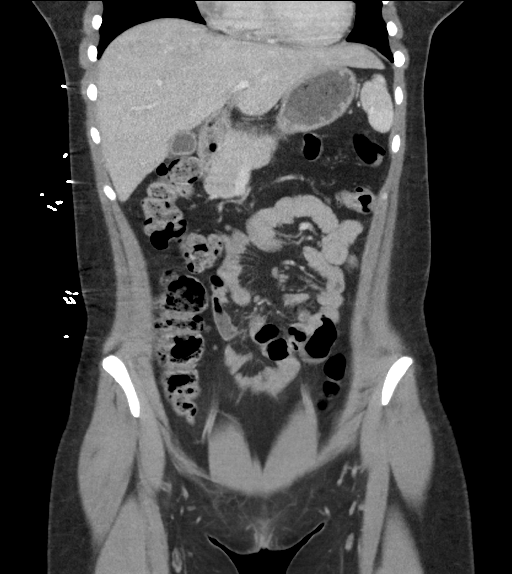
[im 32/72  soft-tissue]
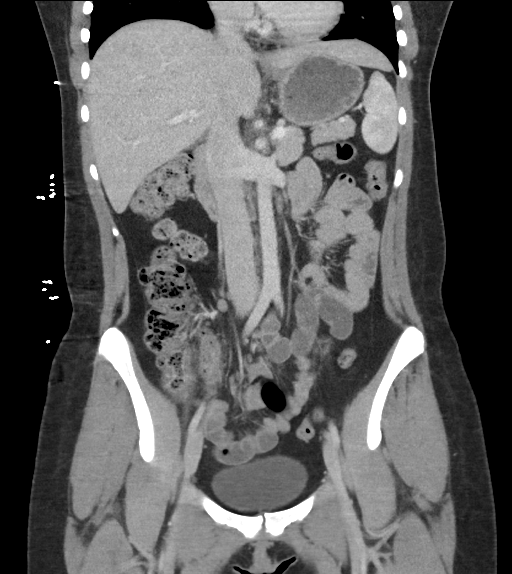
[im 40/72  soft-tissue]
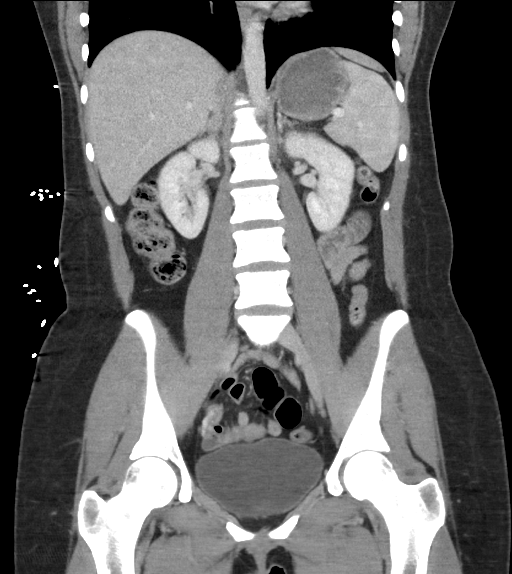

[16 of 46 positions shown; findings below may reference images not displayed]

FINDINGS: Lower chest: Unremarkable.

Hepatobiliary: No suspicious focal abnormality within the liver
parenchyma. Gallbladder is nondistended with ill definition of the
gallbladder wall and potential mild gallbladder wall thickening. No
intrahepatic or extrahepatic biliary dilation.

Pancreas: No focal mass lesion. No dilatation of the main duct. No
intraparenchymal cyst. No peripancreatic edema.

Spleen: No splenomegaly. No focal mass lesion.

Adrenals/Urinary Tract: No adrenal nodule or mass. Kidneys
unremarkable. No evidence for hydroureter. The urinary bladder
appears normal for the degree of distention.

Stomach/Bowel: Stomach is unremarkable. No gastric wall thickening.
No evidence of outlet obstruction. No perigastric edema or
inflammation. Duodenum is normally positioned as is the ligament of
Treitz. No small bowel wall thickening. No small bowel dilatation.
The terminal ileum is normal. The appendix is normal. No gross
colonic mass. No colonic wall thickening.

Vascular/Lymphatic: No abdominal aortic aneurysm. No abdominal
aortic atherosclerotic calcification. There is no gastrohepatic or
hepatoduodenal ligament lymphadenopathy. No retroperitoneal or
mesenteric lymphadenopathy. No pelvic sidewall lymphadenopathy.

Reproductive: The uterus is unremarkable.  There is no adnexal mass.

Other: No intraperitoneal free fluid.

Musculoskeletal: No worrisome lytic or sclerotic osseous
abnormality.
IMPRESSION: 1. Gallbladder is nondistended with ill definition of the
gallbladder wall and potential mild gallbladder wall thickening. No
gallstones by CT. Right upper quadrant ultrasound may prove helpful
to further evaluate as clinically warranted.
2. Otherwise unremarkable exam.

## 2020-07-08 IMAGING — US US ABDOMEN LIMITED
1 series · 14 of 25 positions shown · non-contrast
Comparison: CT abdomen and pelvis [DATE]; abdominal
ultrasound [DATE]

CLINICAL DATA: Right upper quadrant pain

EXAM:
ULTRASOUND ABDOMEN LIMITED RIGHT UPPER QUADRANT

[Series 1: us abdomen limited ruq · 14 of 61 slices shown]
[im 1/61]
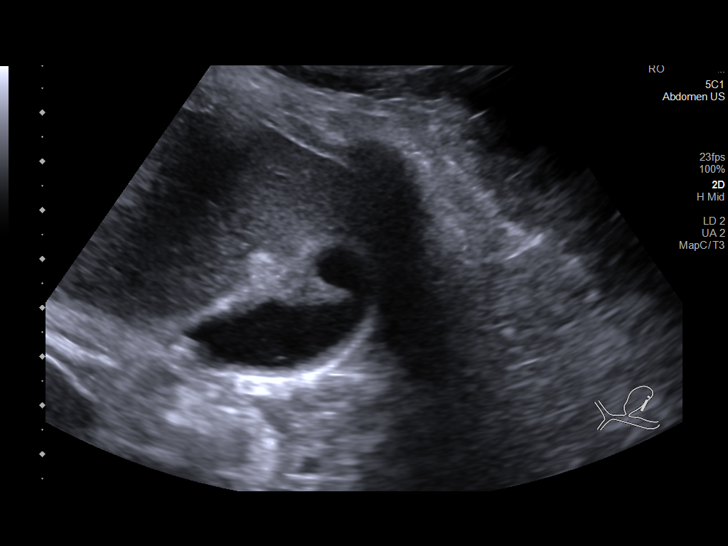
[im 6/61]
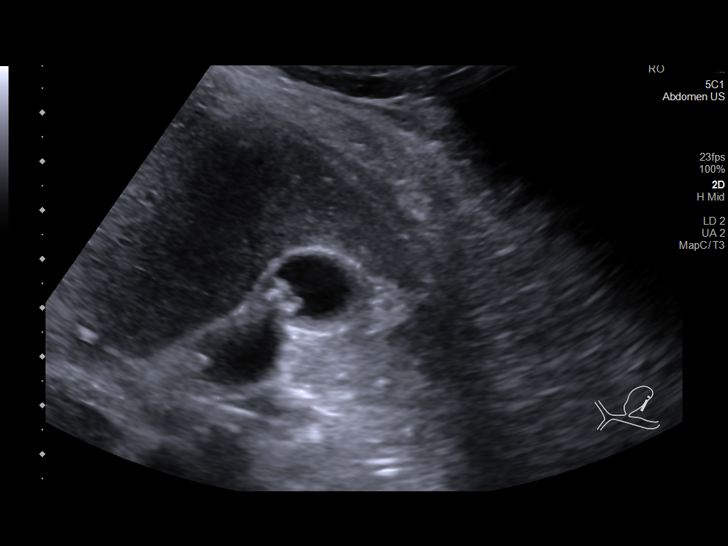
[im 11/61]
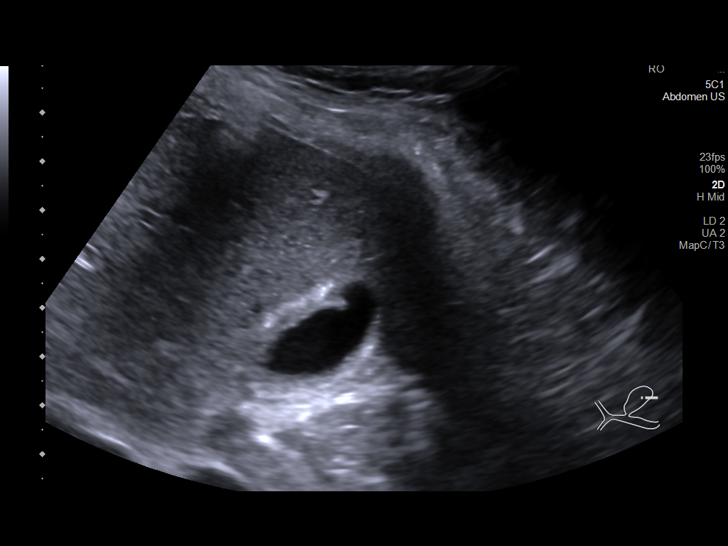
[im 16/61]
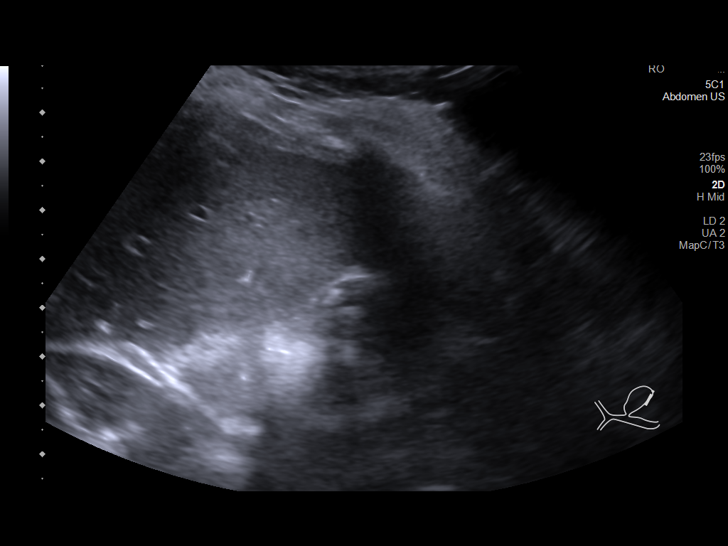
[im 21/61]
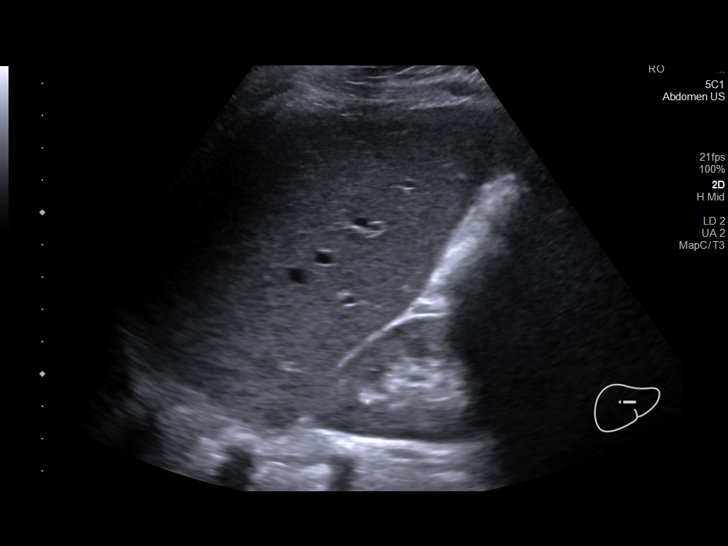
[im 23/61]
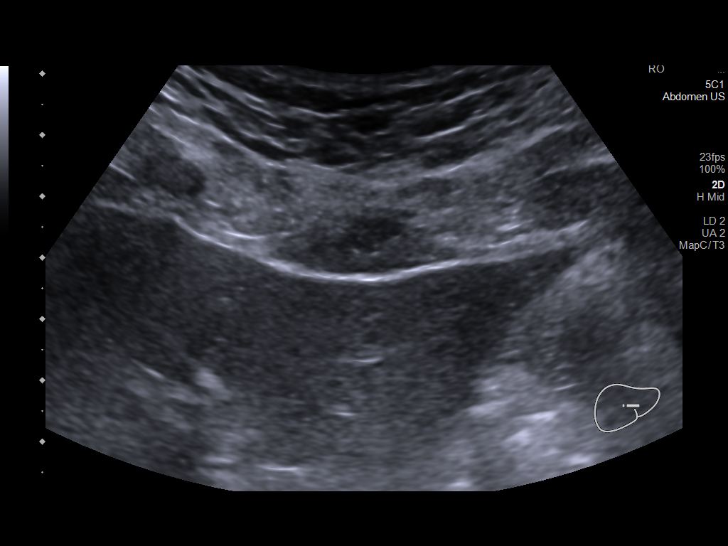
[im 28/61]
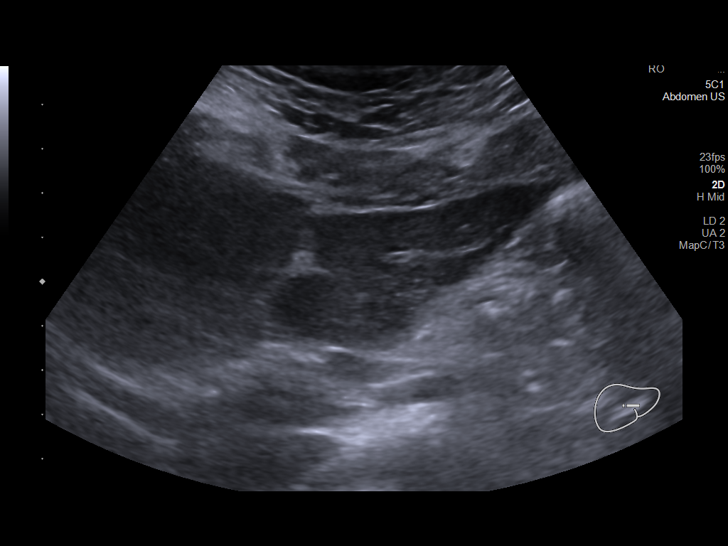
[im 33/61]
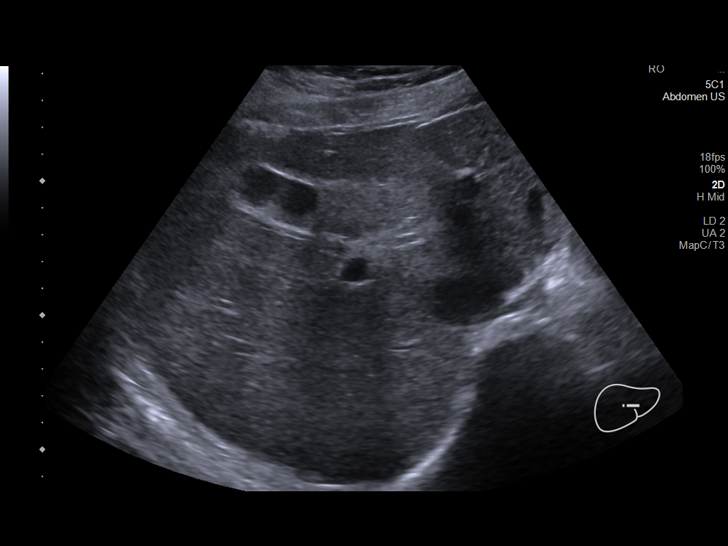
[im 38/61]
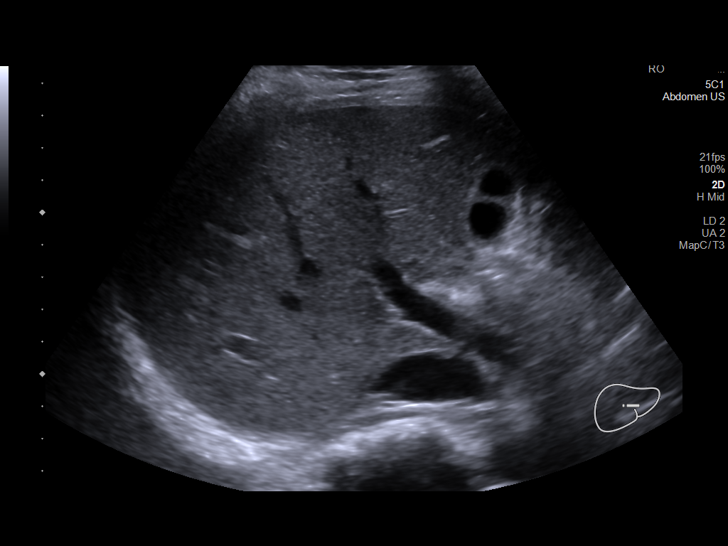
[im 41/61]
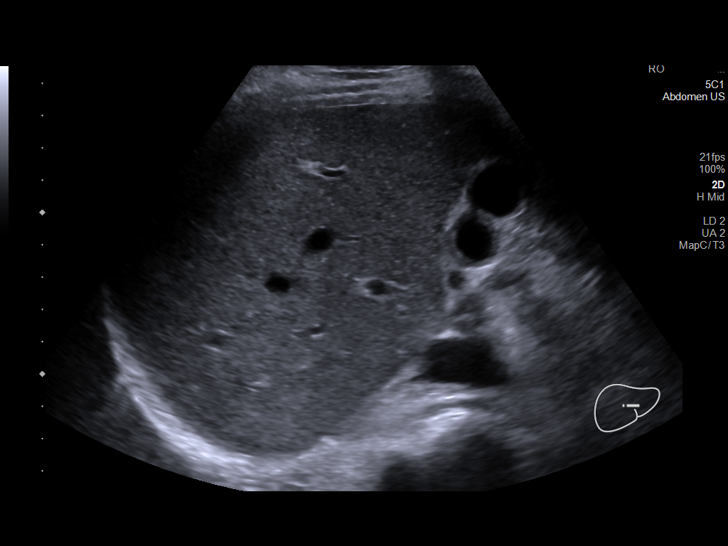
[im 46/61]
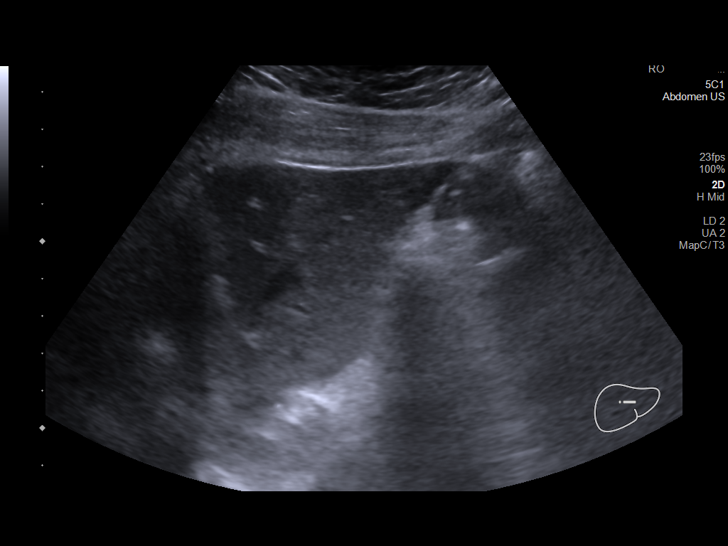
[im 51/61]
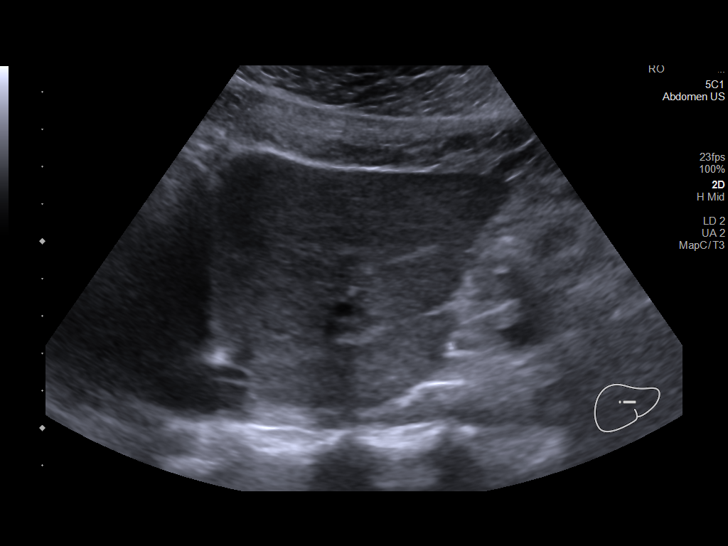
[im 56/61]
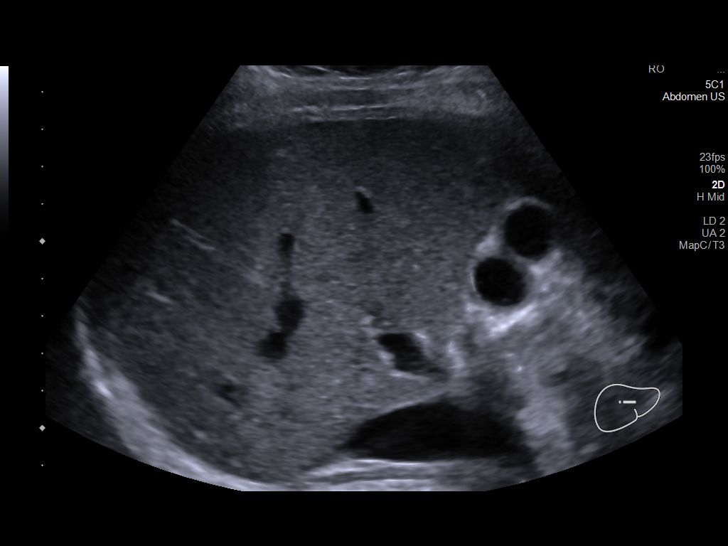
[im 61/61]
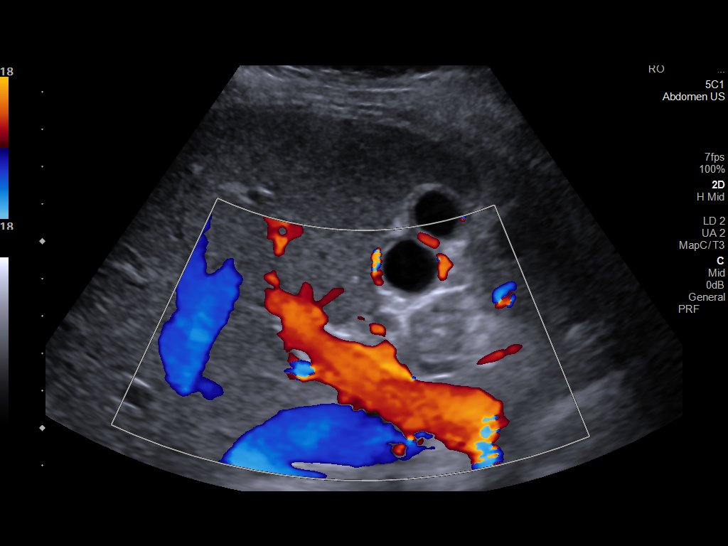

[14 of 25 positions shown; findings below may reference images not displayed]

FINDINGS: Gallbladder:

Within the gallbladder, there are echogenic foci which move and
shadow consistent with cholelithiasis. Gallbladder wall is
borderline thickened without edema. No pericholecystic fluid. No
sonographic Murphy sign noted by sonographer.

Common bile duct:

Diameter: 3 mm. No intrahepatic or extrahepatic biliary duct
dilatation.

Liver:

No focal lesion identified. Within normal limits in parenchymal
echogenicity. Portal vein is patent on color Doppler imaging with
normal direction of blood flow towards the liver.

Other: None.
IMPRESSION: Cholelithiasis with borderline gallbladder wall thickening. These
findings could be indicative of early acute cholecystitis. In this
regard, it may be prudent to correlate with nuclear medicine
hepatobiliary imaging study to assess for cystic duct patency.

Study otherwise unremarkable.

## 2020-07-08 IMAGING — MR MR ABDOMEN WO/W CM MRCP
19 of 22 series · 43 of 48 positions shown · IV contrast (7ML Gadavist)
Comparison: No prior abdominal MRI. CT the abdomen and pelvis
[DATE].

CLINICAL DATA: 19-year-old female with history of right upper
quadrant abdominal pain with elevated liver enzymes. Suspected
biliary tract obstruction and gallstone pancreatitis.

EXAM:
MRI ABDOMEN WITHOUT AND WITH CONTRAST (INCLUDING MRCP)
TECHNIQUE: Multiplanar multisequence MR imaging of the abdomen was performed
both before and after the administration of intravenous contrast.
Heavily T2-weighted images of the biliary and pancreatic ducts were
obtained, and three-dimensional MRCP images were rendered by post
processing.
CONTRAST:  7mL GADAVIST GADOBUTROL 1 MMOL/ML IV SOLN

[Series 4: ax haste · axial · 6.0mm · 1.19mm/px · 1 of 30 slices shown]
[im 1/30]
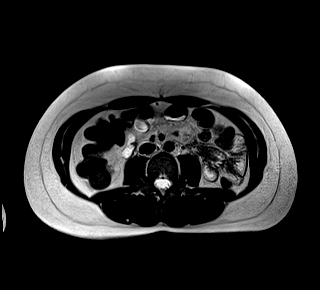

[Series 5: cor haste · coronal · 6.0mm · 1.25mm/px · 1 of 26 slices shown]
[im 1/26]
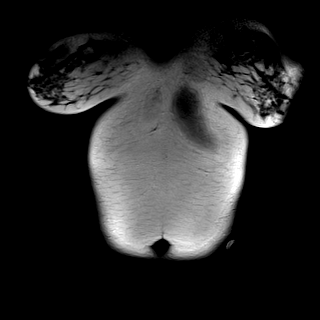

[Series 7: T2 fat-sat · axial · 6.0mm · 1.19mm/px · 1 of 30 slices shown]
[im 1/30]
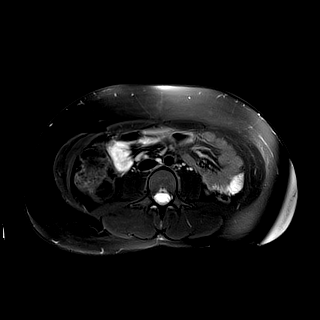

[Series 8: DWI · axial · 6.0mm · 1.42mm/px · 1 of 30 slices shown (1 of 4)]
[im 1/30]
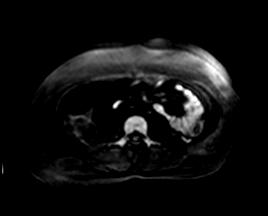

[Series 8: DWI · axial · 6.0mm · 1.42mm/px · 1 of 30 slices shown (2 of 4)]
[im 1/30]
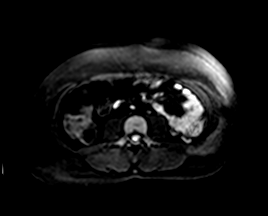

[Series 8: DWI · axial · 6.0mm · 1.42mm/px · 1 of 30 slices shown (3 of 4)]
[im 1/30]
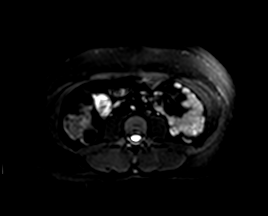

[Series 9: DWI · axial · 6.0mm · 1.42mm/px · 1 of 30 slices shown (4 of 4)]
[im 1/30]
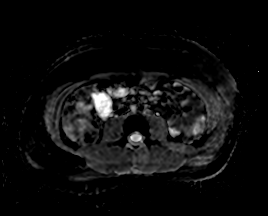

[Series 10: ax in and · axial · 3.0mm · 1.19mm/px · z∈[+11,+224]mm · 3 of 72 slices shown (1 of 2)]
[im 1/72]
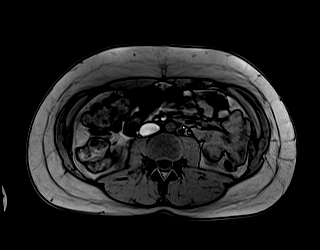
[im 36/72]
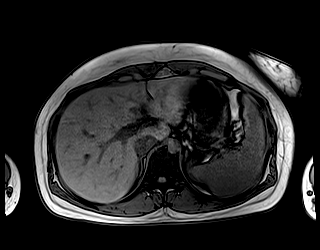
[im 72/72]
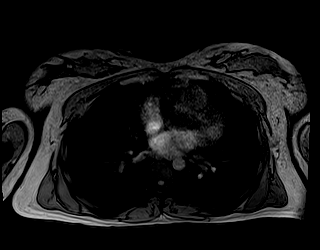

[Series 11: ax in and · axial · 3.0mm · 1.19mm/px · z∈[+11,+224]mm · 3 of 72 slices shown (2 of 2)]
[im 1/72]
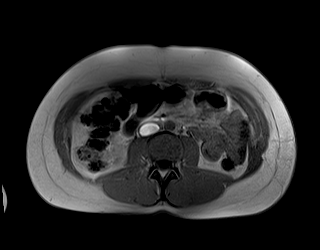
[im 36/72]
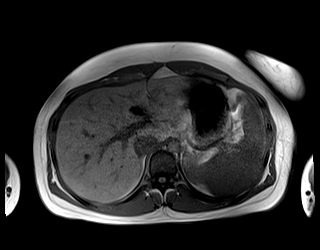
[im 72/72]
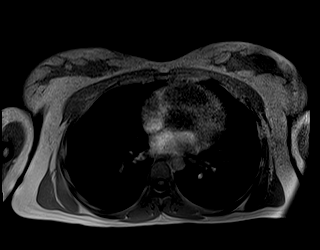

[Series 16: T1 dynamic · axial · non-contrast · 3.0mm · 1.19mm/px · z∈[+18,+231]mm · 3 of 72 slices shown (1 of 4)]
[im 1/72]
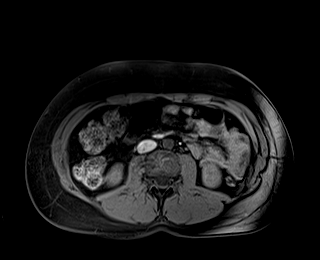
[im 36/72]
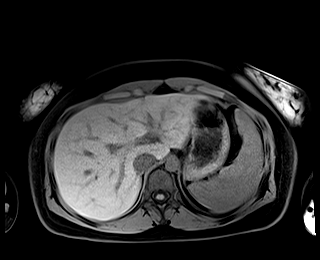
[im 72/72]
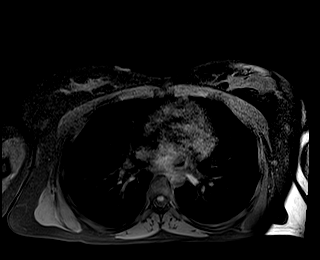

[Series 18: T1 dynamic post-contrast · axial · 3.0mm · 1.19mm/px · z∈[+18,+231]mm · 3 of 72 slices shown (1 of 6)]
[im 1/72]
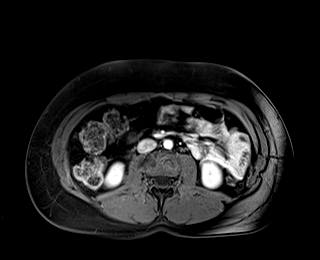
[im 36/72]
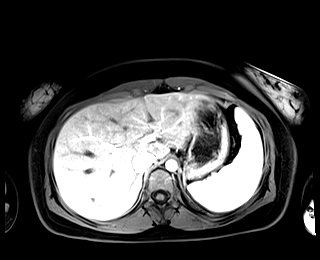
[im 72/72]
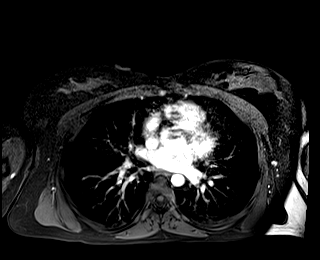

[Series 19: T1 dynamic · axial · 3.0mm · 1.19mm/px · z∈[+18,+231]mm · 3 of 72 slices shown (2 of 4)]
[im 1/72]
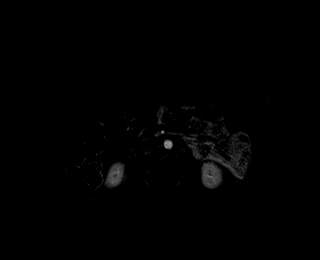
[im 36/72]
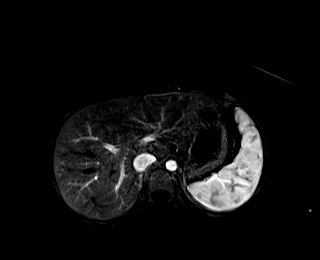
[im 72/72]
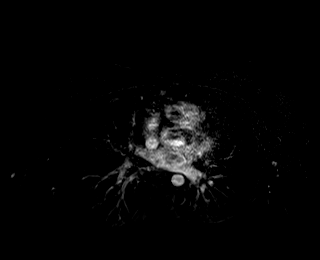

[Series 20: T1 dynamic post-contrast · axial · 3.0mm · 1.19mm/px · z∈[+18,+231]mm · 3 of 72 slices shown (2 of 6)]
[im 1/72]
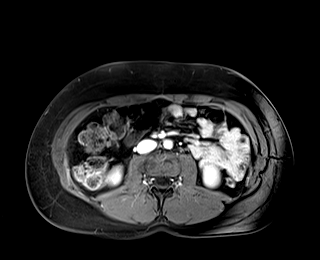
[im 36/72]
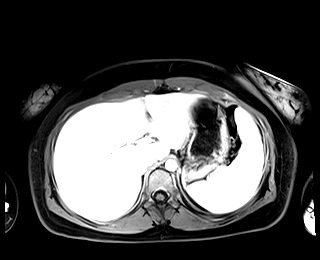
[im 72/72]
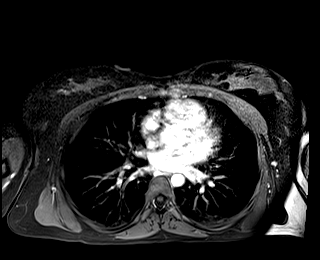

[Series 21: T1 dynamic · axial · 3.0mm · 1.19mm/px · z∈[+18,+231]mm · 3 of 72 slices shown (3 of 4)]
[im 1/72]
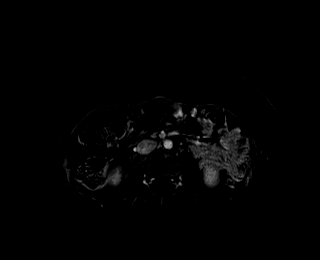
[im 36/72]
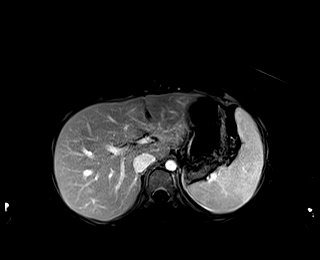
[im 72/72]
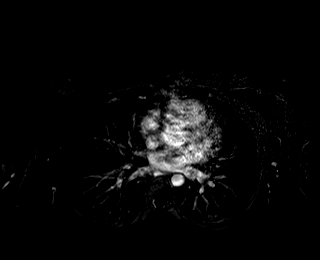

[Series 22: T1 dynamic post-contrast · axial · 3.0mm · 1.19mm/px · z∈[+18,+231]mm · 3 of 72 slices shown (3 of 6)]
[im 1/72]
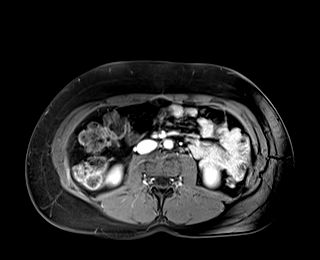
[im 36/72]
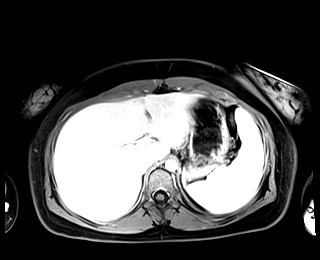
[im 72/72]
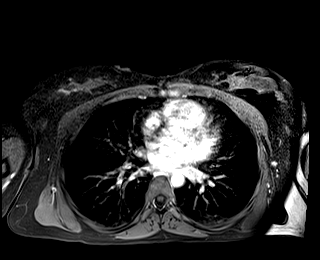

[Series 23: T1 dynamic · axial · 3.0mm · 1.19mm/px · z∈[+18,+231]mm · 3 of 72 slices shown (4 of 4)]
[im 1/72]
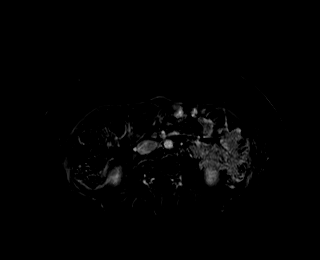
[im 36/72]
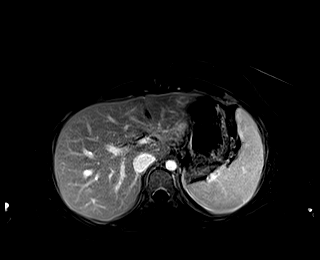
[im 72/72]
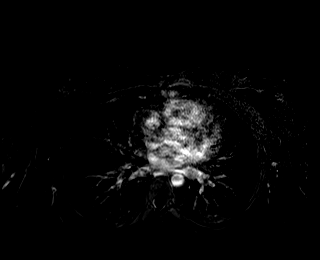

[Series 24: T1 dynamic post-contrast · coronal · 3.0mm · 1.31mm/px · 3 of 72 slices shown (4 of 6)]
[im 1/72]
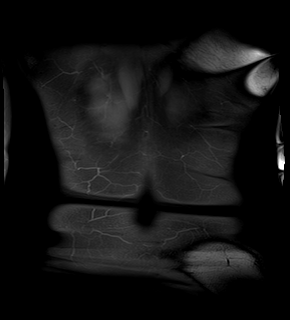
[im 36/72]
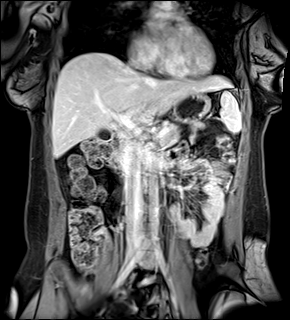
[im 72/72]
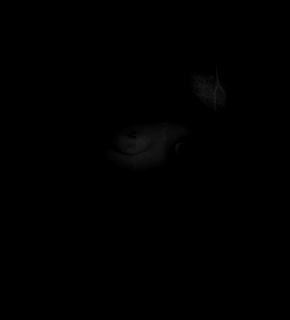

[Series 25: T1 dynamic post-contrast · axial · 3.0mm · 1.19mm/px · z∈[+18,+231]mm · 3 of 72 slices shown (5 of 6)]
[im 1/72]
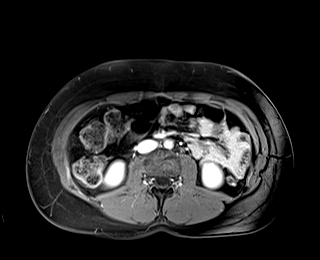
[im 36/72]
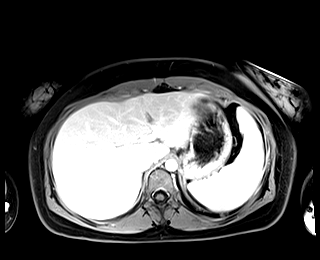
[im 72/72]
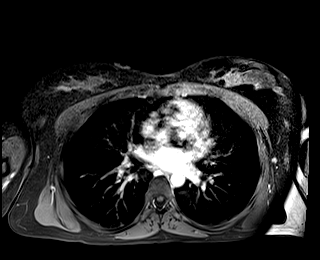

[Series 26: T1 dynamic post-contrast · axial · 3.0mm · 1.19mm/px · z∈[+18,+231]mm · 3 of 72 slices shown (6 of 6)]
[im 1/72]
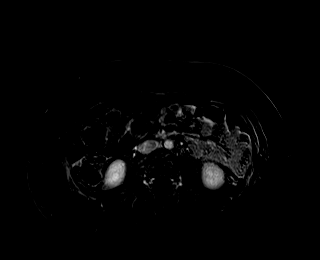
[im 36/72]
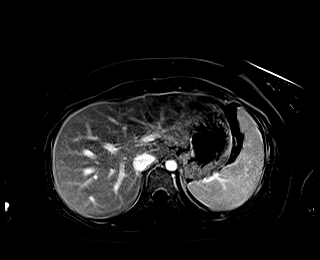
[im 72/72]
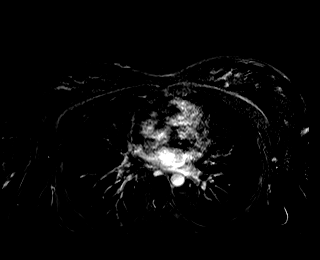

[43 of 48 positions shown; findings below may reference images not displayed]

FINDINGS: Lower chest: Unremarkable.

Hepatobiliary: No suspicious cystic or solid hepatic lesions. Small
filling defects are noted near the fundus of the gallbladder,
compatible with tiny gallstones. Gallbladder does not appear
distended. There is no gallbladder wall thickening or
pericholecystic fluid. No intra or extrahepatic biliary ductal
dilatation noted on MRCP images. Common bile duct measures 4 mm in
the porta hepatis. No filling defects in the common bile duct to
suggest choledocholithiasis.

Pancreas: No pancreatic mass. No pancreatic ductal dilatation. No
pancreatic or peripancreatic fluid collections or inflammatory
changes.

Spleen:  Unremarkable.

Adrenals/Urinary Tract: Bilateral kidneys and adrenal glands are
normal in appearance. No hydroureteronephrosis in the visualized
portions of the abdomen.

Stomach/Bowel: Visualized portions are unremarkable.

Vascular/Lymphatic: No aneurysm identified in the visualized
abdominal vasculature. No lymphadenopathy noted in the abdomen.

Other: No significant volume of ascites noted in the visualized
portions of the peritoneal cavity.

Musculoskeletal: No aggressive appearing osseous lesions are noted
in visualized portions of the peritoneal cavity.
IMPRESSION: 1. Cholelithiasis without evidence of acute cholecystitis.
2. No evidence of choledocholithiasis. No findings of biliary tract
obstruction.
3. No imaging findings to suggest an acute pancreatitis at this
time.

## 2020-07-08 MED ORDER — ONDANSETRON HCL 4 MG/2ML IJ SOLN
4.0000 mg | Freq: Once | INTRAMUSCULAR | Status: AC
  Administered 2020-07-08: 4 mg via INTRAVENOUS
  Filled 2020-07-08: qty 2

## 2020-07-08 MED ORDER — ALUM & MAG HYDROXIDE-SIMETH 200-200-20 MG/5ML PO SUSP
30.0000 mL | Freq: Once | ORAL | Status: AC
  Administered 2020-07-08: 30 mL via ORAL
  Filled 2020-07-08: qty 30

## 2020-07-08 MED ORDER — ONDANSETRON HCL 4 MG/2ML IJ SOLN
4.0000 mg | Freq: Four times a day (QID) | INTRAMUSCULAR | Status: DC | PRN
  Administered 2020-07-08 – 2020-07-10 (×5): 4 mg via INTRAVENOUS
  Filled 2020-07-08 (×5): qty 2

## 2020-07-08 MED ORDER — POTASSIUM CHLORIDE 10 MEQ/100ML IV SOLN
10.0000 meq | INTRAVENOUS | Status: AC
  Administered 2020-07-08 (×4): 10 meq via INTRAVENOUS
  Filled 2020-07-08 (×4): qty 100

## 2020-07-08 MED ORDER — IOHEXOL 300 MG/ML  SOLN
100.0000 mL | Freq: Once | INTRAMUSCULAR | Status: AC | PRN
  Administered 2020-07-08: 100 mL via INTRAVENOUS

## 2020-07-08 MED ORDER — SODIUM CHLORIDE 0.9 % IV SOLN: INTRAVENOUS | Status: DC

## 2020-07-08 MED ORDER — PANTOPRAZOLE SODIUM 40 MG IV SOLR
40.0000 mg | INTRAVENOUS | Status: DC
  Administered 2020-07-08 – 2020-07-11 (×4): 40 mg via INTRAVENOUS
  Filled 2020-07-08 (×4): qty 40

## 2020-07-08 MED ORDER — SODIUM CHLORIDE 0.9 % IV BOLUS
1000.0000 mL | Freq: Once | INTRAVENOUS | Status: AC
  Administered 2020-07-08: 1000 mL via INTRAVENOUS

## 2020-07-08 MED ORDER — ACETAMINOPHEN 650 MG RE SUPP: 650.0000 mg | Freq: Four times a day (QID) | RECTAL | Status: DC | PRN

## 2020-07-08 MED ORDER — GADOBUTROL 1 MMOL/ML IV SOLN
7.0000 mL | Freq: Once | INTRAVENOUS | Status: AC | PRN
  Administered 2020-07-08: 7 mL via INTRAVENOUS

## 2020-07-08 MED ORDER — MORPHINE SULFATE (PF) 4 MG/ML IV SOLN
4.0000 mg | Freq: Once | INTRAVENOUS | Status: AC
  Administered 2020-07-08: 4 mg via INTRAVENOUS
  Filled 2020-07-08: qty 1

## 2020-07-08 MED ORDER — LACTATED RINGERS IV SOLN: INTRAVENOUS | Status: DC

## 2020-07-08 MED ORDER — ONDANSETRON HCL 4 MG PO TABS: 4.0000 mg | ORAL_TABLET | Freq: Four times a day (QID) | ORAL | Status: DC | PRN

## 2020-07-08 MED ORDER — ACETAMINOPHEN 325 MG PO TABS: 650.0000 mg | ORAL_TABLET | Freq: Four times a day (QID) | ORAL | Status: DC | PRN

## 2020-07-08 MED ORDER — ENOXAPARIN SODIUM 40 MG/0.4ML ~~LOC~~ SOLN
40.0000 mg | SUBCUTANEOUS | Status: DC
  Administered 2020-07-08 – 2020-07-09 (×2): 40 mg via SUBCUTANEOUS
  Filled 2020-07-08 (×2): qty 0.4

## 2020-07-08 MED ORDER — HYDROMORPHONE HCL 1 MG/ML IJ SOLN
0.2500 mg | INTRAMUSCULAR | Status: DC | PRN
  Administered 2020-07-08 (×2): 0.25 mg via INTRAVENOUS
  Administered 2020-07-09 (×2): 0.5 mg via INTRAVENOUS
  Administered 2020-07-09: 0.25 mg via INTRAVENOUS
  Administered 2020-07-09 – 2020-07-10 (×5): 0.5 mg via INTRAVENOUS
  Filled 2020-07-08 (×10): qty 0.5

## 2020-07-08 NOTE — ED Notes (Signed)
Patient transported to Ultrasound 

## 2020-07-08 NOTE — ED Provider Notes (Signed)
[facility] EMERGENCY DEPARTMENT Provider Note   CSN: [id number] Arrival date & time: 07/08/20  0034     History Chief Complaint  Patient presents with  . Back Pain    Annette Gould is a 19 y.o. female.  HPI     This is a 19 year old female with a history of reflux who presents with abdominal, chest, and back pain.  Patient reports waxing and waning symptoms over the last 4 days.  She states that the symptoms are associated with food intake.  She reports pressure-like chest discomfort and burning upper epigastric pain that radiates into her back.  Currently she rates her pain 8 out of 10.  She has been taking her reflux medication with minimal relief.  No nausea, vomiting, diarrhea.  No noted fevers.  Patient reports that she is scheduled for an endoscopy.  Past Medical History:  Diagnosis Date  . Bursitis    hips  . GERD (gastroesophageal reflux disease)   . Migraine headache    approx 2/month    Patient Active Problem List   Diagnosis Date Noted  . Abdominal pain 01/01/2020  . Nausea with vomiting 01/01/2020  . GERD (gastroesophageal reflux disease) 01/01/2020  . Constipation 01/01/2020    Past Surgical History:  Procedure Laterality Date  . BIOPSY N/A 04/15/2020   Procedure: BIOPSY;  Surgeon: Virgel Manifold, MD;  Location: [place];  Service: Endoscopy;  Laterality: N/A;  . DENTAL SURGERY    . ESOPHAGOGASTRODUODENOSCOPY (EGD) WITH PROPOFOL N/A 04/15/2020   Procedure: ESOPHAGOGASTRODUODENOSCOPY (EGD) WITH PROPOFOL;  Surgeon: Virgel Manifold, MD;  Location: [place];  Service: Endoscopy;  Laterality: N/A;  . TYMPANOSTOMY TUBE PLACEMENT       OB History   No obstetric history on file.     Family History  Problem Relation Age of Onset  . Gastric cancer Maternal Grandfather        "not sure where, but somewhere in the stomach area"  . Colon cancer Neg Hx     Social History   Tobacco Use  . Smoking status: Never Smoker  .  Smokeless tobacco: Never Used  . Tobacco comment: Smoked less than 1 pack per week for about 3 months  Vaping Use  . Vaping Use: Never used  Substance Use Topics  . Alcohol use: Not Currently    Comment: may have 1-2 drinks/month  . Drug use: Not Currently    Types: Marijuana    Comment: denies 01/01/20    Home Medications Prior to Admission medications   Medication Sig Start Date End Date Taking? Authorizing Provider  AUROVELA FE 1.5/30 1.5-30 MG-MCG tablet Take 1 tablet by mouth daily. 11/12/19   [provider]  ondansetron (ZOFRAN ODT) 4 MG disintegrating tablet Take 1 tablet (4 mg total) by mouth every 8 (eight) hours as needed. 05/21/20   Fredia Sorrow, MD  pantoprazole (PROTONIX) 40 MG tablet Take 1 tablet (40 mg total) by mouth 2 (two) times daily before a meal. 01/01/20   Carlis Stable, NP  propranolol (INDERAL) 10 MG tablet Take 10 mg by mouth daily.  11/12/19   [provider]  sertraline (ZOLOFT) 50 MG tablet Take 75 mg by mouth daily. 11/12/19   [provider]  famotidine (PEPCID) 20 MG tablet Take 1 tablet (20 mg total) by mouth 2 (two) times daily. 12/28/19 01/01/20  Evalee Jefferson, PA-C    Allergies    Peanut-containing drug products  Review of Systems   Review of  Systems  Constitutional: Negative for fever.  Respiratory: Negative for shortness of breath.   Cardiovascular: Negative for chest pain.  Gastrointestinal: Positive for abdominal pain. Negative for diarrhea, nausea and vomiting.  Genitourinary: Negative for dysuria.  All other systems reviewed and are negative.   Physical Exam Updated Vital Signs BP 132/85   Pulse 77   Temp 99.8 F (37.7 C) (Oral)   Resp 13   Ht 1.575 m (5' 2" )   Wt 63.5 kg   LMP 06/24/2020   SpO2 99%   BMI 25.61 kg/m   Physical Exam Vitals and nursing note reviewed.  Constitutional:      Appearance: She is well-developed.  HENT:     Head: Normocephalic and atraumatic.     Mouth/Throat:     Mouth:  Mucous membranes are moist.  Eyes:     Pupils: Pupils are equal, round, and reactive to light.  Cardiovascular:     Rate and Rhythm: Normal rate and regular rhythm.     Heart sounds: Normal heart sounds.  Pulmonary:     Effort: Pulmonary effort is normal. No respiratory distress.     Breath sounds: No wheezing.  Abdominal:     General: Bowel sounds are normal.     Palpations: Abdomen is soft.     Tenderness: There is abdominal tenderness. There is no guarding or rebound.     Comments: Epigastric and right upper quadrant tenderness palpation, no rebound or guarding  Musculoskeletal:     Cervical back: Neck supple.     Right lower leg: No edema.     Left lower leg: No edema.  Skin:    General: Skin is warm and dry.  Neurological:     Mental Status: She is alert and oriented to person, place, and time.  Psychiatric:        Mood and Affect: Mood normal.     ED Results / Procedures / Treatments   Labs (all labs ordered are listed, but only abnormal results are displayed) Labs Reviewed  CBC WITH DIFFERENTIAL/PLATELET - Abnormal; Notable for the following components:      Result Value   MCV 78.7 (*)    All other components within normal limits  COMPREHENSIVE METABOLIC PANEL - Abnormal; Notable for the following components:   Potassium 3.4 (*)    BUN 5 (*)    AST 234 (*)    ALT 330 (*)    Alkaline Phosphatase 187 (*)    Total Bilirubin 2.6 (*)    All other components within normal limits  LIPASE, BLOOD - Abnormal; Notable for the following components:   Lipase 301 (*)    All other components within normal limits  RESPIRATORY PANEL BY RT PCR (FLU A&B, COVID)  HCG, QUANTITATIVE, PREGNANCY    EKG EKG Interpretation  Date/Time:  Tuesday July 08 2020 02:15:05 EDT Ventricular Rate:  67 PR Interval:    QRS Duration: 93 QT Interval:  396 QTC Calculation: 418 R Axis:   82 Text Interpretation: Sinus rhythm Atrial premature complex Low voltage, precordial leads Confirmed  by Thayer Jew [phone]) on 07/08/2020 5:44:58 AM   Radiology CT ABDOMEN PELVIS W CONTRAST  Result Date: 07/08/2020 CLINICAL DATA:  Epigastric pain. EXAM: CT ABDOMEN AND PELVIS WITH CONTRAST TECHNIQUE: Multidetector CT imaging of the abdomen and pelvis was performed using the standard protocol following bolus administration of intravenous contrast. CONTRAST:  167m OMNIPAQUE IOHEXOL 300 MG/ML  SOLN COMPARISON:  None. FINDINGS: Lower chest: Unremarkable. Hepatobiliary: No suspicious focal abnormality within  the liver parenchyma. Gallbladder is nondistended with ill definition of the gallbladder wall and potential mild gallbladder wall thickening. No intrahepatic or extrahepatic biliary dilation. Pancreas: No focal mass lesion. No dilatation of the main duct. No intraparenchymal cyst. No peripancreatic edema. Spleen: No splenomegaly. No focal mass lesion. Adrenals/Urinary Tract: No adrenal nodule or mass. Kidneys unremarkable. No evidence for hydroureter. The urinary bladder appears normal for the degree of distention. Stomach/Bowel: Stomach is unremarkable. No gastric wall thickening. No evidence of outlet obstruction. No perigastric edema or inflammation. Duodenum is normally positioned as is the ligament of Treitz. No small bowel wall thickening. No small bowel dilatation. The terminal ileum is normal. The appendix is normal. No gross colonic mass. No colonic wall thickening. Vascular/Lymphatic: No abdominal aortic aneurysm. No abdominal aortic atherosclerotic calcification. There is no gastrohepatic or hepatoduodenal ligament lymphadenopathy. No retroperitoneal or mesenteric lymphadenopathy. No pelvic sidewall lymphadenopathy. Reproductive: The uterus is unremarkable.  There is no adnexal mass. Other: No intraperitoneal free fluid. Musculoskeletal: No worrisome lytic or sclerotic osseous abnormality. IMPRESSION: 1. Gallbladder is nondistended with ill definition of the gallbladder wall and potential mild  gallbladder wall thickening. No gallstones by CT. Right upper quadrant ultrasound may prove helpful to further evaluate as clinically warranted. 2. Otherwise unremarkable exam. Electronically Signed   By: Misty Stanley M.D.   On: 07/08/2020 05:55    Procedures Procedures (including critical care time)  Medications Ordered in ED Medications  alum & mag hydroxide-simeth (MAALOX/MYLANTA) 200-200-20 MG/5ML suspension 30 mL (30 mLs Oral Given 07/08/20 0207)  sodium chloride 0.9 % bolus 1,000 mL (1,000 mLs Intravenous New Bag/Given 07/08/20 0411)  morphine 4 MG/ML injection 4 mg (4 mg Intravenous Given 07/08/20 0409)  ondansetron (ZOFRAN) injection 4 mg (4 mg Intravenous Given 07/08/20 0409)  iohexol (OMNIPAQUE) 300 MG/ML solution 100 mL (100 mLs Intravenous Contrast Given 07/08/20 0518)    ED Course  I have reviewed the triage vital signs and the nursing notes.  Pertinent labs & imaging results that were available during my care of the patient were reviewed by me and considered in my medical decision making (see chart for details).  Clinical Course as of Jul 09 607  Tue Jul 08, 2020  0607 Patient is resting comfortably.  Discussed CT findings with the patient.  She has some gallbladder thickening and inflammation.  No noted gallstones.  She will need a follow-up ultrasound.  Given labs and high suspicion for gallstone pancreatitis, will admit for further work-up and pain management.  Currently she is comfortable.  She is n.p.o.  She may need gastroenterology versus general surgery evaluation.  We will plan for admission to the hospitalist.   [CH]    Clinical Course User Index [CH] Mathilde Mcwherter, Barbette Hair, MD   MDM Rules/Calculators/A&P                           Patient presents with abdominal, chest, back pain.  History of significant reflux.  She is overall nontoxic-appearing and vital signs are reassuring.  She is afebrile.  She is tender on exam without signs of peritonitis.  Considerations  include but not limited to, reflux, gastritis, pancreatitis, cholecystitis.  I have reviewed the patient's chart.  She has had a negative right upper quadrant ultrasound and HIDA scan in the recent past.  Patient was given pain and nausea medication.  We will initiated.  Work-up notable for mildly elevated LFTs with AST of 234, ALT of 330, alk phos of  187, total bili 2.6.  No leukocytosis.  Lipase is 301.  Feel her lab testing is highly suggestive of gallstone pancreatitis.  Do not have access to ultrasound at this time.  Will obtain CT imaging to see if we can further delineate.  She is n.p.o.  See clinical course above.  CT imaging shows some potential gallbladder inflammation but is inconclusive.  Will obtain right upper quadrant ultrasound monitoring the lab abnormalities, will admit for observation for pain control and further work-up.  Final Clinical Impression(s) / ED Diagnoses Final diagnoses:  RUQ pain  Acute biliary pancreatitis, unspecified complication status    Rx / DC Orders ED Discharge Orders    None       Merryl Hacker, MD 07/08/20 [phone]

## 2020-07-08 NOTE — ED Triage Notes (Signed)
Pt reports mid back pain that radiates to her chest x 4 days. Denies SHOB.

## 2020-07-08 NOTE — Telephone Encounter (Signed)
Left message on machine to call back  

## 2020-07-08 NOTE — Progress Notes (Signed)
Pt took approx 50% of her soft diet at suppertime, then began to complain of nausea. Zofran administered per order. Pt also states back pain is back since eating, administered pain med per order.

## 2020-07-08 NOTE — H&P (Signed)
History and Physical  Annette Gould  Annette Gould WUJ:[id number] DOB: [birth year] DOA: 07/08/2020  PCP: [name], MD  Patient coming from: Home   I have personally briefly reviewed patient's old medical records in [facility]  Chief Complaint: severe abdominal pain   HPI: Annette Gould is a 19 y.o. female with medical history significant chronic intermittent abdominal pain, nausea and vomiting that she has been experiencing for the past 6 months or longer.  She has lost a significant amount of weight.  She has bouts of severe abdominal pain nausea and vomiting associated with meals.  She has been followed closely by her GI team on the outpatient basis.  They have been working with her.  She was told that she could have celiac disease and she has been working on her diet to avoid gluten.  She had a negative HIDA scan done in April of this year.  She has had an upper endoscopy and there were plans for her to have an EGD later this month in [place].  She reports that her pain usually starts after meals.  She says that 4 days ago she started having severe epigastric abdominal pain that radiated into the back.  She tried to come to the emergency department but the wait was more than 10 hours and so she went back home and managed with being n.p.o.  She reports that for the past 2 days she has not eaten or drink anything.  She has some nausea but no vomiting.  She has no fever.  She ate a banana and experienced severe recurrent abdominal pain that radiated into the back and she presented to the emergency department today.  Her labs reveal transaminitis and an elevated lipase of 300.  She was in severe pain.  She had a hypokalemia with a potassium of 3.4.  She was also sent for CT scan of the abdomen was mostly unremarkable.  Abdominal ultrasound was ordered.  IV pain medication and nausea medications ordered and IV fluids ordered.  The patient is being admitted for acute pancreatitis  suspected to be a gallstone pancreatitis.  Review of Systems: As per HPI otherwise 10 point review of systems negative.   Past Medical History:  Diagnosis Date  . Bursitis    hips  . GERD (gastroesophageal reflux disease)   . Migraine headache    approx 2/month    Past Surgical History:  Procedure Laterality Date  . BIOPSY N/A 04/15/2020   Procedure: BIOPSY;  Surgeon: Pasty Spillers, MD;  Location: [facility];  Service: Endoscopy;  Laterality: N/A;  . DENTAL SURGERY    . ESOPHAGOGASTRODUODENOSCOPY (EGD) WITH PROPOFOL N/A 04/15/2020   Procedure: ESOPHAGOGASTRODUODENOSCOPY (EGD) WITH PROPOFOL;  Surgeon: Pasty Spillers, MD;  Location: [facility];  Service: Endoscopy;  Laterality: N/A;  . TYMPANOSTOMY TUBE PLACEMENT       reports that she has never smoked. She has never used smokeless tobacco. She reports previous alcohol use. She reports previous drug use. Drug: Marijuana.  Allergies  Allergen Reactions  . Peanut-Containing Drug Products Hives and Swelling    Peanuts    Family History  Problem Relation Age of Onset  . Gastric cancer Maternal Grandfather        "not sure where, but somewhere in the stomach area"  . Colon cancer Neg Hx     Prior to Admission medications   Medication Sig Start Date End Date Taking? Authorizing Provider  AUROVELA FE 1.5/30 1.5-30 MG-MCG  tablet Take 1 tablet by mouth daily. 11/12/19   [provider]  ondansetron (ZOFRAN ODT) 4 MG disintegrating tablet Take 1 tablet (4 mg total) by mouth every 8 (eight) hours as needed. 05/21/20   Vanetta Mulders, MD  pantoprazole (PROTONIX) 40 MG tablet Take 1 tablet (40 mg total) by mouth 2 (two) times daily before a meal. 01/01/20   Anice Paganini, NP  propranolol (INDERAL) 10 MG tablet Take 10 mg by mouth daily.  11/12/19   [provider]  sertraline (ZOLOFT) 50 MG tablet Take 75 mg by mouth daily. 11/12/19   [provider]  famotidine (PEPCID) 20 MG tablet Take  1 tablet (20 mg total) by mouth 2 (two) times daily. 12/28/19 01/01/20  [name], PA-C   Physical Exam: Vitals:   07/08/20 0500 07/08/20 0530 07/08/20 0600 07/08/20 0636  BP:    129/87  Pulse:  83  63  Resp: 20 13 19 17   Temp:      TempSrc:      SpO2:  99%  99%  Weight:      Height:       Constitutional: NAD, calm, comfortable Eyes: PERRL, lids and conjunctivae normal ENMT: Mucous membranes are moist. Posterior pharynx clear of any exudate or lesions.Normal dentition.  Neck: normal, supple, no masses, no thyromegaly Respiratory: clear to auscultation bilaterally, no wheezing, no crackles. Normal respiratory effort. No accessory muscle use.  Cardiovascular: Regular rate and rhythm, no murmurs / rubs / gallops. No extremity edema. 2+ pedal pulses. No carotid bruits.  Abdomen: RUQ tenderness with Murphy sign. Bowel sounds positive.  Diffuse epigastric tenderness with light palpation.  Musculoskeletal: no clubbing / cyanosis. No joint deformity upper and lower extremities. Good ROM, no contractures. Normal muscle tone.  Skin: no rashes, lesions, ulcers. No induration Neurologic: CN 2-12 grossly intact. Sensation intact, DTR normal. Strength 5/5 in all 4.  Psychiatric: Normal judgment and insight. Alert and oriented x 3. Normal mood.   Labs on Admission: I have personally reviewed following labs and imaging studies  CBC: Recent Labs  Lab 07/08/20 0240  WBC 9.2  NEUTROABS 5.8  HGB 13.7  HCT 39.8  MCV 78.7*  PLT 316   Basic Metabolic Panel: Recent Labs  Lab 07/08/20 0240  NA 136  K 3.4*  CL 104  CO2 23  GLUCOSE 98  BUN 5*  CREATININE 0.58  CALCIUM 8.9   GFR: Estimated Creatinine Clearance: 98.3 mL/min (by C-G formula based on SCr of 0.58 mg/dL). Liver Function Tests: Recent Labs  Lab 07/08/20 0240  AST 234*  ALT 330*  ALKPHOS 187*  BILITOT 2.6*  PROT 6.8  ALBUMIN 4.0   Recent Labs  Lab 07/08/20 0240  LIPASE 301*   No results for input(s): AMMONIA in the  last 168 hours. Coagulation Profile: No results for input(s): INR, PROTIME in the last 168 hours. Cardiac Enzymes: No results for input(s): CKTOTAL, CKMB, CKMBINDEX, TROPONINI in the last 168 hours. BNP (last 3 results) No results for input(s): PROBNP in the last 8760 hours. HbA1C: No results for input(s): HGBA1C in the last 72 hours. CBG: No results for input(s): GLUCAP in the last 168 hours. Lipid Profile: No results for input(s): CHOL, HDL, LDLCALC, TRIG, CHOLHDL, LDLDIRECT in the last 72 hours. Thyroid Function Tests: No results for input(s): TSH, T4TOTAL, FREET4, T3FREE, THYROIDAB in the last 72 hours. Anemia Panel: No results for input(s): VITAMINB12, FOLATE, FERRITIN, TIBC, IRON, RETICCTPCT in the last 72 hours. Urine analysis:  Component Value Date/Time   COLORURINE YELLOW 12/28/2019 0850   APPEARANCEUR HAZY (A) 12/28/2019 0850   LABSPEC 1.018 12/28/2019 0850   PHURINE 6.0 12/28/2019 0850   GLUCOSEU NEGATIVE 12/28/2019 0850   HGBUR NEGATIVE 12/28/2019 0850   BILIRUBINUR NEGATIVE 12/28/2019 0850   KETONESUR 20 (A) 12/28/2019 0850   PROTEINUR NEGATIVE 12/28/2019 0850   NITRITE NEGATIVE 12/28/2019 0850   LEUKOCYTESUR NEGATIVE 12/28/2019 0850    Radiological Exams on Admission: CT ABDOMEN PELVIS W CONTRAST  Result Date: 07/08/2020 CLINICAL DATA:  Epigastric pain. EXAM: CT ABDOMEN AND PELVIS WITH CONTRAST TECHNIQUE: Multidetector CT imaging of the abdomen and pelvis was performed using the standard protocol following bolus administration of intravenous contrast. CONTRAST:  OMNIPAQUE IOHEXOL 300 MG/ML  SOLN COMPARISON:  None. FINDINGS: Lower chest: Unremarkable. Hepatobiliary: No suspicious focal abnormality within the liver parenchyma. Gallbladder is nondistended with ill definition of the gallbladder wall and potential mild gallbladder wall thickening. No intrahepatic or extrahepatic biliary dilation. Pancreas: No focal mass lesion. No dilatation of the main duct. No  intraparenchymal cyst. No peripancreatic edema. Spleen: No splenomegaly. No focal mass lesion. Adrenals/Urinary Tract: No adrenal nodule or mass. Kidneys unremarkable. No evidence for hydroureter. The urinary bladder appears normal for the degree of distention. Stomach/Bowel: Stomach is unremarkable. No gastric wall thickening. No evidence of outlet obstruction. No perigastric edema or inflammation. Duodenum is normally positioned as is the ligament of Treitz. No small bowel wall thickening. No small bowel dilatation. The terminal ileum is normal. The appendix is normal. No gross colonic mass. No colonic wall thickening. Vascular/Lymphatic: No abdominal aortic aneurysm. No abdominal aortic atherosclerotic calcification. There is no gastrohepatic or hepatoduodenal ligament lymphadenopathy. No retroperitoneal or mesenteric lymphadenopathy. No pelvic sidewall lymphadenopathy. Reproductive: The uterus is unremarkable.  There is no adnexal mass. Other: No intraperitoneal free fluid. Musculoskeletal: No worrisome lytic or sclerotic osseous abnormality. IMPRESSION: 1. Gallbladder is nondistended with ill definition of the gallbladder wall and potential mild gallbladder wall thickening. No gallstones by CT. Right upper quadrant ultrasound may prove helpful to further evaluate as clinically warranted. 2. Otherwise unremarkable exam. Electronically Signed   By: Kennith Gould M.D.   On: 07/08/2020 05:55   Assessment/Plan Active Problems:   Generalized abdominal pain   Nausea with vomiting   GERD (gastroesophageal reflux disease)   Acute gallstone pancreatitis   Transaminitis   Hypokalemia   Hyperbilirubinemia    1. Acute gallstone pancreatitis-I suspect she may have underlying gallstones and she is sent for a right upper quadrant abdominal ultrasound for further evaluation.  I have also requested a GI consultation and general surgery consultation in case she will require an ERCP.  Maintain n.p.o. status.  IV fluid  hydration ordered.  IV pain and nausea medications ordered.  Follow lipase levels.  Follow CMP. 2. Hypokalemia-IV replacement ordered. 3. Nausea and vomiting and abdominal pain-IV medications ordered as noted above. 4. Transaminitis - secondary to gallstone pancreatitis - Supportive care and follow LFTs.   5. GERD-IV Protonix ordered for GI protection. 6. Hyperbilirubinemia-treating supportively and will follow CMP.  DVT prophylaxis: Lovenox Code Status: Full Family Communication: Updated at bedside and questions answered, verbalizes understanding Disposition Plan: Home Consults called: GI and surgery Admission status: INP  Quitman Norberto MD [facility] How to contact the [facility] Attending or Consulting provider 7A - 7P or covering provider during after hours 7P -7A, for this patient?  1. Check the care team in [facility] and look for a) attending/consulting [facility] provider listed and b) the [facility]  team listed 2. Log into [email or web address] and use [place]'s universal password to access. If you do not have the password, please contact the hospital operator. 3. Locate the [facility] provider you are looking for under [facility] and page to a number that you can be directly reached. 4. If you still have difficulty reaching the provider, please page the [facility] (Director on Call) for the Hospitalists listed on amion for assistance.   If 7PM-7AM, please contact night-coverage [email or web address] Password [email or web address]  07/08/2020, 7:14 AM

## 2020-07-08 NOTE — Consult Note (Signed)
Reason for Consult: Acute pancreatitis, elevated liver enzyme tests Referring Physician: Dr. Ola SpurrJohnson  Annette Gould is an 19 y.o. female.  HPI: Patient is a 19 year old white female who presents with worsening epigastric and right upper quadrant abdominal pain which radiates to her back.  She has been seen a gastroenterologist at [facility] for some time now due to recurrent nausea, decreased appetite, and abdominal pain.  Her work-up there has been unremarkable.  Ultrasound the gallbladder was negative.  HIDA scan was within normal limits.  She has adjusted her diet, but she has not improved.  In the emergency room, she was noted to have elevated liver enzyme tests and elevated lipase.  Her pain has somewhat eased secondary to pain medication.  Past Medical History:  Diagnosis Date   Bursitis    hips   GERD (gastroesophageal reflux disease)    Migraine headache    approx 2/month    Past Surgical History:  Procedure Laterality Date   BIOPSY N/A 04/15/2020   Procedure: BIOPSY;  Surgeon: Pasty Spillersahiliani, Varnita B, MD;  [facility];  Service: Endoscopy;  Laterality: N/A;   DENTAL SURGERY     ESOPHAGOGASTRODUODENOSCOPY (EGD) WITH PROPOFOL N/A 04/15/2020   Procedure: ESOPHAGOGASTRODUODENOSCOPY (EGD) WITH PROPOFOL;  Surgeon: Pasty Spillersahiliani, Varnita B, MD;  [facility];  Service: Endoscopy;  Laterality: N/A;   TYMPANOSTOMY TUBE PLACEMENT      Family History  Problem Relation Age of Onset   Gastric cancer Maternal Grandfather        "not sure where, but somewhere in the stomach area"   Colon cancer Neg Hx     Social History:  reports that she has never smoked. She has never used smokeless tobacco. She reports previous alcohol use. She reports previous drug use. Drug: Marijuana.  Allergies:  Allergies  Allergen Reactions   Peanut-Containing Drug Products Hives and Swelling    Peanuts    Medications: I have reviewed the patient's current  medications.  Results for orders placed or performed during the hospital encounter of 07/08/20 (from the past 48 hour(s))  CBC with Differential     Status: Abnormal   Collection Time: 07/08/20  2:40 AM  Result Value Ref Range   WBC 9.2 4.0 - 10.5 K/uL   RBC 5.06 3.87 - 5.11 MIL/uL   Hemoglobin 13.7 12.0 - 15.0 g/dL   HCT 40.939.8 36 - 46 %   MCV 78.7 (L) 80.0 - 100.0 fL   MCH 27.1 26.0 - 34.0 pg   MCHC 34.4 30.0 - 36.0 g/dL   RDW 81.112.5 91.411.5 - 78.215.5 %   Platelets 316 150 - 400 K/uL   nRBC 0.0 0.0 - 0.2 %   Neutrophils Relative % 63 %   Neutro Abs 5.8 1.7 - 7.7 K/uL   Lymphocytes Relative 26 %   Lymphs Abs 2.4 0.7 - 4.0 K/uL   Monocytes Relative 10 %   Monocytes Absolute 0.9 0 - 1 K/uL   Eosinophils Relative 1 %   Eosinophils Absolute 0.1 0 - 0 K/uL   Basophils Relative 0 %   Basophils Absolute 0.0 0 - 0 K/uL   Immature Granulocytes 0 %   Abs Immature Granulocytes 0.02 0.00 - 0.07 K/uL    Comment: Performed at [facility], [address] [place], [place] [address]  Comprehensive metabolic panel     Status: Abnormal   Collection Time: 07/08/20  2:40 AM  Result Value Ref Range   Sodium 136 135 - 145 mmol/L  Potassium 3.4 (L) 3.5 - 5.1 mmol/L   Chloride 104 98 - 111 mmol/L   CO2 23 22 - 32 mmol/L   Glucose, Bld 98 70 - 99 mg/dL    Comment: Glucose reference range applies only to samples taken after fasting for at least 8 hours.   BUN 5 (L) 6 - 20 mg/dL   Creatinine, Ser 8.29 0.44 - 1.00 mg/dL   Calcium 8.9 8.9 - 56.2 mg/dL   Total Protein 6.8 6.5 - 8.1 g/dL   Albumin 4.0 3.5 - 5.0 g/dL   AST 130 (H) 15 - 41 U/L   ALT 330 (H) 0 - 44 U/L   Alkaline Phosphatase 187 (H) 38 - 126 U/L   Total Bilirubin 2.6 (H) 0.3 - 1.2 mg/dL   GFR calc non Af Amer >60 >60 mL/min   GFR calc Af Amer >60 >60 mL/min   Anion gap 9 5 - 15    Comment: Performed at [facility], [address], [place], [place] [address]  Lipase, blood     Status: Abnormal   Collection Time: 07/08/20  2:40 AM  Result  Value Ref Range   Lipase 301 (H) 11 - 51 U/L    Comment: Performed at [facility], [address] [place], [place] [address]  hCG, quantitative, pregnancy     Status: None   Collection Time: 07/08/20  2:40 AM  Result Value Ref Range   hCG, Beta Chain, Quant, S <1 <5 mIU/mL    Comment:          GEST. AGE      CONC.  (mIU/mL)   <=1 WEEK        5 - 50     2 WEEKS       50 - 500     3 WEEKS       100 - 10,000     4 WEEKS     1,000 - 30,000     5 WEEKS     3,500 - 115,000   6-8 WEEKS     12,000 - 270,000    12 WEEKS     15,000 - 220,000        FEMALE AND NON-PREGNANT FEMALE:     LESS THAN 5 mIU/mL Performed at [facility], [address] [place], [place] [address]   Magnesium     Status: None   Collection Time: 07/08/20  2:40 AM  Result Value Ref Range   Magnesium 2.2 1.7 - 2.4 mg/dL    Comment: Performed at [facility], [address] [place], [place] [address]  Lipid panel     Status: None   Collection Time: 07/08/20  2:41 AM  Result Value Ref Range   Cholesterol 176 0 - 200 mg/dL   Triglycerides 63 <010 mg/dL   HDL 80 >27 mg/dL   Total CHOL/HDL Ratio 2.2 RATIO   VLDL 13 0 - 40 mg/dL   LDL Cholesterol 83 0 - 99 mg/dL    Comment:        Total Cholesterol/HDL:CHD Risk Coronary Heart Disease Risk Table                     Men   Women  1/2 Average Risk   3.4   3.3  Average Risk       5.0   4.4  2 X Average Risk   9.6   7.1  3 X Average Risk  23.4   11.0  Use the calculated Patient Ratio above and the CHD Risk Table to determine the patient's CHD Risk.        ATP III CLASSIFICATION (LDL):  <100     mg/dL   Optimal  409-811  mg/dL   Near or Above                    Optimal  130-159  mg/dL   Borderline  914-782  mg/dL   High  >956     mg/dL   Very High Performed at [facility], [address], [place], [place] [address]   Respiratory Panel by RT PCR (Flu A&B, Covid) - Nasopharyngeal Swab     Status: None   Collection Time: 07/08/20  4:15 AM   Specimen:  Nasopharyngeal Swab  Result Value Ref Range   SARS Coronavirus 2 by RT PCR NEGATIVE NEGATIVE    Comment: (NOTE) SARS-CoV-2 target nucleic acids are NOT DETECTED.  The SARS-CoV-2 RNA is generally detectable in upper respiratoy specimens during the acute phase of infection. The lowest concentration of SARS-CoV-2 viral copies this assay can detect is 131 copies/mL. A negative result does not preclude SARS-Cov-2 infection and should not be used as the sole basis for treatment or other patient management decisions. A negative result may occur with  improper specimen collection/handling, submission of specimen other than nasopharyngeal swab, presence of viral mutation(s) within the areas targeted by this assay, and inadequate number of viral copies (<131 copies/mL). A negative result must be combined with clinical observations, patient history, and epidemiological information. The expected result is Negative.  Fact Sheet for Patients:  https://www.moore.com/  Fact Sheet for Healthcare Providers:  https://www.young.biz/  This test is no t yet approved or cleared by the [place] FDA and  has been authorized for detection and/or diagnosis of SARS-CoV-2 by FDA under an Emergency Use Authorization (EUA). This EUA will remain  in effect (meaning this test can be used) for the duration of the COVID-19 declaration under Section 564(b)(1) of the Act, 21 U.S.C. section 360bbb-3(b)(1), unless the authorization is terminated or revoked sooner.     Influenza A by PCR NEGATIVE NEGATIVE   Influenza B by PCR NEGATIVE NEGATIVE    Comment: (NOTE) The Xpert Xpress SARS-CoV-2/FLU/RSV assay is intended as an aid in  the diagnosis of influenza from Nasopharyngeal swab specimens and  should not be used as a sole basis for treatment. Nasal washings and  aspirates are unacceptable for Xpert Xpress SARS-CoV-2/FLU/RSV  testing.  Fact Sheet for  Patients: https://www.moore.com/  Fact Sheet for Healthcare Providers: https://www.young.biz/  This test is not yet approved or cleared by the [place] FDA and  has been authorized for detection and/or diagnosis of SARS-CoV-2 by  FDA under an Emergency Use Authorization (EUA). This EUA will remain  in effect (meaning this test can be used) for the duration of the  Covid-19 declaration under Section 564(b)(1) of the Act, 21  U.S.C. section 360bbb-3(b)(1), unless the authorization is  terminated or revoked. Performed at [facility], [address] [place], [place] [address]     CT ABDOMEN PELVIS W CONTRAST  Result Date: 07/08/2020 CLINICAL DATA:  Epigastric pain. EXAM: CT ABDOMEN AND PELVIS WITH CONTRAST TECHNIQUE: Multidetector CT imaging of the abdomen and pelvis was performed using the standard protocol following bolus administration of intravenous contrast. CONTRAST:  OMNIPAQUE IOHEXOL 300 MG/ML  SOLN COMPARISON:  None. FINDINGS: Lower chest: Unremarkable. Hepatobiliary: No suspicious focal abnormality within the liver parenchyma. Gallbladder is  nondistended with ill definition of the gallbladder wall and potential mild gallbladder wall thickening. No intrahepatic or extrahepatic biliary dilation. Pancreas: No focal mass lesion. No dilatation of the main duct. No intraparenchymal cyst. No peripancreatic edema. Spleen: No splenomegaly. No focal mass lesion. Adrenals/Urinary Tract: No adrenal nodule or mass. Kidneys unremarkable. No evidence for hydroureter. The urinary bladder appears normal for the degree of distention. Stomach/Bowel: Stomach is unremarkable. No gastric wall thickening. No evidence of outlet obstruction. No perigastric edema or inflammation. Duodenum is normally positioned as is the ligament of Treitz. No small bowel wall thickening. No small bowel dilatation. The terminal ileum is normal. The appendix is normal. No gross colonic  mass. No colonic wall thickening. Vascular/Lymphatic: No abdominal aortic aneurysm. No abdominal aortic atherosclerotic calcification. There is no gastrohepatic or hepatoduodenal ligament lymphadenopathy. No retroperitoneal or mesenteric lymphadenopathy. No pelvic sidewall lymphadenopathy. Reproductive: The uterus is unremarkable.  There is no adnexal mass. Other: No intraperitoneal free fluid. Musculoskeletal: No worrisome lytic or sclerotic osseous abnormality. IMPRESSION: 1. Gallbladder is nondistended with ill definition of the gallbladder wall and potential mild gallbladder wall thickening. No gallstones by CT. Right upper quadrant ultrasound may prove helpful to further evaluate as clinically warranted. 2. Otherwise unremarkable exam. Electronically Signed   By: Kennith Center M.D.   On: 07/08/2020 05:55   US Abdomen Limited RUQ  Result Date: 07/08/2020 CLINICAL DATA:  Right upper quadrant pain EXAM: ULTRASOUND ABDOMEN LIMITED RIGHT UPPER QUADRANT COMPARISON:  CT abdomen and pelvis July 08, 2020; abdominal ultrasound December 28, 2019 FINDINGS: Gallbladder: Within the gallbladder, there are echogenic foci which move and shadow consistent with cholelithiasis. Gallbladder wall is borderline thickened without edema. No pericholecystic fluid. No sonographic Murphy sign noted by sonographer. Common bile duct: Diameter: 3 mm. No intrahepatic or extrahepatic biliary duct dilatation. Liver: No focal lesion identified. Within normal limits in parenchymal echogenicity. Portal vein is patent on color Doppler imaging with normal direction of blood flow towards the liver. Other: None. IMPRESSION: Cholelithiasis with borderline gallbladder wall thickening. These findings could be indicative of early acute cholecystitis. In this regard, it may be prudent to correlate with nuclear medicine hepatobiliary imaging study to assess for cystic duct patency. Study otherwise unremarkable. Electronically Signed   By: Bretta Bang III M.D.   On: 07/08/2020 08:15    ROS:  Pertinent items are noted in HPI.  Blood pressure 121/60, pulse (!) 56, temperature 98.7 F (37.1 C), temperature source Oral, resp. rate 18, height 5\' 2"  (1.575 m), weight 63.5 kg, last menstrual period 06/24/2020, SpO2 99 %. Physical Exam: Pleasant well-developed well-nourished white female no acute distress Head is normocephalic, atraumatic Eyes are without scleral icterus Lungs are clear to auscultation with your breath sounds bilaterally Heart examination reveals a regular rate and rhythm without S3, S4, or murmurs Abdomen is soft with nonspecific tenderness in the epigastric and right upper quadrant regions.  No rigidity is noted.  CT scan of the abdomen report reviewed  Ultrasound of the gallbladder reveals multiple small stones with a couple lodged in the neck.  Common bile duct is 3 mm  Assessment/Plan: Impression: Gallstone pancreatitis Plan: Patient will be admitted by the hospitalist.  Will follow her liver enzyme tests and lipase.  Once those are improving, will proceed with laparoscopic cholecystectomy during this admission.  06/26/2020 07/08/2020, 9:20 AM

## 2020-07-08 NOTE — Consult Note (Signed)
Referring Provider: [facility] hospitalist Primary Care Physician:  Cari Caraway, MD Primary Gastroenterologist:  Dr. Bonna Gains ([place])  Date of Admission: 07/08/20 Date of Consultation: 07/08/20  Reason for Consultation:  Gallstone pancreatitis  HPI:  Annette Gould is a 19 y.o. year old female with history of GERD who presented to the emergency room 10/5 for abdominal pain, chest pain, and back pain that has been waxing and waning over the last 4 days.  Initial work-up remarkable for potassium 3.4, AST 234, ALT 330, alk phos 187, total bilirubin 2.6, lipase 301.  CT A/P with contrast: Gallbladder is nondistended with ill-definition of the collateral differential mild gallbladder wall thickening, no gallstones by CT.  No intrahepatic or extrahepatic biliary dilation.  No pancreatic edema or other pancreatic abnormality.  RUQ ultrasound: Cholelithiasis with borderline gallbladder wall thickening.  CBD within normal limits.  States these findings could be indicative of early acute cholecystitis.  May need to correlate with HIDA scan to assess for cystic duct patency.  She has been made NPO, started on normal saline 150 mL/h, and Protonix 40 mg IV daily.  Hospitalist has been consulted for admission.  GI and general surgery have also been consulted.  Today:  Nausea and vomiting started in Feb/March. Associated upper abdominal pain. This was present most days. These symptoms improved with Protonix and she was able to get back to work.  Started working at [employer].  Developed back pain, epigastric pain, chest pain about 4 days ago. No return of nausea or vomiting.  GERD remains well controlled on Protonix twice daily.  She fasted for 2-3 days. Tried coming to the ED but left due to 10 hour wait. Felt pain had completely resolved yesterday. She ate a banana for breakfast. About 30 minutes later, she had return of abdominal pain. Not sure if she had a fever at home. States she  couldn't get comfortable. No confusion. No jaundice at home.   Continues with pain , about 6-7/10.   No NSAIDs.  No regular alcohol consumption.  Bowels typically move pretty well.  She had some diarrhea yesterday morning which she thought was related to her abdominal pain.  No bright red blood per rectum or melena.  EGD 04/15/2020: Extrinsic compression of proximal esophagus, normal stomach biopsy, normal duodenal bulb, mucosal changes in duodenum biopsied.  Pathology was benign.   Past Medical History:  Diagnosis Date  . Bursitis    hips  . GERD (gastroesophageal reflux disease)   . Migraine headache    approx 2/month    Past Surgical History:  Procedure Laterality Date  . BIOPSY N/A 04/15/2020   Procedure: BIOPSY;  Surgeon: Virgel Manifold, MD;  Location: [place];  Service: Endoscopy;  Laterality: N/A;  . DENTAL SURGERY    . ESOPHAGOGASTRODUODENOSCOPY (EGD) WITH PROPOFOL N/A 04/15/2020   Procedure: ESOPHAGOGASTRODUODENOSCOPY (EGD) WITH PROPOFOL;  Surgeon: Virgel Manifold, MD;  Location: [place];  Service: Endoscopy;  Laterality: N/A;  . TYMPANOSTOMY TUBE PLACEMENT      Prior to Admission medications   Medication Sig Start Date End Date Taking? Authorizing Provider  AUROVELA FE 1.5/30 1.5-30 MG-MCG tablet Take 1 tablet by mouth daily. 11/12/19  Yes [provider]  ondansetron (ZOFRAN ODT) 4 MG disintegrating tablet Take 1 tablet (4 mg total) by mouth every 8 (eight) hours as needed. 05/21/20  Yes Fredia Sorrow, MD  pantoprazole (PROTONIX) 40 MG tablet Take 1 tablet (40 mg total) by mouth 2 (two) times daily  before a meal. 01/01/20  Yes Carlis Stable, NP  propranolol (INDERAL) 10 MG tablet Take 10 mg by mouth daily as needed (If blood pressure rises).  11/12/19  Yes [provider]  sertraline (ZOLOFT) 50 MG tablet Take 75 mg by mouth daily. 11/12/19  Yes [provider]  famotidine (PEPCID) 20 MG tablet Take 1 tablet (20 mg  total) by mouth 2 (two) times daily. 12/28/19 01/01/20  Evalee Jefferson, PA-C    Current Facility-Administered Medications  Medication Dose Route Frequency Provider Last Rate Last Admin  . 0.9 %  sodium chloride infusion   Intravenous Continuous Wynetta Emery, Clanford L, MD 150 mL/hr at 07/08/20 0758 New Bag at 07/08/20 0758  . acetaminophen (TYLENOL) tablet 650 mg  650 mg Oral Q6H PRN Johnson, Clanford L, MD       Or  . acetaminophen (TYLENOL) suppository 650 mg  650 mg Rectal Q6H PRN Johnson, Clanford L, MD      . enoxaparin (LOVENOX) injection 40 mg  40 mg Subcutaneous Q24H Johnson, Clanford L, MD      . HYDROmorphone (DILAUDID) injection 0.25-0.5 mg  0.25-0.5 mg Intravenous Q3H PRN Johnson, Clanford L, MD   0.25 mg at 07/08/20 0949  . ondansetron (ZOFRAN) tablet 4 mg  4 mg Oral Q6H PRN Johnson, Clanford L, MD       Or  . ondansetron (ZOFRAN) injection 4 mg  4 mg Intravenous Q6H PRN Johnson, Clanford L, MD      . pantoprazole (PROTONIX) injection 40 mg  40 mg Intravenous Q24H Johnson, Clanford L, MD   40 mg at 07/08/20 0939  . potassium chloride 10 mEq in 100 mL IVPB  10 mEq Intravenous Q1 Hr x 4 Johnson, Clanford L, MD 100 mL/hr at 07/08/20 0953 10 mEq at 07/08/20 0953    Allergies as of 07/08/2020 - Review Complete 07/08/2020  Allergen Reaction Noted  . Peanut-containing drug products Hives and Swelling 04/10/2020    Family History  Problem Relation Age of Onset  . Gastric cancer Maternal Grandfather        "not sure where, but somewhere in the stomach area"  . Colon cancer Neg Hx     Social History   Socioeconomic History  . Marital status: Single    Spouse name: Not on file  . Number of children: Not on file  . Years of education: Not on file  . Highest education level: Not on file  Occupational History  . Not on file  Tobacco Use  . Smoking status: Never Smoker  . Smokeless tobacco: Never Used  . Tobacco comment: Smoked less than 1 pack per week for about 3 months  Vaping Use   . Vaping Use: Never used  Substance and Sexual Activity  . Alcohol use: Not Currently    Comment: may have 1-2 drinks/month. None currently.   . Drug use: Not Currently    Types: Marijuana    Comment: Last used last week (07/08/20)- maybe 1-2 times a week.   Marland Kitchen Sexual activity: Yes  Other Topics Concern  . Not on file  Social History Narrative  . Not on file   Social Determinants of Health   Financial Resource Strain:   . Difficulty of Paying Living Expenses: Not on file  Food Insecurity:   . Worried About Charity fundraiser in the Last Year: Not on file  . Ran Out of Food in the Last Year: Not on file  Transportation Needs:   . Lack of  Transportation (Medical): Not on file  . Lack of Transportation (Non-Medical): Not on file  Physical Activity:   . Days of Exercise per Week: Not on file  . Minutes of Exercise per Session: Not on file  Stress:   . Feeling of Stress : Not on file  Social Connections:   . Frequency of Communication with Friends and Family: Not on file  . Frequency of Social Gatherings with Friends and Family: Not on file  . Attends Religious Services: Not on file  . Active Member of Clubs or Organizations: Not on file  . Attends Archivist Meetings: Not on file  . Marital Status: Not on file  Intimate Partner Violence:   . Fear of Current or Ex-Partner: Not on file  . Emotionally Abused: Not on file  . Physically Abused: Not on file  . Sexually Abused: Not on file    Review of Systems: Gen: Denies fever, chills.  Admits to mild dizziness.  CV: Denies heart palpitations Resp: Denies shortness of breath or cough. GI: See HPI GU : Denies urinary burning, urinary frequency, urinary incontinence.  MS: Denies joint pain Heme: See HPI  Physical Exam: Vital signs in last 24 hours: Temp:  [98.7 F (37.1 C)-99.8 F (37.7 C)] 98.7 F (37.1 C) (10/05 0847) Pulse Rate:  [56-83] 56 (10/05 0847) Resp:  [11-20] 18 (10/05 0847) BP: (121-158)/(60-91)  121/60 (10/05 0847) SpO2:  [99 %-100 %] 99 % (10/05 0847) Weight:  [63.5 kg] 63.5 kg (10/05 0056)   General:   Alert,  Well-developed, well-nourished, pleasant and cooperative in NAD Head:  Normocephalic and atraumatic. Eyes:  Sclera clear, no icterus.   Conjunctiva pink. Ears:  Normal auditory acuity. Lungs:  Clear throughout to auscultation.   No wheezes, crackles, or rhonchi. No acute distress. Heart:  Regular rate and rhythm; no murmurs, clicks, rubs,  or gallops. Abdomen:  Soft and nondistended.  Mild tenderness to palpation in the epigastric and LUQ region. No masses, hepatosplenomegaly or hernias noted. Normal bowel sounds, without guarding, and without rebound.   Rectal:  Deferred.   Msk:  Symmetrical without gross deformities. Normal posture. Extremities:  Without edema. Neurologic:  Alert and  oriented x4;  grossly normal neurologically. Skin:  Intact without significant lesions or rashes. Psych:  Normal mood and affect.  Intake/Output from previous day: No intake/output data recorded. Intake/Output this shift: No intake/output data recorded.  Lab Results: Recent Labs    07/08/20 0240  WBC 9.2  HGB 13.7  HCT 39.8  PLT 316   BMET Recent Labs    07/08/20 0240  NA 136  K 3.4*  CL 104  CO2 23  GLUCOSE 98  BUN 5*  CREATININE 0.58  CALCIUM 8.9   LFT Recent Labs    07/08/20 0240  PROT 6.8  ALBUMIN 4.0  AST 234*  ALT 330*  ALKPHOS 187*  BILITOT 2.6*   Studies/Results: CT ABDOMEN PELVIS W CONTRAST  Result Date: 07/08/2020 CLINICAL DATA:  Epigastric pain. EXAM: CT ABDOMEN AND PELVIS WITH CONTRAST TECHNIQUE: Multidetector CT imaging of the abdomen and pelvis was performed using the standard protocol following bolus administration of intravenous contrast. CONTRAST:  175m OMNIPAQUE IOHEXOL 300 MG/ML  SOLN COMPARISON:  None. FINDINGS: Lower chest: Unremarkable. Hepatobiliary: No suspicious focal abnormality within the liver parenchyma. Gallbladder is  nondistended with ill definition of the gallbladder wall and potential mild gallbladder wall thickening. No intrahepatic or extrahepatic biliary dilation. Pancreas: No focal mass lesion. No dilatation of the main duct. No  intraparenchymal cyst. No peripancreatic edema. Spleen: No splenomegaly. No focal mass lesion. Adrenals/Urinary Tract: No adrenal nodule or mass. Kidneys unremarkable. No evidence for hydroureter. The urinary bladder appears normal for the degree of distention. Stomach/Bowel: Stomach is unremarkable. No gastric wall thickening. No evidence of outlet obstruction. No perigastric edema or inflammation. Duodenum is normally positioned as is the ligament of Treitz. No small bowel wall thickening. No small bowel dilatation. The terminal ileum is normal. The appendix is normal. No gross colonic mass. No colonic wall thickening. Vascular/Lymphatic: No abdominal aortic aneurysm. No abdominal aortic atherosclerotic calcification. There is no gastrohepatic or hepatoduodenal ligament lymphadenopathy. No retroperitoneal or mesenteric lymphadenopathy. No pelvic sidewall lymphadenopathy. Reproductive: The uterus is unremarkable.  There is no adnexal mass. Other: No intraperitoneal free fluid. Musculoskeletal: No worrisome lytic or sclerotic osseous abnormality. IMPRESSION: 1. Gallbladder is nondistended with ill definition of the gallbladder wall and potential mild gallbladder wall thickening. No gallstones by CT. Right upper quadrant ultrasound may prove helpful to further evaluate as clinically warranted. 2. Otherwise unremarkable exam. Electronically Signed   By: Misty Stanley M.D.   On: 07/08/2020 05:55   US Abdomen Limited RUQ  Result Date: 07/08/2020 CLINICAL DATA:  Right upper quadrant pain EXAM: ULTRASOUND ABDOMEN LIMITED RIGHT UPPER QUADRANT COMPARISON:  CT abdomen and pelvis July 08, 2020; abdominal ultrasound December 28, 2019 FINDINGS: Gallbladder: Within the gallbladder, there are echogenic foci  which move and shadow consistent with cholelithiasis. Gallbladder wall is borderline thickened without edema. No pericholecystic fluid. No sonographic Murphy sign noted by sonographer. Common bile duct: Diameter: 3 mm. No intrahepatic or extrahepatic biliary duct dilatation. Liver: No focal lesion identified. Within normal limits in parenchymal echogenicity. Portal vein is patent on color Doppler imaging with normal direction of blood flow towards the liver. Other: None. IMPRESSION: Cholelithiasis with borderline gallbladder wall thickening. These findings could be indicative of early acute cholecystitis. In this regard, it may be prudent to correlate with nuclear medicine hepatobiliary imaging study to assess for cystic duct patency. Study otherwise unremarkable. Electronically Signed   By: Lowella Grip III M.D.   On: 07/08/2020 08:15    Impression: 19 year old female with history of several months of nausea, vomiting, abdominal pain thought to be secondary to GERD as symptoms had improved with PPI, recent EGD in July with extrinsic compression of proximal esophagus, scheduled for EUS on 10/7, who presented to the emergency room 07/08/2020 due to 4 days of epigastric pain, chest pain, and back pain found to have likely gallstone pancreatitis.  Pancreatitis: Initial labs with lipase 301, AST 234, ALT 330, alk phos 187, total bili 2.6.  WBC within normal limits.  BUN low at 5.  Hematocrit within normal limits at 39.8.  Triglycerides 63.  Pancreas appeared normal on CT imaging.  No biliary ductal dilation on CT or RUQ ultrasound with choledocholithiasis with borderline gallbladder wall thickening.    No leukocytosis, fever, jaundice, or mental status changes to suggest cholangitis. Patient has been made n.p.o., started on IV fluids, and Dilaudid for pain control. She continues with 6-7/10 pain at the time of my consult.   Suspect pancreatitis is secondary to gallstones/choledocholithiasis.  No significant  history of alcohol use. As LFTs and bilirubin are elevated, suspect patient may have either passed a gallstone or may still have microlithiasis.  She will need MRCP to evaluate for possible choledocholithiasis.  General surgery has also been consulted with plans for cholecystectomy once liver enzymes and lipase are improving.  Hypokalemia: Potassium 3.4 on admission.  Correction per hospitalists.   Plan: MRI/MRCP to evaluate biliary tree. Will increase IV fluid rate to 250 mL/hr for now to allow adequate hydration within the first 24 hours. This is still below the general recommendation of 12m/kg/hr; however, her pancreas appears completely normal on CT.  Continue IV PPI daily.  Remain NPO for now.  Electrolyte correction per hospitalists.  Follow LFTs.  Monitor for signs of infection/cholangitis.  Continue supportive measures.  Further recommendations to follow MRCP.    LOS: 0 days    07/08/2020, 10:15 AM   KAliene Altes PAscension Borgess HospitalGastroenterology

## 2020-07-08 NOTE — ED Notes (Signed)
hospitalist and surgeon at bedside. 

## 2020-07-08 NOTE — Plan of Care (Signed)
MRCP performed, no presence of choledocholithiasis was noted.  Clinical picture fits passing gallstone pancreatitis.  Will advance diet as tolerated.  Plan: - Advance diet to GI soft diet tonight - Decrease LR to 150 cc/h - Cholecystectomy timing per general surgery  Katrinka Blazing, MD Gastroenterology and Hepatology [facility]

## 2020-07-08 NOTE — Telephone Encounter (Signed)
-----   Message from Rachael Fee, MD sent at 07/08/2020  3:31 PM EDT ----- Regarding: RE: reschedule EUS 07/10/2020 Got it, thanks.  Yes endoscopic ultrasound in the setting is probably not a very good idea.  We will reach out to reschedule.   Jonah Gingras, Please cancel her endoscopic ultrasound that is on this Thursday the seventh.  Offer her EUS appointment with me in about 1 month from now to allow her some time to recover from her gallstone pancreatitis and I expect she will be having gallbladder surgery as well.  Thanks ----- Message ----- From: Dolores Frame, MD Sent: 07/08/2020   2:45 PM EDT To: Rachael Fee, MD Subject: reschedule EUS 07/10/2020                       Hi Dr. Christella Hartigan,  I hope you are doing well. I want to give you a heads up.  Mrs. Wrench was hospitalized yesterday after an episode of gallstone pancreatitis.  I saw that she is scheduled to undergo an EUS with you in 2 days but may need to reschedule this.  If you have any questions, please let me knowThanks,  Katrinka Blazing, MD Gastroenterology and Hepatology [facility]

## 2020-07-09 DIAGNOSIS — R7401 Elevation of levels of liver transaminase levels: Secondary | ICD-10-CM

## 2020-07-09 DIAGNOSIS — K851 Biliary acute pancreatitis without necrosis or infection: Secondary | ICD-10-CM

## 2020-07-09 LAB — CBC WITH DIFFERENTIAL/PLATELET
Abs Immature Granulocytes: 0.02 10*3/uL (ref 0.00–0.07)
Basophils Absolute: 0 10*3/uL (ref 0.0–0.1)
Basophils Relative: 0 %
Eosinophils Absolute: 0.1 10*3/uL (ref 0.0–0.5)
Eosinophils Relative: 2 %
HCT: 36.5 % (ref 36.0–46.0)
Hemoglobin: 12 g/dL (ref 12.0–15.0)
Immature Granulocytes: 0 %
Lymphocytes Relative: 38 %
Lymphs Abs: 2.2 10*3/uL (ref 0.7–4.0)
MCH: 26.4 pg (ref 26.0–34.0)
MCHC: 32.9 g/dL (ref 30.0–36.0)
MCV: 80.2 fL (ref 80.0–100.0)
Monocytes Absolute: 0.5 10*3/uL (ref 0.1–1.0)
Monocytes Relative: 8 %
Neutro Abs: 2.9 10*3/uL (ref 1.7–7.7)
Neutrophils Relative %: 52 %
Platelets: 245 10*3/uL (ref 150–400)
RBC: 4.55 MIL/uL (ref 3.87–5.11)
RDW: 12.6 % (ref 11.5–15.5)
WBC: 5.7 10*3/uL (ref 4.0–10.5)
nRBC: 0 % (ref 0.0–0.2)

## 2020-07-09 LAB — COMPREHENSIVE METABOLIC PANEL
ALT: 206 U/L — ABNORMAL HIGH (ref 0–44)
AST: 65 U/L — ABNORMAL HIGH (ref 15–41)
Albumin: 3.2 g/dL — ABNORMAL LOW (ref 3.5–5.0)
Alkaline Phosphatase: 142 U/L — ABNORMAL HIGH (ref 38–126)
Anion gap: 7 (ref 5–15)
BUN: 5 mg/dL — ABNORMAL LOW (ref 6–20)
CO2: 24 mmol/L (ref 22–32)
Calcium: 8.8 mg/dL — ABNORMAL LOW (ref 8.9–10.3)
Chloride: 105 mmol/L (ref 98–111)
Creatinine, Ser: 0.58 mg/dL (ref 0.44–1.00)
GFR calc non Af Amer: >60 mL/min (ref >60)
Glucose, Bld: 81 mg/dL (ref 70–99)
Potassium: 4 mmol/L (ref 3.5–5.1)
Sodium: 136 mmol/L (ref 135–145)
Total Bilirubin: 1 mg/dL (ref 0.3–1.2)
Total Protein: 6 g/dL — ABNORMAL LOW (ref 6.5–8.1)

## 2020-07-09 LAB — MAGNESIUM: Magnesium: 2 mg/dL (ref 1.7–2.4)

## 2020-07-09 LAB — LIPASE, BLOOD: Lipase: 43 U/L (ref 11–51)

## 2020-07-09 MED ORDER — CHLORHEXIDINE GLUCONATE CLOTH 2 % EX PADS
6.0000 | MEDICATED_PAD | Freq: Once | CUTANEOUS | Status: AC
  Administered 2020-07-09: 6 via TOPICAL

## 2020-07-09 MED ORDER — CHLORHEXIDINE GLUCONATE CLOTH 2 % EX PADS: 6.0000 | MEDICATED_PAD | Freq: Once | CUTANEOUS | Status: DC

## 2020-07-09 MED ORDER — CIPROFLOXACIN IN D5W 400 MG/200ML IV SOLN
400.0000 mg | INTRAVENOUS | Status: AC
  Administered 2020-07-10: 400 mg via INTRAVENOUS
  Filled 2020-07-09: qty 200

## 2020-07-09 NOTE — Progress Notes (Signed)
GI Inpatient Follow-up Note  Patient Identification: Annette Gould is a 19 y.o. female past medical history of GERD & anxiety who was admitted on 07/08/2020 for abdominal pain radiating to her back.  Also had similar symptoms February 2021.  On admission had elevated transaminases with a lipase of 301.  CT showed thickened gallbladder wall.  MRCP yesterday showed cholelithiasis without evidence acute cholecystitis but no choledocholithiasis.  Reports issues with intermittent abdominal pain nausea vomiting since March 2021.  She had an endoscopy in July 2021.  She reports after endoscopy going gluten-free and actually feeling better for past month until acutely 4 nights ago developed severe abdominal pain, n/v and admitted.  Subjective: overall feeling some better but did have slightly worsening abd pain radiating to back with dinner last night (Chicken broth and jello). Nausea controlled w/ antiemetics. Currently states pain is 4-6/10. Mother at bedside.   Scheduled Inpatient Medications:  . enoxaparin (LOVENOX) injection  40 mg Subcutaneous Q24H  . pantoprazole (PROTONIX) IV  40 mg Intravenous Q24H    Continuous Inpatient Infusions:   . lactated ringers 150 mL/hr at 07/09/20 0854    PRN Inpatient Medications:  acetaminophen **OR** acetaminophen, HYDROmorphone (DILAUDID) injection, ondansetron **OR** ondansetron (ZOFRAN) IV  Review of Systems: Constitutional: Weight is stable.  Eyes: No changes in vision. ENT: No oral lesions, sore throat.  GI: see HPI.  Heme/Lymph: No easy bruising.  CV: No chest pain.  GU: No hematuria.  Integumentary: No rashes.  Neuro: No headaches.  Psych: No depression/anxiety.  Endocrine: No heat/cold intolerance.  Allergic/Immunologic: No urticaria.  Resp: No cough, SOB.  Musculoskeletal: No joint swelling.    Physical Examination: BP 121/72 (BP Location: Left Arm)   Pulse (!) 107   Temp 98.4 F (36.9 C) (Oral)   Resp 20   Ht 5\' 2"  (1.575 m)    Wt 63.5 kg   LMP 06/24/2020   SpO2 100%   BMI 25.61 kg/m  Gen: NAD, alert and oriented x 4 HEENT: PEERLA, EOMI, Neck: supple, no JVD or thyromegaly Chest: CTA bilaterally, no wheezes, crackles, or other adventitious sounds CV: RRR, no m/g/c/r Abd: soft, mild diffuse TTP, ND, +BS in all four quadrants; no HSM, guarding, ridigity, or rebound tenderness Ext: no edema, well perfused with 2+ pulses, Skin: no rash or lesions noted Lymph: no LAD  Data: Lab Results  Component Value Date   WBC 5.7 07/09/2020   HGB 12.0 07/09/2020   HCT 36.5 07/09/2020   MCV 80.2 07/09/2020   PLT 245 07/09/2020   Recent Labs  Lab 07/08/20 0240 07/09/20 0546  HGB 13.7 12.0   Lab Results  Component Value Date   NA 136 07/09/2020   K 4.0 07/09/2020   CL 105 07/09/2020   CO2 24 07/09/2020   BUN 5 (L) 07/09/2020   CREATININE 0.58 07/09/2020   Lab Results  Component Value Date   ALT 206 (H) 07/09/2020   AST 65 (H) 07/09/2020   ALKPHOS 142 (H) 07/09/2020   BILITOT 1.0 07/09/2020   No results for input(s): APTT, INR, PTT in the last 168 hours.    IMPRESSION: 07/08/20 1. Cholelithiasis without evidence of acute cholecystitis. 2. No evidence of choledocholithiasis. No findings of biliary tract obstruction. 3. No imaging findings to suggest an acute pancreatitis at this Time.  EGD 04/15/20- - Extrinsic compression in the proximal esophagus.   - Normal stomach. Biopsied. - Normal duodenal bulb.  - Mucosal changes in the duodenum. Biopsied. - Biopsies were obtained in  the gastric body, at the incisura and in the gastric antrum   RUQ [place] 07/08/20 -  Cholelithiasis with borderline gallbladder wall thickening. These findings could be indicative of early acute cholecystitis. In this regard, it may be prudent to correlate with nuclear medicine hepatobiliary imaging study to assess for cystic duct patency    Assessment/Plan: Annette Gould is a 19 y.o. female    1. Gallstone pancreatitis -  LFTs have trended down compared to yesterday.  MRCP without evidence of choledocholithiasis.  Surgery planning for CCY tomorrow. Given still having mild pain w/ CLD will not advance further.    2.  Extrinsic compression on recent EGD 04/2020 (incidental finding on EGD for eval of symptoms) - plans for EUS tomorrow which will need to be rescheduled. Dr Karilyn Cota notified Dr Christella Hartigan of this.     Recommendations: 1. Trend LFTS daily  2. CCY per surgery 3. Reschedule EUS w/ Dr Christella Hartigan on outpatient basis.  4. Clear liquid diet today  Please call with questions or concerns.    Bluford Kaufmann, PA-C [facility]

## 2020-07-09 NOTE — Progress Notes (Signed)
PROGRESS NOTE  Annette MichaelsKayla M Gould ZOX:[id number] DOB: 2001-04-22 DOA: 07/08/2020 PCP: Gweneth DimitriMcNeill, Wendy, MD  Brief History:  19 y.o. year old female with history of GERD who presented to the emergency room 10/5 for abdominal pain, chest pain, and back pain that has been waxing and waning over the last 4 days.  She states she has actually had intermittent abdominal pain, nausea and vomiting that she has been experiencing for the past 6 months.  She had a negative HIDA scan on 01/08/20.  EGD done on 04/15/20 showed extrinsic compression of the proximal esophagus. there were plans for her to have an EGD later this month in [place].  She reports that her pain usually starts after meals.  She says that 4 days ago she started having severe epigastric abdominal pain that radiated into the back.  She tried to come to the emergency department but the wait was more than 10 hours and so she went back home and managed with being n.p.o.  She reports that for the past 2 days she has not eaten or drink anything. She ate a banana and experienced severe recurrent abdominal pain that radiated into the back and she presented to the emergency department.  Initial AST 234, ALT 330, Lipase 301, Tbili2.6  Assessment/Plan: Gallstone Pancreatitis -10/5 CT abd--nondistended abd with ill-defined GB wall and potential GB thickening -RUQ US--cholelithiasis with borderline GB thickening -10/5 MRCP--no choledocholithiasis -07/10/20--planning lap chole -appreciate surgical consult--discussed with Dr. Lovell SheehanJenkins -triglycerides 63  Hypokalemia -replete -mag 2.0  GERD -continue PPI  External esophagus compression -Regarding her esophageal external compression, patient is scheduled to undergo EUS with Dr. Christella HartiganJacobs on 07/10/2020 -will need rescheduled      Status is: Inpatient  Remains inpatient appropriate because:IV treatments appropriate due to intensity of illness or inability to take PO   Dispo: The patient is  from: Home              Anticipated d/c is to: Home              Anticipated d/c date is: 1 day              Patient currently is not medically stable to d/c.        Family Communication:   Mother updated at bedside 10/6  Consultants:  GI, general surgery  Code Status:  FULL   DVT Prophylaxis:  Sarah Ann Lovenox   Procedures: As Listed in Progress Note Above  Antibiotics: None      Subjective: Pain is controlled with IV opioid.  N/v are improving.  Denies cp, sob, f/c, cp, sob  Objective: Vitals:   07/08/20 2017 07/08/20 2023 07/09/20 0628 07/09/20 1421  BP:  112/66 121/72 113/77  Pulse:  70 (!) 107 73  Resp:  20 20 17   Temp:  98.7 F (37.1 C) 98.4 F (36.9 C) 99.1 F (37.3 C)  TempSrc:  Oral Oral Oral  SpO2: 98% 100% 100% 100%  Weight:      Height:        Intake/Output Summary (Last 24 hours) at 07/09/2020 1654 Last data filed at 07/09/2020 1547 Gross per 24 hour  Intake 4269.47 ml  Output --  Net 4269.47 ml   Weight change:  Exam:   General:  Pt is alert, follows commands appropriately, not in acute distress  HEENT: No icterus, No thrush, No neck mass, [place]/AT  Cardiovascular: RRR, S1/S2, no rubs, no gallops  Respiratory: CTA bilaterally, no  wheezing, no crackles, no rhonchi  Abdomen: Soft/+BS, epigastric tender, non distended, no guarding  Extremities: No edema, No lymphangitis, No petechiae, No rashes, no synovitis   Data Reviewed: I have personally reviewed following labs and imaging studies Basic Metabolic Panel: Recent Labs  Lab 07/08/20 0240 07/09/20 0546  NA 136 136  K 3.4* 4.0  CL 104 105  CO2 23 24  GLUCOSE 98 81  BUN 5* 5*  CREATININE 0.58 0.58  CALCIUM 8.9 8.8*  MG 2.2 2.0   Liver Function Tests: Recent Labs  Lab 07/08/20 0240 07/09/20 0546  AST 234* 65*  ALT 330* 206*  ALKPHOS 187* 142*  BILITOT 2.6* 1.0  PROT 6.8 6.0*  ALBUMIN 4.0 3.2*   Recent Labs  Lab 07/08/20 0240 07/09/20 0546  LIPASE 301* 43   No  results for input(s): AMMONIA in the last 168 hours. Coagulation Profile: No results for input(s): INR, PROTIME in the last 168 hours. CBC: Recent Labs  Lab 07/08/20 0240 07/09/20 0546  WBC 9.2 5.7  NEUTROABS 5.8 2.9  HGB 13.7 12.0  HCT 39.8 36.5  MCV 78.7* 80.2  PLT 316 245   Cardiac Enzymes: No results for input(s): CKTOTAL, CKMB, CKMBINDEX, TROPONINI in the last 168 hours. BNP: Invalid input(s): POCBNP CBG: No results for input(s): GLUCAP in the last 168 hours. HbA1C: No results for input(s): HGBA1C in the last 72 hours. Urine analysis:    Component Value Date/Time   COLORURINE YELLOW 12/28/2019 0850   APPEARANCEUR HAZY (A) 12/28/2019 0850   LABSPEC 1.018 12/28/2019 0850   PHURINE 6.0 12/28/2019 0850   GLUCOSEU NEGATIVE 12/28/2019 0850   HGBUR NEGATIVE 12/28/2019 0850   BILIRUBINUR NEGATIVE 12/28/2019 0850   KETONESUR 20 (A) 12/28/2019 0850   PROTEINUR NEGATIVE 12/28/2019 0850   NITRITE NEGATIVE 12/28/2019 0850   LEUKOCYTESUR NEGATIVE 12/28/2019 0850   Sepsis Labs: @LABRCNTIP (procalcitonin:4,lacticidven:4) ) Recent Results (from the past 240 hour(s))  SARS CORONAVIRUS 2 (Jahmarion Popoff 6-24 HRS) Nasopharyngeal Nasopharyngeal Swab     Status: None   Collection Time: 07/07/20  9:42 AM   Specimen: Nasopharyngeal Swab  Result Value Ref Range Status   SARS Coronavirus 2 NEGATIVE NEGATIVE Final    Comment: (NOTE) SARS-CoV-2 target nucleic acids are NOT DETECTED.  The SARS-CoV-2 RNA is generally detectable in upper and lower respiratory specimens during the acute phase of infection. Negative results do not preclude SARS-CoV-2 infection, do not rule out co-infections with other pathogens, and should not be used as the sole basis for treatment or other patient management decisions. Negative results must be combined with clinical observations, patient history, and epidemiological information. The expected result is Negative.  Fact Sheet for  Patients: 09/06/20  Fact Sheet for Healthcare Providers: HairSlick.no  This test is not yet approved or cleared by the [employer] FDA and  has been authorized for detection and/or diagnosis of SARS-CoV-2 by FDA under an Emergency Use Authorization (EUA). This EUA will remain  in effect (meaning this test can be used) for the duration of the COVID-19 declaration under Se ction 564(b)(1) of the Act, 21 U.S.C. section 360bbb-3(b)(1), unless the authorization is terminated or revoked sooner.  Performed at [facility], [address] [place], [place]   Respiratory Panel by RT PCR (Flu A&B, Covid) - Nasopharyngeal Swab     Status: None   Collection Time: 07/08/20  4:15 AM   Specimen: Nasopharyngeal Swab  Result Value Ref Range Status   SARS Coronavirus 2 by RT PCR NEGATIVE NEGATIVE Final  Comment: (NOTE) SARS-CoV-2 target nucleic acids are NOT DETECTED.  The SARS-CoV-2 RNA is generally detectable in upper respiratoy specimens during the acute phase of infection. The lowest concentration of SARS-CoV-2 viral copies this assay can detect is 131 copies/mL. A negative result does not preclude SARS-Cov-2 infection and should not be used as the sole basis for treatment or other patient management decisions. A negative result may occur with  improper specimen collection/handling, submission of specimen other than nasopharyngeal swab, presence of viral mutation(s) within the areas targeted by this assay, and inadequate number of viral copies (<131 copies/mL). A negative result must be combined with clinical observations, patient history, and epidemiological information. The expected result is Negative.  Fact Sheet for Patients:  https://www.moore.com/  Fact Sheet for Healthcare Providers:  https://www.young.biz/  This test is no t yet approved or cleared by the  [place] FDA and  has been authorized for detection and/or diagnosis of SARS-CoV-2 by FDA under an Emergency Use Authorization (EUA). This EUA will remain  in effect (meaning this test can be used) for the duration of the COVID-19 declaration under Section 564(b)(1) of the Act, 21 U.S.C. section 360bbb-3(b)(1), unless the authorization is terminated or revoked sooner.     Influenza A by PCR NEGATIVE NEGATIVE Final   Influenza B by PCR NEGATIVE NEGATIVE Final    Comment: (NOTE) The Xpert Xpress SARS-CoV-2/FLU/RSV assay is intended as an aid in  the diagnosis of influenza from Nasopharyngeal swab specimens and  should not be used as a sole basis for treatment. Nasal washings and  aspirates are unacceptable for Xpert Xpress SARS-CoV-2/FLU/RSV  testing.  Fact Sheet for Patients: https://www.moore.com/  Fact Sheet for Healthcare Providers: https://www.young.biz/  This test is not yet approved or cleared by the [place] FDA and  has been authorized for detection and/or diagnosis of SARS-CoV-2 by  FDA under an Emergency Use Authorization (EUA). This EUA will remain  in effect (meaning this test can be used) for the duration of the  Covid-19 declaration under Section 564(b)(1) of the Act, 21  U.S.C. section 360bbb-3(b)(1), unless the authorization is  terminated or revoked. Performed at [facility], [address], [place], [place] [address]      Scheduled Meds: . Chlorhexidine Gluconate Cloth  6 each Topical Once   And  . Chlorhexidine Gluconate Cloth  6 each Topical Once  . enoxaparin (LOVENOX) injection  40 mg Subcutaneous Q24H  . pantoprazole (PROTONIX) IV  40 mg Intravenous Q24H   Continuous Infusions: . [START ON 07/10/2020] ciprofloxacin    . lactated ringers 150 mL/hr at 07/09/20 1547    Procedures/Studies: CT ABDOMEN PELVIS W CONTRAST  Result Date: 07/08/2020 CLINICAL DATA:  Epigastric pain. EXAM: CT ABDOMEN AND PELVIS  WITH CONTRAST TECHNIQUE: Multidetector CT imaging of the abdomen and pelvis was performed using the standard protocol following bolus administration of intravenous contrast. CONTRAST:  OMNIPAQUE IOHEXOL 300 MG/ML  SOLN COMPARISON:  None. FINDINGS: Lower chest: Unremarkable. Hepatobiliary: No suspicious focal abnormality within the liver parenchyma. Gallbladder is nondistended with ill definition of the gallbladder wall and potential mild gallbladder wall thickening. No intrahepatic or extrahepatic biliary dilation. Pancreas: No focal mass lesion. No dilatation of the main duct. No intraparenchymal cyst. No peripancreatic edema. Spleen: No splenomegaly. No focal mass lesion. Adrenals/Urinary Tract: No adrenal nodule or mass. Kidneys unremarkable. No evidence for hydroureter. The urinary bladder appears normal for the degree of distention. Stomach/Bowel: Stomach is unremarkable. No gastric wall thickening. No evidence of outlet obstruction. No perigastric  edema or inflammation. Duodenum is normally positioned as is the ligament of Treitz. No small bowel wall thickening. No small bowel dilatation. The terminal ileum is normal. The appendix is normal. No gross colonic mass. No colonic wall thickening. Vascular/Lymphatic: No abdominal aortic aneurysm. No abdominal aortic atherosclerotic calcification. There is no gastrohepatic or hepatoduodenal ligament lymphadenopathy. No retroperitoneal or mesenteric lymphadenopathy. No pelvic sidewall lymphadenopathy. Reproductive: The uterus is unremarkable.  There is no adnexal mass. Other: No intraperitoneal free fluid. Musculoskeletal: No worrisome lytic or sclerotic osseous abnormality. IMPRESSION: 1. Gallbladder is nondistended with ill definition of the gallbladder wall and potential mild gallbladder wall thickening. No gallstones by CT. Right upper quadrant ultrasound may prove helpful to further evaluate as clinically warranted. 2. Otherwise unremarkable exam.  Electronically Signed   By: Kennith Center M.D.   On: 07/08/2020 05:55   MR 3D Recon At Scanner  Result Date: 07/08/2020 CLINICAL DATA:  19 year old female with history of right upper quadrant abdominal pain with elevated liver enzymes. Suspected biliary tract obstruction and gallstone pancreatitis. EXAM: MRI ABDOMEN WITHOUT AND WITH CONTRAST (INCLUDING MRCP) TECHNIQUE: Multiplanar multisequence MR imaging of the abdomen was performed both before and after the administration of intravenous contrast. Heavily T2-weighted images of the biliary and pancreatic ducts were obtained, and three-dimensional MRCP images were rendered by post processing. CONTRAST:  7mL GADAVIST GADOBUTROL 1 MMOL/ML IV SOLN COMPARISON:  No prior abdominal MRI. CT the abdomen and pelvis 07/08/2020. FINDINGS: Lower chest: Unremarkable. Hepatobiliary: No suspicious cystic or solid hepatic lesions. Small filling defects are noted near the fundus of the gallbladder, compatible with tiny gallstones. Gallbladder does not appear distended. There is no gallbladder wall thickening or pericholecystic fluid. No intra or extrahepatic biliary ductal dilatation noted on MRCP images. Common bile duct measures 4 mm in the porta hepatis. No filling defects in the common bile duct to suggest choledocholithiasis. Pancreas: No pancreatic mass. No pancreatic ductal dilatation. No pancreatic or peripancreatic fluid collections or inflammatory changes. Spleen:  Unremarkable. Adrenals/Urinary Tract: Bilateral kidneys and adrenal glands are normal in appearance. No hydroureteronephrosis in the visualized portions of the abdomen. Stomach/Bowel: Visualized portions are unremarkable. Vascular/Lymphatic: No aneurysm identified in the visualized abdominal vasculature. No lymphadenopathy noted in the abdomen. Other: No significant volume of ascites noted in the visualized portions of the peritoneal cavity. Musculoskeletal: No aggressive appearing osseous lesions are noted  in visualized portions of the peritoneal cavity. IMPRESSION: 1. Cholelithiasis without evidence of acute cholecystitis. 2. No evidence of choledocholithiasis. No findings of biliary tract obstruction. 3. No imaging findings to suggest an acute pancreatitis at this time. Electronically Signed   By: Trudie Reed M.D.   On: 07/08/2020 15:13   MR ABDOMEN MRCP W WO CONTAST  Result Date: 07/08/2020 CLINICAL DATA:  19 year old female with history of right upper quadrant abdominal pain with elevated liver enzymes. Suspected biliary tract obstruction and gallstone pancreatitis. EXAM: MRI ABDOMEN WITHOUT AND WITH CONTRAST (INCLUDING MRCP) TECHNIQUE: Multiplanar multisequence MR imaging of the abdomen was performed both before and after the administration of intravenous contrast. Heavily T2-weighted images of the biliary and pancreatic ducts were obtained, and three-dimensional MRCP images were rendered by post processing. CONTRAST:  7mL GADAVIST GADOBUTROL 1 MMOL/ML IV SOLN COMPARISON:  No prior abdominal MRI. CT the abdomen and pelvis 07/08/2020. FINDINGS: Lower chest: Unremarkable. Hepatobiliary: No suspicious cystic or solid hepatic lesions. Small filling defects are noted near the fundus of the gallbladder, compatible with tiny gallstones. Gallbladder does not appear distended. There is no gallbladder wall thickening  or pericholecystic fluid. No intra or extrahepatic biliary ductal dilatation noted on MRCP images. Common bile duct measures 4 mm in the porta hepatis. No filling defects in the common bile duct to suggest choledocholithiasis. Pancreas: No pancreatic mass. No pancreatic ductal dilatation. No pancreatic or peripancreatic fluid collections or inflammatory changes. Spleen:  Unremarkable. Adrenals/Urinary Tract: Bilateral kidneys and adrenal glands are normal in appearance. No hydroureteronephrosis in the visualized portions of the abdomen. Stomach/Bowel: Visualized portions are unremarkable.  Vascular/Lymphatic: No aneurysm identified in the visualized abdominal vasculature. No lymphadenopathy noted in the abdomen. Other: No significant volume of ascites noted in the visualized portions of the peritoneal cavity. Musculoskeletal: No aggressive appearing osseous lesions are noted in visualized portions of the peritoneal cavity. IMPRESSION: 1. Cholelithiasis without evidence of acute cholecystitis. 2. No evidence of choledocholithiasis. No findings of biliary tract obstruction. 3. No imaging findings to suggest an acute pancreatitis at this time. Electronically Signed   By: Trudie Reed M.D.   On: 07/08/2020 15:13   US Abdomen Limited RUQ  Result Date: 07/08/2020 CLINICAL DATA:  Right upper quadrant pain EXAM: ULTRASOUND ABDOMEN LIMITED RIGHT UPPER QUADRANT COMPARISON:  CT abdomen and pelvis July 08, 2020; abdominal ultrasound December 28, 2019 FINDINGS: Gallbladder: Within the gallbladder, there are echogenic foci which move and shadow consistent with cholelithiasis. Gallbladder wall is borderline thickened without edema. No pericholecystic fluid. No sonographic Murphy sign noted by sonographer. Common bile duct: Diameter: 3 mm. No intrahepatic or extrahepatic biliary duct dilatation. Liver: No focal lesion identified. Within normal limits in parenchymal echogenicity. Portal vein is patent on color Doppler imaging with normal direction of blood flow towards the liver. Other: None. IMPRESSION: Cholelithiasis with borderline gallbladder wall thickening. These findings could be indicative of early acute cholecystitis. In this regard, it may be prudent to correlate with nuclear medicine hepatobiliary imaging study to assess for cystic duct patency. Study otherwise unremarkable. Electronically Signed   By: Bretta Bang III M.D.   On: 07/08/2020 08:15    Catarina Hartshorn, DO  [facility]  If 7PM-7AM, please contact night-coverage [email or web address] Password [email or web address] 07/09/2020, 4:54 PM   LOS: 1 day

## 2020-07-09 NOTE — Plan of Care (Signed)

## 2020-07-09 NOTE — Telephone Encounter (Signed)
The pt has been cancelled will call her to schedule at a later time.

## 2020-07-09 NOTE — Progress Notes (Signed)
Subjective: Patient feels better today.  Tolerating clear liquids.  Objective: Vital signs in last 24 hours: Temp:  [98.4 F (36.9 C)-98.7 F (37.1 C)] 98.4 F (36.9 C) (10/06 0628) Pulse Rate:  [70-107] 107 (10/06 0628) Resp:  [20] 20 (10/06 0628) BP: (112-121)/(66-72) 121/72 (10/06 0628) SpO2:  [98 %-100 %] 100 % (10/06 0628) Last BM Date: 07/07/20  Intake/Output from previous day: 10/05 0701 - 10/06 0700 In: 2703.8 [P.O.:240; I.V.:2463.8] Out: -  Intake/Output this shift: No intake/output data recorded.  General appearance: alert, cooperative and no distress GI: Soft without significant tenderness or distention.  Lab Results:  Recent Labs    07/08/20 0240 07/09/20 0546  WBC 9.2 5.7  HGB 13.7 12.0  HCT 39.8 36.5  PLT 316 245   BMET Recent Labs    07/08/20 0240 07/09/20 0546  NA 136 136  K 3.4* 4.0  CL 104 105  CO2 23 24  GLUCOSE 98 81  BUN 5* 5*  CREATININE 0.58 0.58  CALCIUM 8.9 8.8*   PT/INR No results for input(s): LABPROT, INR in the last 72 hours.  Studies/Results: CT ABDOMEN PELVIS W CONTRAST  Result Date: 07/08/2020 CLINICAL DATA:  Epigastric pain. EXAM: CT ABDOMEN AND PELVIS WITH CONTRAST TECHNIQUE: Multidetector CT imaging of the abdomen and pelvis was performed using the standard protocol following bolus administration of intravenous contrast. CONTRAST:  OMNIPAQUE IOHEXOL 300 MG/ML  SOLN COMPARISON:  None. FINDINGS: Lower chest: Unremarkable. Hepatobiliary: No suspicious focal abnormality within the liver parenchyma. Gallbladder is nondistended with ill definition of the gallbladder wall and potential mild gallbladder wall thickening. No intrahepatic or extrahepatic biliary dilation. Pancreas: No focal mass lesion. No dilatation of the main duct. No intraparenchymal cyst. No peripancreatic edema. Spleen: No splenomegaly. No focal mass lesion. Adrenals/Urinary Tract: No adrenal nodule or mass. Kidneys unremarkable. No evidence for  hydroureter. The urinary bladder appears normal for the degree of distention. Stomach/Bowel: Stomach is unremarkable. No gastric wall thickening. No evidence of outlet obstruction. No perigastric edema or inflammation. Duodenum is normally positioned as is the ligament of Treitz. No small bowel wall thickening. No small bowel dilatation. The terminal ileum is normal. The appendix is normal. No gross colonic mass. No colonic wall thickening. Vascular/Lymphatic: No abdominal aortic aneurysm. No abdominal aortic atherosclerotic calcification. There is no gastrohepatic or hepatoduodenal ligament lymphadenopathy. No retroperitoneal or mesenteric lymphadenopathy. No pelvic sidewall lymphadenopathy. Reproductive: The uterus is unremarkable.  There is no adnexal mass. Other: No intraperitoneal free fluid. Musculoskeletal: No worrisome lytic or sclerotic osseous abnormality. IMPRESSION: 1. Gallbladder is nondistended with ill definition of the gallbladder wall and potential mild gallbladder wall thickening. No gallstones by CT. Right upper quadrant ultrasound may prove helpful to further evaluate as clinically warranted. 2. Otherwise unremarkable exam. Electronically Signed   By: Kennith Center M.D.   On: 07/08/2020 05:55   MR 3D Recon At Scanner  Result Date: 07/08/2020 CLINICAL DATA:  19 year old female with history of right upper quadrant abdominal pain with elevated liver enzymes. Suspected biliary tract obstruction and gallstone pancreatitis. EXAM: MRI ABDOMEN WITHOUT AND WITH CONTRAST (INCLUDING MRCP) TECHNIQUE: Multiplanar multisequence MR imaging of the abdomen was performed both before and after the administration of intravenous contrast. Heavily T2-weighted images of the biliary and pancreatic ducts were obtained, and three-dimensional MRCP images were rendered by post processing. CONTRAST:  10mL GADAVIST GADOBUTROL 1 MMOL/ML IV SOLN COMPARISON:  No prior abdominal MRI. CT the abdomen and pelvis 07/08/2020.  FINDINGS: Lower chest: Unremarkable. Hepatobiliary: No suspicious cystic or  solid hepatic lesions. Small filling defects are noted near the fundus of the gallbladder, compatible with tiny gallstones. Gallbladder does not appear distended. There is no gallbladder wall thickening or pericholecystic fluid. No intra or extrahepatic biliary ductal dilatation noted on MRCP images. Common bile duct measures 4 mm in the porta hepatis. No filling defects in the common bile duct to suggest choledocholithiasis. Pancreas: No pancreatic mass. No pancreatic ductal dilatation. No pancreatic or peripancreatic fluid collections or inflammatory changes. Spleen:  Unremarkable. Adrenals/Urinary Tract: Bilateral kidneys and adrenal glands are normal in appearance. No hydroureteronephrosis in the visualized portions of the abdomen. Stomach/Bowel: Visualized portions are unremarkable. Vascular/Lymphatic: No aneurysm identified in the visualized abdominal vasculature. No lymphadenopathy noted in the abdomen. Other: No significant volume of ascites noted in the visualized portions of the peritoneal cavity. Musculoskeletal: No aggressive appearing osseous lesions are noted in visualized portions of the peritoneal cavity. IMPRESSION: 1. Cholelithiasis without evidence of acute cholecystitis. 2. No evidence of choledocholithiasis. No findings of biliary tract obstruction. 3. No imaging findings to suggest an acute pancreatitis at this time. Electronically Signed   By: Trudie Reed M.D.   On: 07/08/2020 15:13   MR ABDOMEN MRCP W WO CONTAST  Result Date: 07/08/2020 CLINICAL DATA:  19 year old female with history of right upper quadrant abdominal pain with elevated liver enzymes. Suspected biliary tract obstruction and gallstone pancreatitis. EXAM: MRI ABDOMEN WITHOUT AND WITH CONTRAST (INCLUDING MRCP) TECHNIQUE: Multiplanar multisequence MR imaging of the abdomen was performed both before and after the administration of intravenous  contrast. Heavily T2-weighted images of the biliary and pancreatic ducts were obtained, and three-dimensional MRCP images were rendered by post processing. CONTRAST:  36mL GADAVIST GADOBUTROL 1 MMOL/ML IV SOLN COMPARISON:  No prior abdominal MRI. CT the abdomen and pelvis 07/08/2020. FINDINGS: Lower chest: Unremarkable. Hepatobiliary: No suspicious cystic or solid hepatic lesions. Small filling defects are noted near the fundus of the gallbladder, compatible with tiny gallstones. Gallbladder does not appear distended. There is no gallbladder wall thickening or pericholecystic fluid. No intra or extrahepatic biliary ductal dilatation noted on MRCP images. Common bile duct measures 4 mm in the porta hepatis. No filling defects in the common bile duct to suggest choledocholithiasis. Pancreas: No pancreatic mass. No pancreatic ductal dilatation. No pancreatic or peripancreatic fluid collections or inflammatory changes. Spleen:  Unremarkable. Adrenals/Urinary Tract: Bilateral kidneys and adrenal glands are normal in appearance. No hydroureteronephrosis in the visualized portions of the abdomen. Stomach/Bowel: Visualized portions are unremarkable. Vascular/Lymphatic: No aneurysm identified in the visualized abdominal vasculature. No lymphadenopathy noted in the abdomen. Other: No significant volume of ascites noted in the visualized portions of the peritoneal cavity. Musculoskeletal: No aggressive appearing osseous lesions are noted in visualized portions of the peritoneal cavity. IMPRESSION: 1. Cholelithiasis without evidence of acute cholecystitis. 2. No evidence of choledocholithiasis. No findings of biliary tract obstruction. 3. No imaging findings to suggest an acute pancreatitis at this time. Electronically Signed   By: Trudie Reed M.D.   On: 07/08/2020 15:13   US Abdomen Limited RUQ  Result Date: 07/08/2020 CLINICAL DATA:  Right upper quadrant pain EXAM: ULTRASOUND ABDOMEN LIMITED RIGHT UPPER QUADRANT  COMPARISON:  CT abdomen and pelvis July 08, 2020; abdominal ultrasound December 28, 2019 FINDINGS: Gallbladder: Within the gallbladder, there are echogenic foci which move and shadow consistent with cholelithiasis. Gallbladder wall is borderline thickened without edema. No pericholecystic fluid. No sonographic Murphy sign noted by sonographer. Common bile duct: Diameter: 3 mm. No intrahepatic or extrahepatic biliary duct dilatation. Liver: No  focal lesion identified. Within normal limits in parenchymal echogenicity. Portal vein is patent on color Doppler imaging with normal direction of blood flow towards the liver. Other: None. IMPRESSION: Cholelithiasis with borderline gallbladder wall thickening. These findings could be indicative of early acute cholecystitis. In this regard, it may be prudent to correlate with nuclear medicine hepatobiliary imaging study to assess for cystic duct patency. Study otherwise unremarkable. Electronically Signed   By: Bretta Bang III M.D.   On: 07/08/2020 08:15    Anti-infectives: Anti-infectives (From admission, onward)   None      Assessment/Plan: Impression: Gallstone pancreatitis.  Ultrasound does show cholelithiasis.  This was confirmed by MRCP.  The common bile duct is small and without choledocholithiasis.  Liver tests as well as lipase are normalizing. Plan: We will proceed with laparoscopic cholecystectomy with intraoperative cholangiograms tomorrow.  The risks and benefits of the procedure including bleeding, infection, hepatobiliary injury, and the possibility of an open procedure were fully explained to the patient, who gave informed consent.  LOS: 1 day    [name] 07/09/2020

## 2020-07-09 NOTE — H&P (View-Only) (Signed)
Subjective: Patient feels better today.  Tolerating clear liquids.  Objective: Vital signs in last 24 hours: Temp:  [98.4 F (36.9 C)-98.7 F (37.1 C)] 98.4 F (36.9 C) (10/06 0628) Pulse Rate:  [70-107] 107 (10/06 0628) Resp:  [20] 20 (10/06 0628) BP: (112-121)/(66-72) 121/72 (10/06 0628) SpO2:  [98 %-100 %] 100 % (10/06 0628) Last BM Date: 07/07/20  Intake/Output from previous day: 10/05 0701 - 10/06 0700 In: 2703.8 [P.O.:240; I.V.:2463.8] Out: -  Intake/Output this shift: No intake/output data recorded.  General appearance: alert, cooperative and no distress GI: Soft without significant tenderness or distention.  Lab Results:  Recent Labs    07/08/20 0240 07/09/20 0546  WBC 9.2 5.7  HGB 13.7 12.0  HCT 39.8 36.5  PLT 316 245   BMET Recent Labs    07/08/20 0240 07/09/20 0546  NA 136 136  K 3.4* 4.0  CL 104 105  CO2 23 24  GLUCOSE 98 81  BUN 5* 5*  CREATININE 0.58 0.58  CALCIUM 8.9 8.8*   PT/INR No results for input(s): LABPROT, INR in the last 72 hours.  Studies/Results: CT ABDOMEN PELVIS W CONTRAST  Result Date: 07/08/2020 CLINICAL DATA:  Epigastric pain. EXAM: CT ABDOMEN AND PELVIS WITH CONTRAST TECHNIQUE: Multidetector CT imaging of the abdomen and pelvis was performed using the standard protocol following bolus administration of intravenous contrast. CONTRAST:  OMNIPAQUE IOHEXOL 300 MG/ML  SOLN COMPARISON:  None. FINDINGS: Lower chest: Unremarkable. Hepatobiliary: No suspicious focal abnormality within the liver parenchyma. Gallbladder is nondistended with ill definition of the gallbladder wall and potential mild gallbladder wall thickening. No intrahepatic or extrahepatic biliary dilation. Pancreas: No focal mass lesion. No dilatation of the main duct. No intraparenchymal cyst. No peripancreatic edema. Spleen: No splenomegaly. No focal mass lesion. Adrenals/Urinary Tract: No adrenal nodule or mass. Kidneys unremarkable. No evidence for  hydroureter. The urinary bladder appears normal for the degree of distention. Stomach/Bowel: Stomach is unremarkable. No gastric wall thickening. No evidence of outlet obstruction. No perigastric edema or inflammation. Duodenum is normally positioned as is the ligament of Treitz. No small bowel wall thickening. No small bowel dilatation. The terminal ileum is normal. The appendix is normal. No gross colonic mass. No colonic wall thickening. Vascular/Lymphatic: No abdominal aortic aneurysm. No abdominal aortic atherosclerotic calcification. There is no gastrohepatic or hepatoduodenal ligament lymphadenopathy. No retroperitoneal or mesenteric lymphadenopathy. No pelvic sidewall lymphadenopathy. Reproductive: The uterus is unremarkable.  There is no adnexal mass. Other: No intraperitoneal free fluid. Musculoskeletal: No worrisome lytic or sclerotic osseous abnormality. IMPRESSION: 1. Gallbladder is nondistended with ill definition of the gallbladder wall and potential mild gallbladder wall thickening. No gallstones by CT. Right upper quadrant ultrasound may prove helpful to further evaluate as clinically warranted. 2. Otherwise unremarkable exam. Electronically Signed   By: Kennith Center M.D.   On: 07/08/2020 05:55   MR 3D Recon At Scanner  Result Date: 07/08/2020 CLINICAL DATA:  19 year old female with history of right upper quadrant abdominal pain with elevated liver enzymes. Suspected biliary tract obstruction and gallstone pancreatitis. EXAM: MRI ABDOMEN WITHOUT AND WITH CONTRAST (INCLUDING MRCP) TECHNIQUE: Multiplanar multisequence MR imaging of the abdomen was performed both before and after the administration of intravenous contrast. Heavily T2-weighted images of the biliary and pancreatic ducts were obtained, and three-dimensional MRCP images were rendered by post processing. CONTRAST:  10mL GADAVIST GADOBUTROL 1 MMOL/ML IV SOLN COMPARISON:  No prior abdominal MRI. CT the abdomen and pelvis 07/08/2020.  FINDINGS: Lower chest: Unremarkable. Hepatobiliary: No suspicious cystic or  solid hepatic lesions. Small filling defects are noted near the fundus of the gallbladder, compatible with tiny gallstones. Gallbladder does not appear distended. There is no gallbladder wall thickening or pericholecystic fluid. No intra or extrahepatic biliary ductal dilatation noted on MRCP images. Common bile duct measures 4 mm in the porta hepatis. No filling defects in the common bile duct to suggest choledocholithiasis. Pancreas: No pancreatic mass. No pancreatic ductal dilatation. No pancreatic or peripancreatic fluid collections or inflammatory changes. Spleen:  Unremarkable. Adrenals/Urinary Tract: Bilateral kidneys and adrenal glands are normal in appearance. No hydroureteronephrosis in the visualized portions of the abdomen. Stomach/Bowel: Visualized portions are unremarkable. Vascular/Lymphatic: No aneurysm identified in the visualized abdominal vasculature. No lymphadenopathy noted in the abdomen. Other: No significant volume of ascites noted in the visualized portions of the peritoneal cavity. Musculoskeletal: No aggressive appearing osseous lesions are noted in visualized portions of the peritoneal cavity. IMPRESSION: 1. Cholelithiasis without evidence of acute cholecystitis. 2. No evidence of choledocholithiasis. No findings of biliary tract obstruction. 3. No imaging findings to suggest an acute pancreatitis at this time. Electronically Signed   By: Trudie Reed M.D.   On: 07/08/2020 15:13   MR ABDOMEN MRCP W WO CONTAST  Result Date: 07/08/2020 CLINICAL DATA:  19 year old female with history of right upper quadrant abdominal pain with elevated liver enzymes. Suspected biliary tract obstruction and gallstone pancreatitis. EXAM: MRI ABDOMEN WITHOUT AND WITH CONTRAST (INCLUDING MRCP) TECHNIQUE: Multiplanar multisequence MR imaging of the abdomen was performed both before and after the administration of intravenous  contrast. Heavily T2-weighted images of the biliary and pancreatic ducts were obtained, and three-dimensional MRCP images were rendered by post processing. CONTRAST:  36mL GADAVIST GADOBUTROL 1 MMOL/ML IV SOLN COMPARISON:  No prior abdominal MRI. CT the abdomen and pelvis 07/08/2020. FINDINGS: Lower chest: Unremarkable. Hepatobiliary: No suspicious cystic or solid hepatic lesions. Small filling defects are noted near the fundus of the gallbladder, compatible with tiny gallstones. Gallbladder does not appear distended. There is no gallbladder wall thickening or pericholecystic fluid. No intra or extrahepatic biliary ductal dilatation noted on MRCP images. Common bile duct measures 4 mm in the porta hepatis. No filling defects in the common bile duct to suggest choledocholithiasis. Pancreas: No pancreatic mass. No pancreatic ductal dilatation. No pancreatic or peripancreatic fluid collections or inflammatory changes. Spleen:  Unremarkable. Adrenals/Urinary Tract: Bilateral kidneys and adrenal glands are normal in appearance. No hydroureteronephrosis in the visualized portions of the abdomen. Stomach/Bowel: Visualized portions are unremarkable. Vascular/Lymphatic: No aneurysm identified in the visualized abdominal vasculature. No lymphadenopathy noted in the abdomen. Other: No significant volume of ascites noted in the visualized portions of the peritoneal cavity. Musculoskeletal: No aggressive appearing osseous lesions are noted in visualized portions of the peritoneal cavity. IMPRESSION: 1. Cholelithiasis without evidence of acute cholecystitis. 2. No evidence of choledocholithiasis. No findings of biliary tract obstruction. 3. No imaging findings to suggest an acute pancreatitis at this time. Electronically Signed   By: Trudie Reed M.D.   On: 07/08/2020 15:13   US Abdomen Limited RUQ  Result Date: 07/08/2020 CLINICAL DATA:  Right upper quadrant pain EXAM: ULTRASOUND ABDOMEN LIMITED RIGHT UPPER QUADRANT  COMPARISON:  CT abdomen and pelvis July 08, 2020; abdominal ultrasound December 28, 2019 FINDINGS: Gallbladder: Within the gallbladder, there are echogenic foci which move and shadow consistent with cholelithiasis. Gallbladder wall is borderline thickened without edema. No pericholecystic fluid. No sonographic Murphy sign noted by sonographer. Common bile duct: Diameter: 3 mm. No intrahepatic or extrahepatic biliary duct dilatation. Liver: No  focal lesion identified. Within normal limits in parenchymal echogenicity. Portal vein is patent on color Doppler imaging with normal direction of blood flow towards the liver. Other: None. IMPRESSION: Cholelithiasis with borderline gallbladder wall thickening. These findings could be indicative of early acute cholecystitis. In this regard, it may be prudent to correlate with nuclear medicine hepatobiliary imaging study to assess for cystic duct patency. Study otherwise unremarkable. Electronically Signed   By: Bretta Bang III M.D.   On: 07/08/2020 08:15    Anti-infectives: Anti-infectives (From admission, onward)   None      Assessment/Plan: Impression: Gallstone pancreatitis.  Ultrasound does show cholelithiasis.  This was confirmed by MRCP.  The common bile duct is small and without choledocholithiasis.  Liver tests as well as lipase are normalizing. Plan: We will proceed with laparoscopic cholecystectomy with intraoperative cholangiograms tomorrow.  The risks and benefits of the procedure including bleeding, infection, hepatobiliary injury, and the possibility of an open procedure were fully explained to the patient, who gave informed consent.  LOS: 1 day    [name] 07/09/2020

## 2020-07-10 ENCOUNTER — Encounter (HOSPITAL_COMMUNITY): Admission: RE | Payer: Self-pay | Source: Home / Self Care

## 2020-07-10 ENCOUNTER — Encounter (HOSPITAL_COMMUNITY): Payer: Self-pay | Admitting: Family Medicine

## 2020-07-10 ENCOUNTER — Inpatient Hospital Stay (HOSPITAL_COMMUNITY): Payer: Commercial Managed Care - PPO

## 2020-07-10 ENCOUNTER — Encounter (HOSPITAL_COMMUNITY)
Admission: EM | Disposition: A | Discharge status: Home / Self Care | Payer: Self-pay | Source: Home / Self Care | Attending: Internal Medicine

## 2020-07-10 ENCOUNTER — Inpatient Hospital Stay (HOSPITAL_COMMUNITY): Payer: Commercial Managed Care - PPO | Admitting: Anesthesiology

## 2020-07-10 ENCOUNTER — Ambulatory Visit (HOSPITAL_COMMUNITY)
Admission: RE | Admit: 2020-07-10 | Payer: Commercial Managed Care - PPO | Source: Home / Self Care | Admitting: Gastroenterology

## 2020-07-10 DIAGNOSIS — R7401 Elevation of levels of liver transaminase levels: Secondary | ICD-10-CM

## 2020-07-10 DIAGNOSIS — K851 Biliary acute pancreatitis without necrosis or infection: Secondary | ICD-10-CM

## 2020-07-10 DIAGNOSIS — K8050 Calculus of bile duct without cholangitis or cholecystitis without obstruction: Secondary | ICD-10-CM

## 2020-07-10 HISTORY — PX: CHOLECYSTECTOMY: SHX55

## 2020-07-10 LAB — CBC WITH DIFFERENTIAL/PLATELET
Abs Immature Granulocytes: 0.01 10*3/uL (ref 0.00–0.07)
Basophils Absolute: 0 10*3/uL (ref 0.0–0.1)
Basophils Relative: 0 %
Eosinophils Absolute: 0.1 10*3/uL (ref 0.0–0.5)
Eosinophils Relative: 2 %
HCT: 37.3 % (ref 36.0–46.0)
Hemoglobin: 12.6 g/dL (ref 12.0–15.0)
Immature Granulocytes: 0 %
Lymphocytes Relative: 39 %
Lymphs Abs: 2.5 10*3/uL (ref 0.7–4.0)
MCH: 26.9 pg (ref 26.0–34.0)
MCHC: 33.8 g/dL (ref 30.0–36.0)
MCV: 79.5 fL — ABNORMAL LOW (ref 80.0–100.0)
Monocytes Absolute: 0.6 10*3/uL (ref 0.1–1.0)
Monocytes Relative: 9 %
Neutro Abs: 3.2 10*3/uL (ref 1.7–7.7)
Neutrophils Relative %: 50 %
Platelets: 277 10*3/uL (ref 150–400)
RBC: 4.69 MIL/uL (ref 3.87–5.11)
RDW: 12.4 % (ref 11.5–15.5)
WBC: 6.4 10*3/uL (ref 4.0–10.5)
nRBC: 0 % (ref 0.0–0.2)

## 2020-07-10 LAB — COMPREHENSIVE METABOLIC PANEL
ALT: 181 U/L — ABNORMAL HIGH (ref 0–44)
AST: 67 U/L — ABNORMAL HIGH (ref 15–41)
Albumin: 3.5 g/dL (ref 3.5–5.0)
Alkaline Phosphatase: 214 U/L — ABNORMAL HIGH (ref 38–126)
Anion gap: 8 (ref 5–15)
BUN: 5 mg/dL — ABNORMAL LOW (ref 6–20)
CO2: 24 mmol/L (ref 22–32)
Calcium: 9.1 mg/dL (ref 8.9–10.3)
Chloride: 104 mmol/L (ref 98–111)
Creatinine, Ser: 0.6 mg/dL (ref 0.44–1.00)
GFR calc non Af Amer: >60 mL/min (ref >60)
Glucose, Bld: 90 mg/dL (ref 70–99)
Potassium: 3.6 mmol/L (ref 3.5–5.1)
Sodium: 136 mmol/L (ref 135–145)
Total Bilirubin: 0.7 mg/dL (ref 0.3–1.2)
Total Protein: 6.4 g/dL — ABNORMAL LOW (ref 6.5–8.1)

## 2020-07-10 LAB — HEPATIC FUNCTION PANEL
ALT: 180 U/L — ABNORMAL HIGH (ref 0–44)
AST: 68 U/L — ABNORMAL HIGH (ref 15–41)
Albumin: 3.5 g/dL (ref 3.5–5.0)
Alkaline Phosphatase: 211 U/L — ABNORMAL HIGH (ref 38–126)
Bilirubin, Direct: 0.2 mg/dL (ref 0.0–0.2)
Indirect Bilirubin: 0.4 mg/dL (ref 0.3–0.9)
Total Bilirubin: 0.6 mg/dL (ref 0.3–1.2)
Total Protein: 6.5 g/dL (ref 6.5–8.1)

## 2020-07-10 LAB — MAGNESIUM: Magnesium: 1.9 mg/dL (ref 1.7–2.4)

## 2020-07-10 LAB — LIPASE, BLOOD: Lipase: 49 U/L (ref 11–51)

## 2020-07-10 IMAGING — RF DG CHOLANGIOGRAM OPERATIVE
1 series · 9 of 9 positions shown · non-contrast
Comparison: MRCP from [DATE]

CLINICAL DATA: 19-year-old female with history of gallstone
pancreatitis.

EXAM:
INTRAOPERATIVE CHOLANGIOGRAM
TECHNIQUE: Cholangiographic images from the C-arm fluoroscopic device were
submitted for interpretation post-operatively. Please see the
procedural report for the amount of contrast and the fluoroscopy
time utilized.

[Series 1: run · 3 acquisitions, 9 frames shown]
[im 1/3]
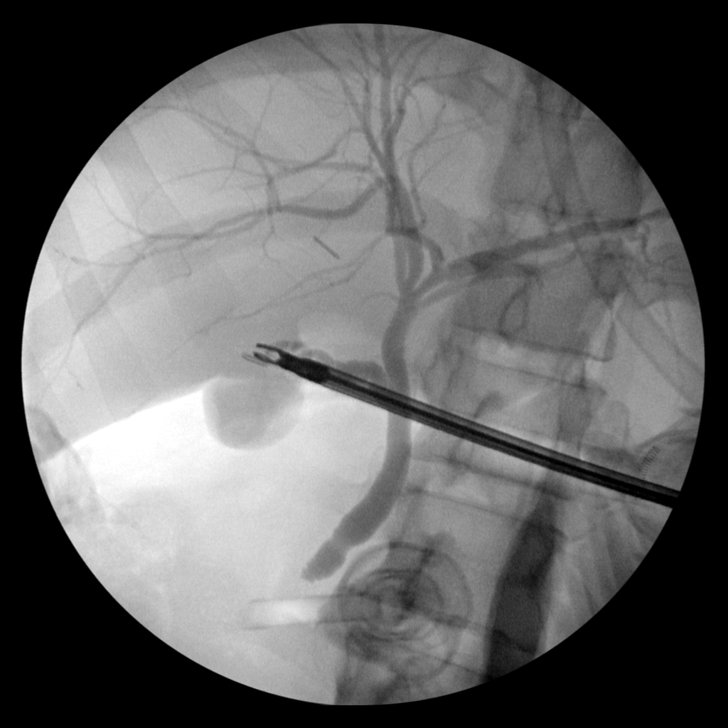
[im 1/3]
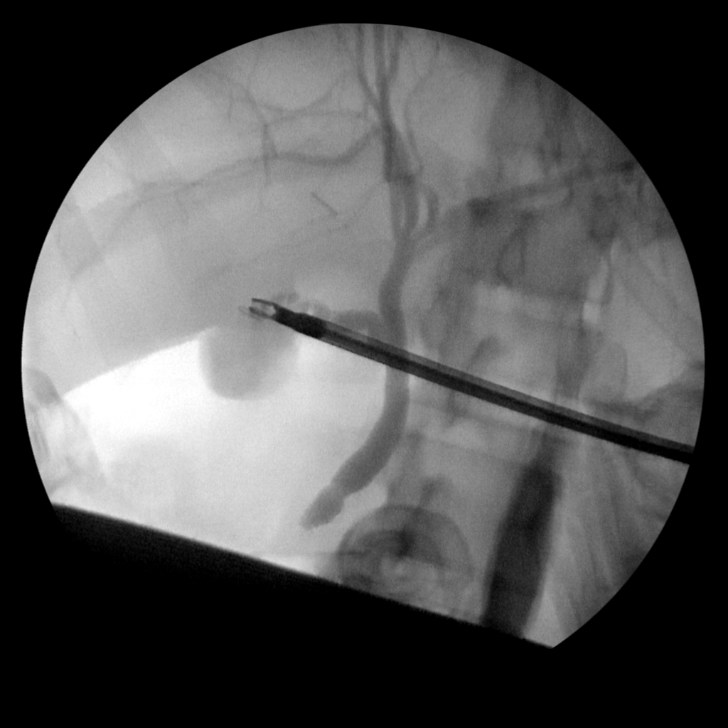
[im 1/3]
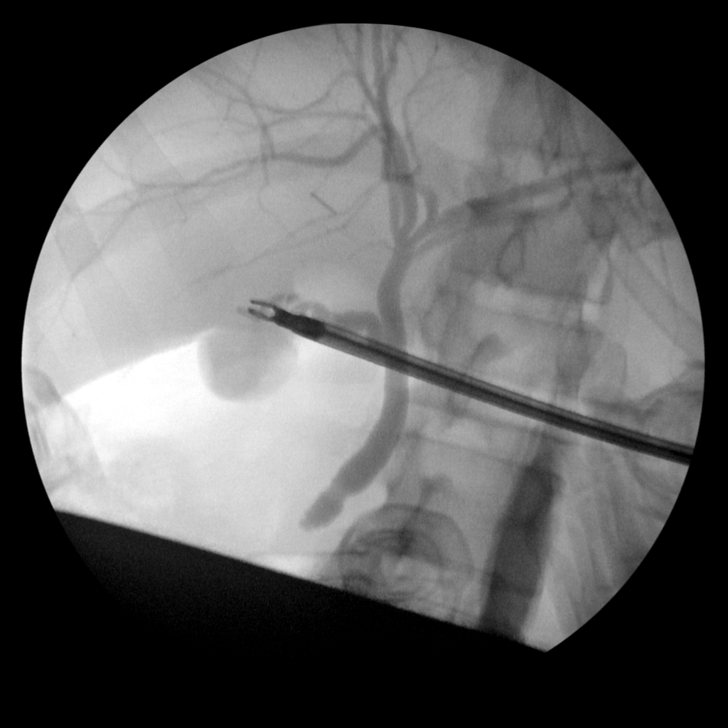
[im 1/3]
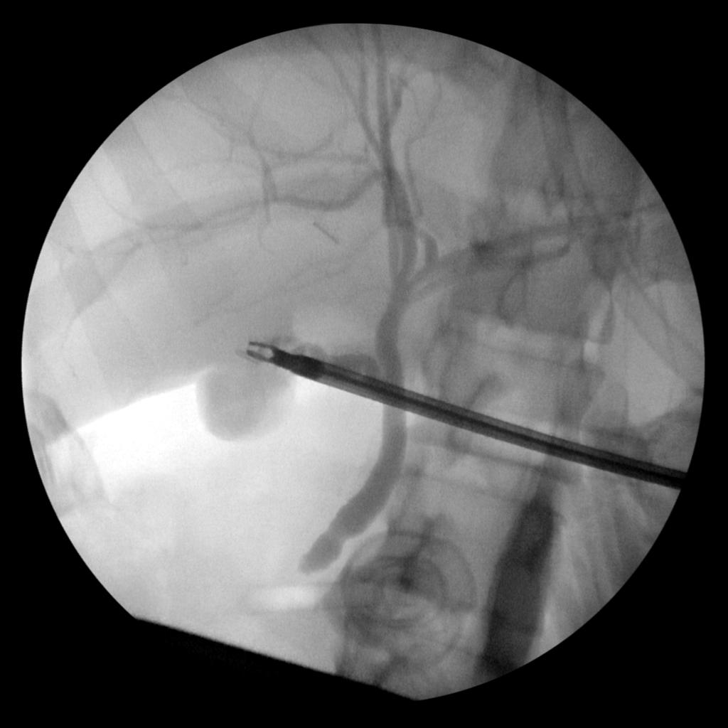
[im 2/3]
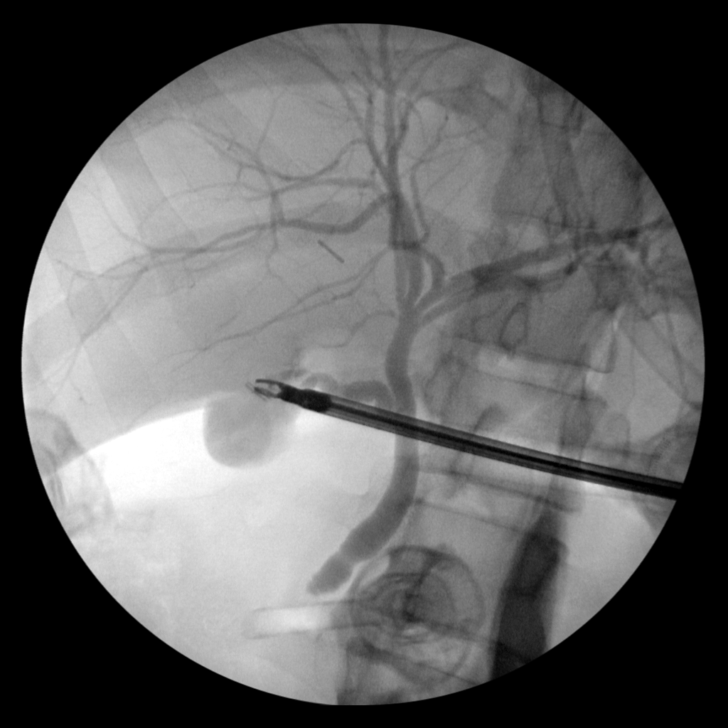
[im 2/3]
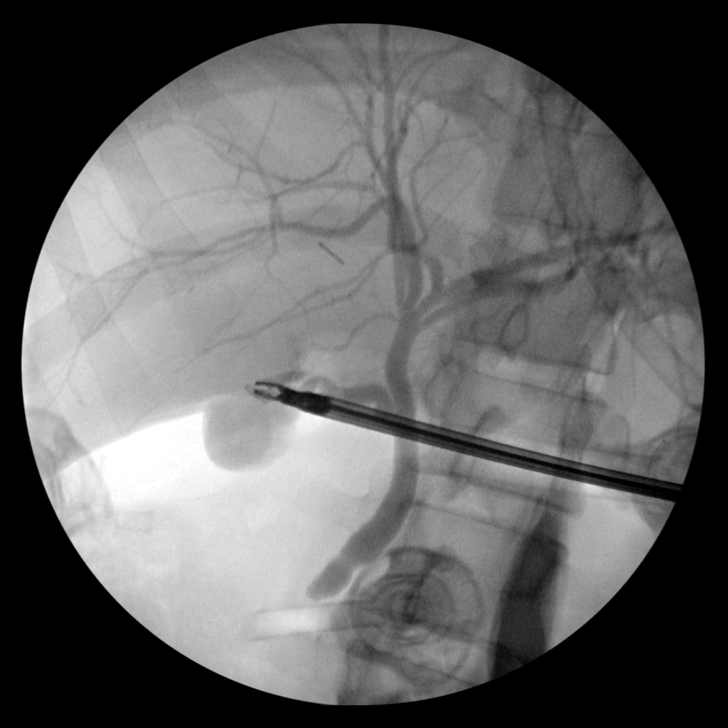
[im 2/3]
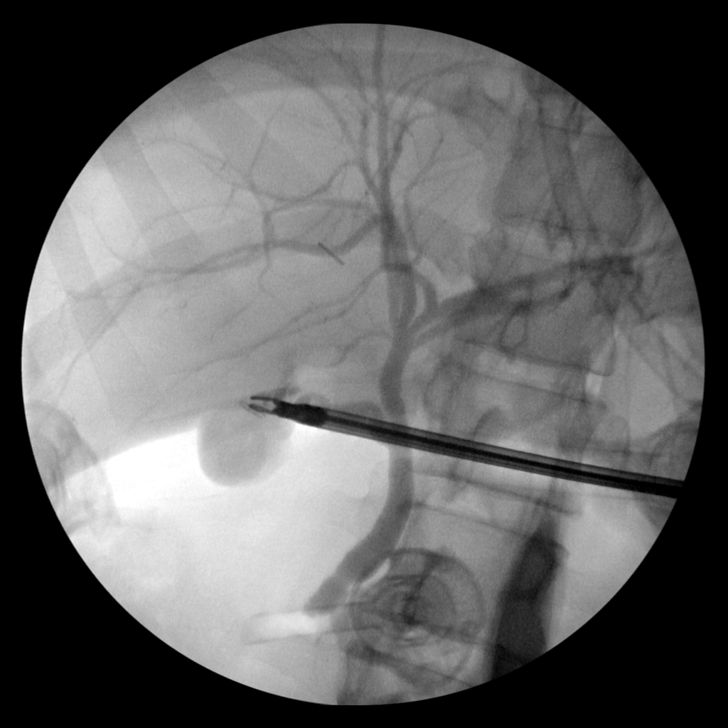
[im 2/3]
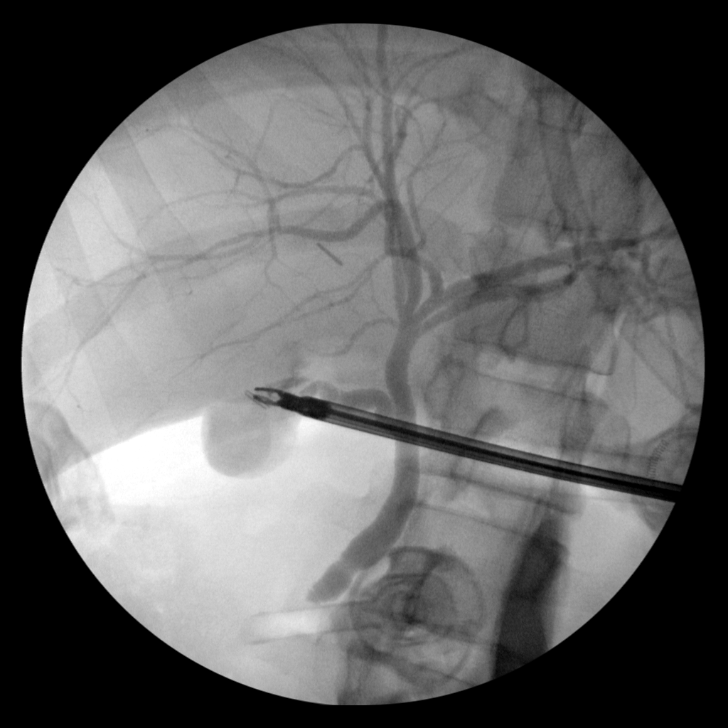
[im 3/3]
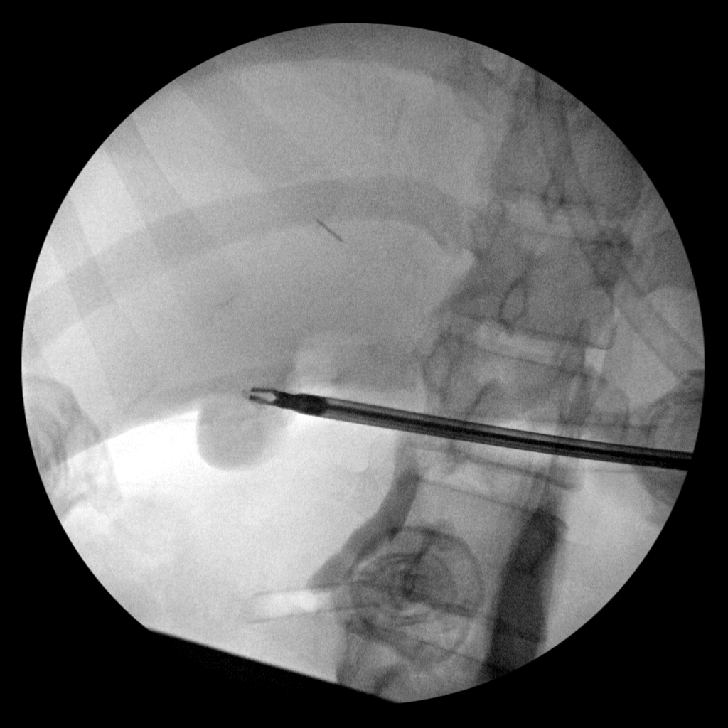

[9 of 9 positions shown; findings below may reference images not displayed]

FINDINGS: Single fluoroscopic image store from intraoperative cholangiogram
demonstrates injection into the gallbladder lumen with patent cystic
duct. There is mild dilation of the common bile duct and no
significant dilation of the intrahepatic biliary tree. On the images
provided, contrast is not visualized spilling into the duodenum and
there is mild irregularity of the distal common bile duct.
IMPRESSION: No definite evidence of choledocholithiasis, however contrast is not
visualized spilling into the duodenum through the ampulla of Vater.

## 2020-07-10 SURGERY — LAPAROSCOPIC CHOLECYSTECTOMY WITH INTRAOPERATIVE CHOLANGIOGRAM
Anesthesia: General | Site: Abdomen

## 2020-07-10 SURGERY — UPPER ENDOSCOPIC ULTRASOUND (EUS) RADIAL
Anesthesia: Monitor Anesthesia Care

## 2020-07-10 MED ORDER — SODIUM CHLORIDE 0.9 % IV SOLN: INTRAVENOUS | Status: DC

## 2020-07-10 MED ORDER — ORAL CARE MOUTH RINSE: 15.0000 mL | Freq: Once | OROMUCOSAL | Status: AC

## 2020-07-10 MED ORDER — KETOROLAC TROMETHAMINE 30 MG/ML IJ SOLN
30.0000 mg | Freq: Four times a day (QID) | INTRAMUSCULAR | Status: AC
  Administered 2020-07-10: 30 mg via INTRAVENOUS
  Filled 2020-07-10: qty 1

## 2020-07-10 MED ORDER — ROCURONIUM BROMIDE 10 MG/ML (PF) SYRINGE
PREFILLED_SYRINGE | INTRAVENOUS | Status: AC
  Filled 2020-07-10: qty 10

## 2020-07-10 MED ORDER — MIDAZOLAM HCL 2 MG/2ML IJ SOLN
INTRAMUSCULAR | Status: DC | PRN
  Administered 2020-07-10: 2 mg via INTRAVENOUS

## 2020-07-10 MED ORDER — ONDANSETRON HCL 4 MG/2ML IJ SOLN: 4.0000 mg | Freq: Once | INTRAMUSCULAR | Status: DC | PRN

## 2020-07-10 MED ORDER — NEOSTIGMINE METHYLSULFATE 3 MG/3ML IV SOSY
PREFILLED_SYRINGE | INTRAVENOUS | Status: AC
  Filled 2020-07-10: qty 3

## 2020-07-10 MED ORDER — FENTANYL CITRATE (PF) 100 MCG/2ML IJ SOLN: 50.0000 ug | INTRAMUSCULAR | Status: DC | PRN

## 2020-07-10 MED ORDER — SCOPOLAMINE 1 MG/3DAYS TD PT72
MEDICATED_PATCH | TRANSDERMAL | Status: AC
  Filled 2020-07-10: qty 1

## 2020-07-10 MED ORDER — KETOROLAC TROMETHAMINE 30 MG/ML IJ SOLN
30.0000 mg | Freq: Four times a day (QID) | INTRAMUSCULAR | Status: DC | PRN
  Administered 2020-07-10 – 2020-07-12 (×4): 30 mg via INTRAVENOUS
  Filled 2020-07-10 (×4): qty 1

## 2020-07-10 MED ORDER — DEXAMETHASONE SODIUM PHOSPHATE 10 MG/ML IJ SOLN
INTRAMUSCULAR | Status: AC
  Filled 2020-07-10: qty 1

## 2020-07-10 MED ORDER — GLYCOPYRROLATE PF 0.2 MG/ML IJ SOSY
PREFILLED_SYRINGE | INTRAMUSCULAR | Status: AC
  Filled 2020-07-10: qty 1

## 2020-07-10 MED ORDER — SIMETHICONE 80 MG PO CHEW: 40.0000 mg | CHEWABLE_TABLET | Freq: Four times a day (QID) | ORAL | Status: DC | PRN

## 2020-07-10 MED ORDER — LACTATED RINGERS IV SOLN: INTRAVENOUS | Status: DC

## 2020-07-10 MED ORDER — BUPIVACAINE LIPOSOME 1.3 % IJ SUSP
INTRAMUSCULAR | Status: DC | PRN
  Administered 2020-07-10: 20 mL

## 2020-07-10 MED ORDER — LORAZEPAM 2 MG/ML IJ SOLN: 1.0000 mg | INTRAMUSCULAR | Status: DC | PRN

## 2020-07-10 MED ORDER — FENTANYL CITRATE (PF) 250 MCG/5ML IJ SOLN
INTRAMUSCULAR | Status: AC
  Filled 2020-07-10: qty 5

## 2020-07-10 MED ORDER — SCOPOLAMINE 1 MG/3DAYS TD PT72
1.0000 | MEDICATED_PATCH | Freq: Once | TRANSDERMAL | Status: DC
  Administered 2020-07-10: 1.5 mg via TRANSDERMAL

## 2020-07-10 MED ORDER — BUPIVACAINE LIPOSOME 1.3 % IJ SUSP
INTRAMUSCULAR | Status: AC
  Filled 2020-07-10: qty 20

## 2020-07-10 MED ORDER — GLYCOPYRROLATE 0.2 MG/ML IJ SOLN
INTRAMUSCULAR | Status: DC | PRN
  Administered 2020-07-10: .4 mg via INTRAVENOUS

## 2020-07-10 MED ORDER — FENTANYL CITRATE (PF) 100 MCG/2ML IJ SOLN
INTRAMUSCULAR | Status: DC | PRN
  Administered 2020-07-10: 50 ug via INTRAVENOUS
  Administered 2020-07-10: 100 ug via INTRAVENOUS
  Administered 2020-07-10 (×2): 50 ug via INTRAVENOUS

## 2020-07-10 MED ORDER — LIDOCAINE 2% (20 MG/ML) 5 ML SYRINGE
INTRAMUSCULAR | Status: AC
  Filled 2020-07-10: qty 5

## 2020-07-10 MED ORDER — SODIUM CHLORIDE 0.9 % IV SOLN
INTRAVENOUS | Status: DC | PRN
  Administered 2020-07-10: 30 mL

## 2020-07-10 MED ORDER — DEXAMETHASONE SODIUM PHOSPHATE 10 MG/ML IJ SOLN
INTRAMUSCULAR | Status: DC | PRN
  Administered 2020-07-10: 8 mg via INTRAVENOUS

## 2020-07-10 MED ORDER — MIDAZOLAM HCL 2 MG/2ML IJ SOLN
INTRAMUSCULAR | Status: AC
  Filled 2020-07-10: qty 2

## 2020-07-10 MED ORDER — HYDROCODONE-ACETAMINOPHEN 5-325 MG PO TABS
1.0000 | ORAL_TABLET | ORAL | Status: DC | PRN
  Administered 2020-07-10 (×2): 2 via ORAL
  Administered 2020-07-11: 1 via ORAL
  Administered 2020-07-12: 2 via ORAL
  Administered 2020-07-12: 1 via ORAL
  Filled 2020-07-10: qty 2
  Filled 2020-07-10: qty 1
  Filled 2020-07-10: qty 2
  Filled 2020-07-10: qty 1
  Filled 2020-07-10: qty 2
  Filled 2020-07-10: qty 1

## 2020-07-10 MED ORDER — HEMOSTATIC AGENTS (NO CHARGE) OPTIME
TOPICAL | Status: DC | PRN
  Administered 2020-07-10 (×2): 1

## 2020-07-10 MED ORDER — 0.9 % SODIUM CHLORIDE (POUR BTL) OPTIME
TOPICAL | Status: DC | PRN
  Administered 2020-07-10 (×2): 1000 mL

## 2020-07-10 MED ORDER — PROPOFOL 10 MG/ML IV BOLUS
INTRAVENOUS | Status: DC | PRN
  Administered 2020-07-10: 150 mg via INTRAVENOUS
  Administered 2020-07-10: 50 mg via INTRAVENOUS

## 2020-07-10 MED ORDER — ONDANSETRON HCL 4 MG/2ML IJ SOLN
INTRAMUSCULAR | Status: DC | PRN
  Administered 2020-07-10: 4 mg via INTRAVENOUS

## 2020-07-10 MED ORDER — ROCURONIUM BROMIDE 100 MG/10ML IV SOLN
INTRAVENOUS | Status: DC | PRN
  Administered 2020-07-10: 40 mg via INTRAVENOUS

## 2020-07-10 MED ORDER — HYDROMORPHONE HCL 1 MG/ML IJ SOLN
1.0000 mg | INTRAMUSCULAR | Status: DC | PRN
  Administered 2020-07-10: 1 mg via INTRAVENOUS
  Administered 2020-07-10: 0.5 mg via INTRAVENOUS
  Administered 2020-07-10 – 2020-07-11 (×4): 1 mg via INTRAVENOUS
  Filled 2020-07-10 (×6): qty 1

## 2020-07-10 MED ORDER — HYDROMORPHONE HCL 1 MG/ML IJ SOLN
0.2500 mg | INTRAMUSCULAR | Status: DC | PRN
  Administered 2020-07-10 (×2): 0.5 mg via INTRAVENOUS
  Filled 2020-07-10 (×2): qty 0.5

## 2020-07-10 MED ORDER — ONDANSETRON HCL 4 MG/2ML IJ SOLN
INTRAMUSCULAR | Status: AC
  Filled 2020-07-10: qty 2

## 2020-07-10 MED ORDER — NEOSTIGMINE METHYLSULFATE 10 MG/10ML IV SOLN
INTRAVENOUS | Status: DC | PRN
  Administered 2020-07-10: 3 mg via INTRAVENOUS

## 2020-07-10 MED ORDER — CHLORHEXIDINE GLUCONATE 0.12 % MT SOLN
15.0000 mL | Freq: Once | OROMUCOSAL | Status: AC
  Administered 2020-07-10: 15 mL via OROMUCOSAL

## 2020-07-10 SURGICAL SUPPLY — 47 items
ADH SKN CLS APL DERMABOND .7 (GAUZE/BANDAGES/DRESSINGS) ×1
APPLICATOR ARISTA FLEXITIP XL (MISCELLANEOUS) ×2 IMPLANT
APPLIER CLIP ROT 10 11.4 M/L (STAPLE) ×3
BAG RETRIEVAL 10 (BASKET) ×1
BAG RETRIEVAL 10MM (BASKET) ×1
CATH CHOLANGIOGRAM 4.5FR (CATHETERS) ×3 IMPLANT
CLIP APPLIE ROT 10 11.4 M/L (STAPLE) ×1 IMPLANT
CLOTH BEACON ORANGE TIMEOUT ST (SAFETY) ×3 IMPLANT
COVER LIGHT HANDLE STERIS (MISCELLANEOUS) ×6 IMPLANT
COVER WAND RF STERILE (DRAPES) ×3 IMPLANT
DERMABOND ADVANCED (GAUZE/BANDAGES/DRESSINGS) ×2
DERMABOND ADVANCED .7 DNX12 (GAUZE/BANDAGES/DRESSINGS) ×1 IMPLANT
DRAPE C-ARM FOLDED MOBILE STRL (DRAPES) ×3 IMPLANT
DURAPREP 26ML APPLICATOR (WOUND CARE) ×3 IMPLANT
ELECT REM PT RETURN 9FT ADLT (ELECTROSURGICAL) ×3
ELECTRODE REM PT RTRN 9FT ADLT (ELECTROSURGICAL) ×1 IMPLANT
GLOVE BIOGEL PI IND STRL 7.0 (GLOVE) ×3 IMPLANT
GLOVE BIOGEL PI INDICATOR 7.0 (GLOVE) ×6
GLOVE SURG SS PI 7.5 STRL IVOR (GLOVE) ×3 IMPLANT
GOWN STRL REUS W/TWL LRG LVL3 (GOWN DISPOSABLE) ×9 IMPLANT
HEMOSTAT ARISTA ABSORB 3G PWDR (HEMOSTASIS) ×2 IMPLANT
HEMOSTAT SNOW SURGICEL 2X4 (HEMOSTASIS) ×3 IMPLANT
INST SET LAPROSCOPIC AP (KITS) ×3 IMPLANT
KIT TURNOVER KIT A (KITS) ×3 IMPLANT
MANIFOLD NEPTUNE II (INSTRUMENTS) ×3 IMPLANT
NDL HYPO 18GX1.5 BLUNT FILL (NEEDLE) ×1 IMPLANT
NEEDLE HYPO 18GX1.5 BLUNT FILL (NEEDLE) ×3 IMPLANT
NEEDLE HYPO 22GX1.5 SAFETY (NEEDLE) ×3 IMPLANT
NEEDLE INSUFFLATION 120MM (ENDOMECHANICALS) ×3 IMPLANT
NS IRRIG 1000ML POUR BTL (IV SOLUTION) ×3 IMPLANT
PACK LAP CHOLE LZT030E (CUSTOM PROCEDURE TRAY) ×2 IMPLANT
PAD ARMBOARD 7.5X6 YLW CONV (MISCELLANEOUS) ×3 IMPLANT
SET BASIN LINEN APH (SET/KITS/TRAYS/PACK) ×3 IMPLANT
SET TUBE IRRIG SUCTION NO TIP (IRRIGATION / IRRIGATOR) IMPLANT
SET TUBE SMOKE EVAC HIGH FLOW (TUBING) ×3 IMPLANT
SLEEVE ENDOPATH XCEL 5M (ENDOMECHANICALS) ×3 IMPLANT
SUT MNCRL AB 4-0 PS2 18 (SUTURE) ×6 IMPLANT
SUT VICRYL 0 UR6 27IN ABS (SUTURE) ×3 IMPLANT
SYR 20ML LL LF (SYRINGE) ×6 IMPLANT
SYR 30ML LL (SYRINGE) ×3 IMPLANT
SYS BAG RETRIEVAL 10MM (BASKET) ×1
SYSTEM BAG RETRIEVAL 10MM (BASKET) ×1 IMPLANT
TROCAR ENDO BLADELESS 11MM (ENDOMECHANICALS) ×3 IMPLANT
TROCAR XCEL NON-BLD 5MMX100MML (ENDOMECHANICALS) ×3 IMPLANT
TROCAR XCEL UNIV SLVE 11M 100M (ENDOMECHANICALS) ×3 IMPLANT
WARMER LAPAROSCOPE (MISCELLANEOUS) ×2 IMPLANT
YANKAUER SUCT 12FT TUBE ARGYLE (SUCTIONS) ×3 IMPLANT

## 2020-07-10 NOTE — Progress Notes (Signed)
Subjective:  Patient continues to complain of upper back pain just like she had when she came to the hospital.  She also complains of upper abdominal pain.  She denies nausea vomiting or shortness of breath.  Current Medications:  Current Facility-Administered Medications:  .  acetaminophen (TYLENOL) tablet 650 mg, 650 mg, Oral, Q6H PRN **OR** acetaminophen (TYLENOL) suppository 650 mg, 650 mg, Rectal, Q6H PRN, Aviva Signs, MD .  [COMPLETED] 6 CHG cloth bath night before surgery, , , Once **AND** [COMPLETED] 6 CHG cloth bath AM of surgery, , , Once **AND** [COMPLETED] Chlorhexidine Gluconate Cloth 2 % PADS 6 each, 6 each, Topical, Once, 6 each at 07/09/20 2123 **AND** Chlorhexidine Gluconate Cloth 2 % PADS 6 each, 6 each, Topical, Once, Aviva Signs, MD .  HYDROcodone-acetaminophen (NORCO/VICODIN) 5-325 MG per tablet 1-2 tablet, 1-2 tablet, Oral, Q4H PRN, Aviva Signs, MD, 2 tablet at 07/10/20 1534 .  HYDROmorphone (DILAUDID) injection 1 mg, 1 mg, Intravenous, Q2H PRN, Aviva Signs, MD .  [COMPLETED] ketorolac (TORADOL) 30 MG/ML injection 30 mg, 30 mg, Intravenous, Q6H, 30 mg at 07/10/20 1224 **FOLLOWED BY** ketorolac (TORADOL) 30 MG/ML injection 30 mg, 30 mg, Intravenous, Q6H PRN, Aviva Signs, MD .  lactated ringers infusion, , Intravenous, Continuous, Aviva Signs, MD, Last Rate: 75 mL/hr at 07/10/20 1041, New Bag at 07/10/20 1041 .  LORazepam (ATIVAN) injection 1 mg, 1 mg, Intravenous, Q4H PRN, Aviva Signs, MD .  ondansetron [facility]) tablet 4 mg, 4 mg, Oral, Q6H PRN **OR** ondansetron (ZOFRAN) injection 4 mg, 4 mg, Intravenous, Q6H PRN, Aviva Signs, MD, 4 mg at 07/10/20 1310 .  pantoprazole (PROTONIX) injection 40 mg, 40 mg, Intravenous, Q24H, Aviva Signs, MD, 40 mg at 07/10/20 1033 .  simethicone (MYLICON) chewable tablet 40 mg, 40 mg, Oral, Q6H PRN, Aviva Signs, MD   Objective: Blood pressure (!) 101/46, pulse (!) 55, temperature (!) 97.5 F (36.4 C), temperature source  Oral, resp. rate 20, height 5' 2"  (1.575 m), weight 63.5 kg, last menstrual period 06/24/2020, SpO2 98 %. Patient is alert and does not appear to be in acute distress. Abdomen is symmetrical and soft.  She has mild tenderness across upper abdomen no organomegaly or masses.  No LE edema or clubbing noted.  Labs/studies Results:  CBC Latest Ref Rng & Units 07/10/2020 07/09/2020 07/08/2020  WBC 4.0 - 10.5 K/uL 6.4 5.7 9.2  Hemoglobin 12.0 - 15.0 g/dL 12.6 12.0 13.7  Hematocrit 36 - 46 % 37.3 36.5 39.8  Platelets 150 - 400 K/uL 277 245 316    CMP Latest Ref Rng & Units 07/10/2020 07/09/2020 07/08/2020  Glucose 70 - 99 mg/dL 90 81 98  BUN 6 - 20 mg/dL 5(L) 5(L) 5(L)  Creatinine 0.44 - 1.00 mg/dL 0.60 0.58 0.58  Sodium 135 - 145 mmol/L 136 136 136  Potassium 3.5 - 5.1 mmol/L 3.6 4.0 3.4(L)  Chloride 98 - 111 mmol/L 104 105 104  CO2 22 - 32 mmol/L 24 24 23   Calcium 8.9 - 10.3 mg/dL 9.1 8.8(L) 8.9  Total Protein 6.5 - 8.1 g/dL 6.4(L) 6.0(L) 6.8  Total Bilirubin 0.3 - 1.2 mg/dL 0.7 1.0 2.6(H)  Alkaline Phos 38 - 126 U/L 214(H) 142(H) 187(H)  AST 15 - 41 U/L 67(H) 65(H) 234(H)  ALT 0 - 44 U/L 181(H) 206(H) 330(H)    Hepatic Function Latest Ref Rng & Units 07/10/2020 07/09/2020 07/08/2020  Total Protein 6.5 - 8.1 g/dL 6.4(L) 6.0(L) 6.8  Albumin 3.5 - 5.0 g/dL 3.5 3.2(L) 4.0  AST 15 -  41 U/L 67(H) 65(H) 234(H)  ALT 0 - 44 U/L 181(H) 206(H) 330(H)  Alk Phosphatase 38 - 126 U/L 214(H) 142(H) 187(H)  Total Bilirubin 0.3 - 1.2 mg/dL 0.7 1.0 2.6(H)    Intraoperative cholangiogram reviewed by Dr. Markham Jordan earlier today.  There is complete obstruction in the distal CBD which is not dilated.  This finding is concerning for impacted stone.  Assessment:  #1.  Biliary pancreatitis.  Patient had CT ultrasound and MRCP and the study suggested patient had passed stone spontaneously which is also supported by symptomatic improvement and decreasing bilirubin and transaminases. However intraoperative  cholangiogram reveals distal obstruction concerning for impacted stone.  She is still having the pain that she had on presentation.  She would therefore benefit from ERCP with sphincterotomy and stone extraction.  #2.  Patient is status post laparoscopic cholecystectomy today.  Recommendations  ERCP with biliary sphincterotomy and stone extraction 07/11/2020. Patient to have repeat labs in a.m. Procedure and risks reviewed with patient and her mother who is at bedside including the risk of pancreatitis and bleeding.  Given that she is just recovering from biliary pancreatitis if pancreatic duct is cannulated may consider pancreatic stenting for prophylaxis. They are both agreeable.

## 2020-07-10 NOTE — Interval H&P Note (Signed)
History and Physical Interval Note:  07/10/2020 7:05 AM  Annette Gould  has presented today for surgery, with the diagnosis of gallstone pancreatitis.  The various methods of treatment have been discussed with the patient and family. After consideration of risks, benefits and other options for treatment, the patient has consented to  Procedure(s): LAPAROSCOPIC CHOLECYSTECTOMY WITH INTRAOPERATIVE CHOLANGIOGRAM (N/A) as a surgical intervention.  The patient's history has been reviewed, patient examined, no change in status, stable for surgery.  I have reviewed the patient's chart and labs.  Questions were answered to the patient's satisfaction.     [name]

## 2020-07-10 NOTE — Op Note (Signed)
Patient:  Annette Gould  DOB:  July 07, 2001  MRN:  [id number]   Preop Diagnosis: Gallstone pancreatitis  Postop Diagnosis: Same, choledocholithiasis  Procedure: Laparoscopic cholecystectomy with intraoperative cholangiograms  Surgeon: [name], MD  Anes: General endotracheal  Indications: Patient is a 19 year old white female who presents with gallstone pancreatitis.  The risks and benefits of the procedure including bleeding, infection, hepatobiliary injury, and the possibility of an open procedure were fully explained to the patient, who gave informed consent.  Procedure note: The patient was placed in the supine position.  After induction of general endotracheal anesthesia, the abdomen was prepped and draped using the usual sterile technique with ChloraPrep.  Surgical site confirmation was performed.  A supraumbilical incision was made down to the fascia.  A Veress needle was introduced into the abdominal cavity and confirmation of placement was done using the saline drop test.  The abdomen was then insufflated to 15 mmHg pressure.  An 11 mm trocar was introduced into the abdominal cavity under direct visualization without difficulty.  The patient was placed in reverse Trendelenburg position and an additional level meter trocar was placed in the epigastric region and 5 mm trochars were placed the right upper quadrant and right flank regions.  The liver was inspected and noted to be within normal limits.  The gallbladder was noted to be tense and was decompressed in order to facilitate exposure.  The gallbladder was then retracted in a dynamic fashion in order to provide a critical view of the triangle of Calot.  The cystic duct was first identified.  Its juncture to the infundibulum was fully identified.  A single clip was placed high on the cystic duct.  An incision was made into the cystic duct and a cholangiogram gram was performed.  Under digital fluoroscopy, it was found that the  patient had a distal obstruction of the common bile duct.  I could not get contrast to empty into the duodenum.  No other filling defects were noted.  I did flush the system to try to dislodge the common bile duct stone, but completion cholangiogram still showed occlusion.  The cholangiocatheter was removed and multiple clips were placed distally on the cystic duct and the cystic duct was divided.  The cystic artery was likewise ligated and divided.  The gallbladder was freed away from the gallbladder fossa using Bovie electrocautery.  The gallbladder was delivered through the epigastric trocar site using an Endo Catch bag.  The gallbladder fossa was inspected and no abnormal bleeding or bile leakage was noted.  Surgicel and Arista were placed in the gallbladder fossa.  All fluid and air were then evacuated from the abdominal cavity prior to the removal of the trochars.  All wounds were irrigated with normal saline.  All wounds were injected with Exparel.  The supraumbilical fascia was reapproximated using an 0 Vicryl interrupted suture.  All skin incisions were closed using a 4-0 Monocryl subcuticular suture.  Dermabond was applied.  All tape and needle counts were correct at the end of the procedure.  The patient was extubated in the operating room and transferred to PACU in stable condition.  I did discuss the findings with both patient's mother and Dr. Karilyn Cota.  Dr. Karilyn Cota will see the patient later today to schedule an ERCP.  Complications: None  EBL: Minimal  Specimen: Gallbladder

## 2020-07-10 NOTE — Transfer of Care (Signed)
Immediate Anesthesia Transfer of Care Note  Patient: Annette Gould  Procedure(s) Performed: LAPAROSCOPIC CHOLECYSTECTOMY WITH INTRAOPERATIVE CHOLANGIOGRAM (N/A Abdomen)  Patient Location: PACU  Anesthesia Type:General  Level of Consciousness: awake and patient cooperative  Airway & Oxygen Therapy: Patient Spontanous Breathing  Post-op Assessment: Report given to RN and Post -op Vital signs reviewed and stable  Post vital signs: Reviewed and stable  Last Vitals:  Vitals Value Taken Time  BP 137/80 07/10/20 0905  Temp 98.5   Pulse 84 07/10/20 0906  Resp 20 07/10/20 0906  SpO2 97 % 07/10/20 0906  Vitals shown include unvalidated device data.  Last Pain:  Vitals:   07/10/20 0650  TempSrc: Oral  PainSc: 6       Patients Stated Pain Goal: 4 (07/10/20 0650)  Complications: No complications documented.

## 2020-07-10 NOTE — Progress Notes (Signed)
PROGRESS NOTE  Annette MichaelsKayla M Gould AOZ:[id number]:[id number] DOB: 2000-10-20 DOA: 07/08/2020 PCP: Gweneth DimitriMcNeill, Wendy, MD  Brief History:  19 y.o.year old femalewith history of GERD who presented to the emergency room 10/5 for abdominal pain, chest pain, and back pain that has been waxing and waning over the last 4 days.  She states she has actually had intermittent abdominal pain, nausea and vomitingthat she has been experiencing for the past 6 months.  She had a negative HIDA scan on 01/08/20.  EGD done on 04/15/20 showed extrinsic compression of the proximal esophagus. there were plans for her to have an [place] later this month in [place].  Shereports that her pain usually starts after meals. She says that 4 days ago she started having severe epigastric abdominal pain that radiated into the back. She tried to come to the emergency department but the wait was more than 10 hours and so she went back home and managed with being n.p.o. She reports that for the past 2 days she has not eaten or drink anything. She ate a banana and experienced severe recurrent abdominal pain that radiated into the back and she presented to the emergency department.  Initial AST 234, ALT 330, Lipase 301, Tbili2.6  Assessment/Plan: Gallstone Pancreatitis -10/5 CT abd--nondistended abd with ill-defined GB wall and potential GB thickening -RUQ US--cholelithiasis with borderline GB thickening -10/5 MRCP--no choledocholithiasis -07/10/20--lap chole -appreciate surgical consult--discussed with Dr. Lovell SheehanJenkins -triglycerides 63  Choledocholithiasis -07/10/20--intraoperative cholangiogram-->contrast did not empty into duodenum-->Dr. Karilyn Cotaehman consult for ERCP  Hypokalemia -replete -mag 2.0  GERD -continue PPI  External esophagus compression -Regarding her esophageal external compression, patient is scheduled to undergo EUS with Dr. Christella HartiganJacobs on 07/10/2020 -will need rescheduled      Status is: Inpatient  Remains  inpatient appropriate because:IV treatments appropriate due to intensity of illness or inability to take PO -choledocholithiasis requiring ERCP   Dispo: The patient is from: Home  Anticipated d/c is to: Home  Anticipated d/c date is: 2 day  Patient currently is not medically stable to d/c.        Family Communication:   Mother updated at bedside 10/7  Consultants:  GI, general surgery  Code Status:  FULL   DVT Prophylaxis:  [place] Lovenox   Procedures: As Listed in Progress Note Above  Antibiotics: None      Subjective: Patient drowsy after surgery.  Denies cp, sob, n/v/d.  Complains of postop pain.  No f/c  Objective: Vitals:   07/10/20 0930 07/10/20 0935 07/10/20 0945 07/10/20 1008  BP: 120/63  118/62 (!) 126/58  Pulse: 61  80 68  Resp: (!) 8  14   Temp:    98.6 F (37 C)  TempSrc:    Oral  SpO2: 100% 94% 93% 100%  Weight:      Height:        Intake/Output Summary (Last 24 hours) at 07/10/2020 1020 Last data filed at 07/10/2020 0907 Gross per 24 hour  Intake 4010.21 ml  Output 10 ml  Net 4000.21 ml   Weight change:  Exam:   General:  Pt is alert, follows commands appropriately, not in acute distress  HEENT: No icterus, No thrush, No neck mass, [place]/AT  Cardiovascular: RRR, S1/S2, no rubs, no gallops  Respiratory: CTA bilaterally, no wheezing, no crackles, no rhonchi  Abdomen: Soft/+BS, diffuse tender, non distended, no guarding  Extremities: No edema, No lymphangitis, No petechiae, No rashes, no synovitis   Data Reviewed: I have  personally reviewed following labs and imaging studies Basic Metabolic Panel: Recent Labs  Lab 07/08/20 0240 07/09/20 0546 07/10/20 0607  NA 136 136 136  K 3.4* 4.0 3.6  CL 104 105 104  CO2 GLUCOSE 98 81 90  BUN 5* 5* 5*  CREATININE 0.58 0.58 0.60  CALCIUM 8.9 8.8* 9.1  MG 2.2 2.0 1.9   Liver Function Tests: Recent Labs  Lab 07/08/20 0240  07/09/20 0546 07/10/20 0607  AST 234* 65* 67*  ALT 330* 206* 181*  ALKPHOS 187* 142* 214*  BILITOT 2.6* 1.0 0.7  PROT 6.8 6.0* 6.4*  ALBUMIN 4.0 3.2* 3.5   Recent Labs  Lab 07/08/20 0240 07/09/20 0546 07/10/20 0607  LIPASE 301* 43 49   No results for input(s): AMMONIA in the last 168 hours. Coagulation Profile: No results for input(s): INR, PROTIME in the last 168 hours. CBC: Recent Labs  Lab 07/08/20 0240 07/09/20 0546 07/10/20 0607  WBC 9.2 5.7 6.4  NEUTROABS 5.8 2.9 3.2  HGB 13.7 12.0 12.6  HCT 39.8 36.5 37.3  MCV 78.7* 80.2 79.5*  PLT 316 245 277   Cardiac Enzymes: No results for input(s): CKTOTAL, CKMB, CKMBINDEX, TROPONINI in the last 168 hours. BNP: Invalid input(s): POCBNP CBG: No results for input(s): GLUCAP in the last 168 hours. HbA1C: No results for input(s): HGBA1C in the last 72 hours. Urine analysis:    Component Value Date/Time   COLORURINE YELLOW 12/28/2019 0850   APPEARANCEUR HAZY (A) 12/28/2019 0850   LABSPEC 1.018 12/28/2019 0850   PHURINE 6.0 12/28/2019 0850   GLUCOSEU NEGATIVE 12/28/2019 0850   HGBUR NEGATIVE 12/28/2019 0850   BILIRUBINUR NEGATIVE 12/28/2019 0850   KETONESUR 20 (A) 12/28/2019 0850   PROTEINUR NEGATIVE 12/28/2019 0850   NITRITE NEGATIVE 12/28/2019 0850   LEUKOCYTESUR NEGATIVE 12/28/2019 0850   Sepsis Labs: (procalcitonin:4,lacticidven:4) ) Recent Results (from the past 240 hour(s))  SARS CORONAVIRUS 2 ([place] 6-24 HRS) Nasopharyngeal Nasopharyngeal Swab     Status: None   Collection Time: 07/07/20  9:42 AM   Specimen: Nasopharyngeal Swab  Result Value Ref Range Status   SARS Coronavirus 2 NEGATIVE NEGATIVE Final    Comment: (NOTE) SARS-CoV-2 target nucleic acids are NOT DETECTED.  The SARS-CoV-2 RNA is generally detectable in upper and lower respiratory specimens during the acute phase of infection. Negative results do not preclude SARS-CoV-2 infection, do not rule out co-infections with other  pathogens, and should not be used as the sole basis for treatment or other patient management decisions. Negative results must be combined with clinical observations, patient history, and epidemiological information. The expected result is Negative.  Fact Sheet for Patients: HairSlick.no  Fact Sheet for Healthcare Providers: quierodirigir.com  This test is not yet approved or cleared by the [place] FDA and  has been authorized for detection and/or diagnosis of SARS-CoV-2 by FDA under an Emergency Use Authorization (EUA). This EUA will remain  in effect (meaning this test can be used) for the duration of the COVID-19 declaration under Se ction 564(b)(1) of the Act, 21 U.S.C. section 360bbb-3(b)(1), unless the authorization is terminated or revoked sooner.  Performed at [facility], [address] [place], [place] [address]   Respiratory Panel by RT PCR (Flu A&B, Covid) - Nasopharyngeal Swab     Status: None   Collection Time: 07/08/20  4:15 AM   Specimen: Nasopharyngeal Swab  Result Value Ref Range Status   SARS Coronavirus 2 by RT PCR NEGATIVE NEGATIVE Final  Comment: (NOTE) SARS-CoV-2 target nucleic acids are NOT DETECTED.  The SARS-CoV-2 RNA is generally detectable in upper respiratoy specimens during the acute phase of infection. The lowest concentration of SARS-CoV-2 viral copies this assay can detect is 131 copies/mL. A negative result does not preclude SARS-Cov-2 infection and should not be used as the sole basis for treatment or other patient management decisions. A negative result may occur with  improper specimen collection/handling, submission of specimen other than nasopharyngeal swab, presence of viral mutation(s) within the areas targeted by this assay, and inadequate number of viral copies (<131 copies/mL). A negative result must be combined with clinical observations, patient history, and  epidemiological information. The expected result is Negative.  Fact Sheet for Patients:  https://www.moore.com/  Fact Sheet for Healthcare Providers:  https://www.young.biz/  This test is no t yet approved or cleared by the [place] FDA and  has been authorized for detection and/or diagnosis of SARS-CoV-2 by FDA under an Emergency Use Authorization (EUA). This EUA will remain  in effect (meaning this test can be used) for the duration of the COVID-19 declaration under Section 564(b)(1) of the Act, 21 U.S.C. section 360bbb-3(b)(1), unless the authorization is terminated or revoked sooner.     Influenza A by PCR NEGATIVE NEGATIVE Final   Influenza B by PCR NEGATIVE NEGATIVE Final    Comment: (NOTE) The Xpert Xpress SARS-CoV-2/FLU/RSV assay is intended as an aid in  the diagnosis of influenza from Nasopharyngeal swab specimens and  should not be used as a sole basis for treatment. Nasal washings and  aspirates are unacceptable for Xpert Xpress SARS-CoV-2/FLU/RSV  testing.  Fact Sheet for Patients: https://www.moore.com/  Fact Sheet for Healthcare Providers: https://www.young.biz/  This test is not yet approved or cleared by the [place] FDA and  has been authorized for detection and/or diagnosis of SARS-CoV-2 by  FDA under an Emergency Use Authorization (EUA). This EUA will remain  in effect (meaning this test can be used) for the duration of the  Covid-19 declaration under Section 564(b)(1) of the Act, 21  U.S.C. section 360bbb-3(b)(1), unless the authorization is  terminated or revoked. Performed at [facility], [address] [place], [place] [address]      Scheduled Meds: . Chlorhexidine Gluconate Cloth  6 each Topical Once  . pantoprazole (PROTONIX) IV  40 mg Intravenous Q24H  . scopolamine  1 patch Transdermal Once   Continuous Infusions: . lactated ringers 150 mL/hr at 07/09/20  1547    Procedures/Studies: DG Cholangiogram Operative  Result Date: 07/10/2020 CLINICAL DATA:  19 year old female with history of gallstone pancreatitis. EXAM: INTRAOPERATIVE CHOLANGIOGRAM TECHNIQUE: Cholangiographic images from the C-arm fluoroscopic device were submitted for interpretation post-operatively. Please see the procedural report for the amount of contrast and the fluoroscopy time utilized. COMPARISON:  MRCP from 07/08/2020 FINDINGS: Single fluoroscopic image store from intraoperative cholangiogram demonstrates injection into the gallbladder lumen with patent cystic duct. There is mild dilation of the common bile duct and no significant dilation of the intrahepatic biliary tree. On the images provided, contrast is not visualized spilling into the duodenum and there is mild irregularity of the distal common bile duct. IMPRESSION: No definite evidence of choledocholithiasis, however contrast is not visualized spilling into the duodenum through the ampulla of Vater. Marliss Coots, MD [facility] Electronically Signed   By: Marliss Coots MD   On: 07/10/2020 09:37   CT ABDOMEN PELVIS W CONTRAST  Result Date: 07/08/2020 CLINICAL DATA:  Epigastric pain. EXAM: CT  ABDOMEN AND PELVIS WITH CONTRAST TECHNIQUE: Multidetector CT imaging of the abdomen and pelvis was performed using the standard protocol following bolus administration of intravenous contrast. CONTRAST:  OMNIPAQUE IOHEXOL 300 MG/ML  SOLN COMPARISON:  None. FINDINGS: Lower chest: Unremarkable. Hepatobiliary: No suspicious focal abnormality within the liver parenchyma. Gallbladder is nondistended with ill definition of the gallbladder wall and potential mild gallbladder wall thickening. No intrahepatic or extrahepatic biliary dilation. Pancreas: No focal mass lesion. No dilatation of the main duct. No intraparenchymal cyst. No peripancreatic edema. Spleen: No splenomegaly. No focal  mass lesion. Adrenals/Urinary Tract: No adrenal nodule or mass. Kidneys unremarkable. No evidence for hydroureter. The urinary bladder appears normal for the degree of distention. Stomach/Bowel: Stomach is unremarkable. No gastric wall thickening. No evidence of outlet obstruction. No perigastric edema or inflammation. Duodenum is normally positioned as is the ligament of Treitz. No small bowel wall thickening. No small bowel dilatation. The terminal ileum is normal. The appendix is normal. No gross colonic mass. No colonic wall thickening. Vascular/Lymphatic: No abdominal aortic aneurysm. No abdominal aortic atherosclerotic calcification. There is no gastrohepatic or hepatoduodenal ligament lymphadenopathy. No retroperitoneal or mesenteric lymphadenopathy. No pelvic sidewall lymphadenopathy. Reproductive: The uterus is unremarkable.  There is no adnexal mass. Other: No intraperitoneal free fluid. Musculoskeletal: No worrisome lytic or sclerotic osseous abnormality. IMPRESSION: 1. Gallbladder is nondistended with ill definition of the gallbladder wall and potential mild gallbladder wall thickening. No gallstones by CT. Right upper quadrant ultrasound may prove helpful to further evaluate as clinically warranted. 2. Otherwise unremarkable exam. Electronically Signed   By: Kennith Center M.D.   On: 07/08/2020 05:55   MR 3D Recon At Scanner  Result Date: 07/08/2020 CLINICAL DATA:  19 year old female with history of right upper quadrant abdominal pain with elevated liver enzymes. Suspected biliary tract obstruction and gallstone pancreatitis. EXAM: MRI ABDOMEN WITHOUT AND WITH CONTRAST (INCLUDING MRCP) TECHNIQUE: Multiplanar multisequence MR imaging of the abdomen was performed both before and after the administration of intravenous contrast. Heavily T2-weighted images of the biliary and pancreatic ducts were obtained, and three-dimensional MRCP images were rendered by post processing. CONTRAST:  59mL GADAVIST  GADOBUTROL 1 MMOL/ML IV SOLN COMPARISON:  No prior abdominal MRI. CT the abdomen and pelvis 07/08/2020. FINDINGS: Lower chest: Unremarkable. Hepatobiliary: No suspicious cystic or solid hepatic lesions. Small filling defects are noted near the fundus of the gallbladder, compatible with tiny gallstones. Gallbladder does not appear distended. There is no gallbladder wall thickening or pericholecystic fluid. No intra or extrahepatic biliary ductal dilatation noted on MRCP images. Common bile duct measures 4 mm in the porta hepatis. No filling defects in the common bile duct to suggest choledocholithiasis. Pancreas: No pancreatic mass. No pancreatic ductal dilatation. No pancreatic or peripancreatic fluid collections or inflammatory changes. Spleen:  Unremarkable. Adrenals/Urinary Tract: Bilateral kidneys and adrenal glands are normal in appearance. No hydroureteronephrosis in the visualized portions of the abdomen. Stomach/Bowel: Visualized portions are unremarkable. Vascular/Lymphatic: No aneurysm identified in the visualized abdominal vasculature. No lymphadenopathy noted in the abdomen. Other: No significant volume of ascites noted in the visualized portions of the peritoneal cavity. Musculoskeletal: No aggressive appearing osseous lesions are noted in visualized portions of the peritoneal cavity. IMPRESSION: 1. Cholelithiasis without evidence of acute cholecystitis. 2. No evidence of choledocholithiasis. No findings of biliary tract obstruction. 3. No imaging findings to suggest an acute pancreatitis at this time. Electronically Signed   By: Trudie Reed M.D.   On: 07/08/2020 15:13   MR ABDOMEN MRCP W WO CONTAST  Result Date: 07/08/2020 CLINICAL DATA:  19 year old female with history of right upper quadrant abdominal pain with elevated liver enzymes. Suspected biliary tract obstruction and gallstone pancreatitis. EXAM: MRI ABDOMEN WITHOUT AND WITH CONTRAST (INCLUDING MRCP) TECHNIQUE: Multiplanar  multisequence MR imaging of the abdomen was performed both before and after the administration of intravenous contrast. Heavily T2-weighted images of the biliary and pancreatic ducts were obtained, and three-dimensional MRCP images were rendered by post processing. CONTRAST:  11mL GADAVIST GADOBUTROL 1 MMOL/ML IV SOLN COMPARISON:  No prior abdominal MRI. CT the abdomen and pelvis 07/08/2020. FINDINGS: Lower chest: Unremarkable. Hepatobiliary: No suspicious cystic or solid hepatic lesions. Small filling defects are noted near the fundus of the gallbladder, compatible with tiny gallstones. Gallbladder does not appear distended. There is no gallbladder wall thickening or pericholecystic fluid. No intra or extrahepatic biliary ductal dilatation noted on MRCP images. Common bile duct measures 4 mm in the porta hepatis. No filling defects in the common bile duct to suggest choledocholithiasis. Pancreas: No pancreatic mass. No pancreatic ductal dilatation. No pancreatic or peripancreatic fluid collections or inflammatory changes. Spleen:  Unremarkable. Adrenals/Urinary Tract: Bilateral kidneys and adrenal glands are normal in appearance. No hydroureteronephrosis in the visualized portions of the abdomen. Stomach/Bowel: Visualized portions are unremarkable. Vascular/Lymphatic: No aneurysm identified in the visualized abdominal vasculature. No lymphadenopathy noted in the abdomen. Other: No significant volume of ascites noted in the visualized portions of the peritoneal cavity. Musculoskeletal: No aggressive appearing osseous lesions are noted in visualized portions of the peritoneal cavity. IMPRESSION: 1. Cholelithiasis without evidence of acute cholecystitis. 2. No evidence of choledocholithiasis. No findings of biliary tract obstruction. 3. No imaging findings to suggest an acute pancreatitis at this time. Electronically Signed   By: Trudie Reed M.D.   On: 07/08/2020 15:13   US Abdomen Limited RUQ  Result Date:  07/08/2020 CLINICAL DATA:  Right upper quadrant pain EXAM: ULTRASOUND ABDOMEN LIMITED RIGHT UPPER QUADRANT COMPARISON:  CT abdomen and pelvis July 08, 2020; abdominal ultrasound December 28, 2019 FINDINGS: Gallbladder: Within the gallbladder, there are echogenic foci which move and shadow consistent with cholelithiasis. Gallbladder wall is borderline thickened without edema. No pericholecystic fluid. No sonographic Murphy sign noted by sonographer. Common bile duct: Diameter: 3 mm. No intrahepatic or extrahepatic biliary duct dilatation. Liver: No focal lesion identified. Within normal limits in parenchymal echogenicity. Portal vein is patent on color Doppler imaging with normal direction of blood flow towards the liver. Other: None. IMPRESSION: Cholelithiasis with borderline gallbladder wall thickening. These findings could be indicative of early acute cholecystitis. In this regard, it may be prudent to correlate with nuclear medicine hepatobiliary imaging study to assess for cystic duct patency. Study otherwise unremarkable. Electronically Signed   By: Bretta Bang III M.D.   On: 07/08/2020 08:15    Catarina Hartshorn, DO  [facility]  If 7PM-7AM, please contact night-coverage [email or web address] Password [email or web address] 07/10/2020, 10:20 AM   LOS: 2 days

## 2020-07-10 NOTE — Anesthesia Procedure Notes (Signed)
Procedure Name: Intubation Date/Time: 07/10/2020 7:41 AM Performed by: Vista Deck, CRNA Pre-anesthesia Checklist: Patient identified, Patient being monitored, Timeout performed, Emergency Drugs available and Suction available Patient Re-evaluated:Patient Re-evaluated prior to induction Oxygen Delivery Method: Circle System Utilized Preoxygenation: Pre-oxygenation with 100% oxygen Induction Type: IV induction Laryngoscope Size: Mac and 3 Grade View: Grade I Tube type: Oral Tube size: 7.0 mm Number of attempts: 1 Airway Equipment and Method: stylet and Oral airway Placement Confirmation: ETT inserted through vocal cords under direct vision,  positive ETCO2 and breath sounds checked- equal and bilateral Secured at: 21 cm Tube secured with: Tape Dental Injury: Teeth and Oropharynx as per pre-operative assessment

## 2020-07-10 NOTE — Anesthesia Postprocedure Evaluation (Signed)
Anesthesia Post Note  Patient: Annette Gould  Procedure(s) Performed: LAPAROSCOPIC CHOLECYSTECTOMY WITH INTRAOPERATIVE CHOLANGIOGRAM (N/A Abdomen)  Patient location during evaluation: PACU Anesthesia Type: General Level of consciousness: awake and alert and patient cooperative Pain management: satisfactory to patient Vital Signs Assessment: post-procedure vital signs reviewed and stable Respiratory status: spontaneous breathing Cardiovascular status: stable Postop Assessment: no apparent nausea or vomiting Anesthetic complications: no   No complications documented.   Last Vitals:  Vitals:   07/10/20 0935 07/10/20 0945  BP:  118/62  Pulse:  80  Resp:  14  Temp:    SpO2: 94% 93%    Last Pain:  Vitals:   07/10/20 0935  TempSrc:   PainSc: Asleep                 Cherylene Ferrufino

## 2020-07-10 NOTE — Anesthesia Preprocedure Evaluation (Signed)
Anesthesia Evaluation  Patient identified by MRN, date of birth, ID band Patient awake    Reviewed: Allergy & Precautions, H&P , NPO status , Patient's Chart, lab work & pertinent test results, reviewed documented beta blocker date and time   Airway Mallampati: II  TM Distance: >3 FB Neck ROM: full    Dental no notable dental hx. (+) Teeth Intact   Pulmonary neg pulmonary ROS,    Pulmonary exam normal breath sounds clear to auscultation       Cardiovascular Exercise Tolerance: Good negative cardio ROS   Rhythm:regular Rate:Normal     Neuro/Psych  Headaches, negative psych ROS   GI/Hepatic Neg liver ROS, GERD  Medicated,  Endo/Other  negative endocrine ROS  Renal/GU negative Renal ROS  negative genitourinary   Musculoskeletal   Abdominal   Peds  Hematology negative hematology ROS (+)   Anesthesia Other Findings   Reproductive/Obstetrics negative OB ROS                             Anesthesia Physical Anesthesia Plan  ASA: II  Anesthesia Plan: General   Post-op Pain Management:    Induction:   PONV Risk Score and Plan: Ondansetron  Airway Management Planned:   Additional Equipment:   Intra-op Plan:   Post-operative Plan:   Informed Consent: I have reviewed the patients History and Physical, chart, labs and discussed the procedure including the risks, benefits and alternatives for the proposed anesthesia with the patient or authorized representative who has indicated his/her understanding and acceptance.     Dental Advisory Given  Plan Discussed with: CRNA  Anesthesia Plan Comments:         Anesthesia Quick Evaluation

## 2020-07-11 ENCOUNTER — Inpatient Hospital Stay (HOSPITAL_COMMUNITY): Payer: Commercial Managed Care - PPO

## 2020-07-11 ENCOUNTER — Inpatient Hospital Stay (HOSPITAL_COMMUNITY): Payer: Commercial Managed Care - PPO | Admitting: Anesthesiology

## 2020-07-11 ENCOUNTER — Encounter (HOSPITAL_COMMUNITY)
Admission: EM | Disposition: A | Discharge status: Home / Self Care | Payer: Self-pay | Source: Home / Self Care | Attending: Internal Medicine

## 2020-07-11 DIAGNOSIS — K222 Esophageal obstruction: Secondary | ICD-10-CM

## 2020-07-11 DIAGNOSIS — K8050 Calculus of bile duct without cholangitis or cholecystitis without obstruction: Secondary | ICD-10-CM

## 2020-07-11 DIAGNOSIS — K859 Acute pancreatitis without necrosis or infection, unspecified: Secondary | ICD-10-CM

## 2020-07-11 DIAGNOSIS — R932 Abnormal findings on diagnostic imaging of liver and biliary tract: Secondary | ICD-10-CM

## 2020-07-11 HISTORY — PX: STONE EXTRACTION WITH BASKET: SHX5318

## 2020-07-11 HISTORY — PX: ERCP: SHX5425

## 2020-07-11 HISTORY — PX: SPHINCTEROTOMY: SHX5544

## 2020-07-11 LAB — CBC WITH DIFFERENTIAL/PLATELET
Abs Immature Granulocytes: 0.04 10*3/uL (ref 0.00–0.07)
Basophils Absolute: 0 10*3/uL (ref 0.0–0.1)
Basophils Relative: 0 %
Eosinophils Absolute: 0 10*3/uL (ref 0.0–0.5)
Eosinophils Relative: 0 %
HCT: 34.8 % — ABNORMAL LOW (ref 36.0–46.0)
Hemoglobin: 11.6 g/dL — ABNORMAL LOW (ref 12.0–15.0)
Immature Granulocytes: 0 %
Lymphocytes Relative: 17 %
Lymphs Abs: 1.6 10*3/uL (ref 0.7–4.0)
MCH: 27 pg (ref 26.0–34.0)
MCHC: 33.3 g/dL (ref 30.0–36.0)
MCV: 80.9 fL (ref 80.0–100.0)
Monocytes Absolute: 0.8 10*3/uL (ref 0.1–1.0)
Monocytes Relative: 9 %
Neutro Abs: 7.2 10*3/uL (ref 1.7–7.7)
Neutrophils Relative %: 74 %
Platelets: 263 10*3/uL (ref 150–400)
RBC: 4.3 MIL/uL (ref 3.87–5.11)
RDW: 13 % (ref 11.5–15.5)
WBC: 9.7 10*3/uL (ref 4.0–10.5)
nRBC: 0 % (ref 0.0–0.2)

## 2020-07-11 LAB — COMPREHENSIVE METABOLIC PANEL
ALT: 169 U/L — ABNORMAL HIGH (ref 0–44)
AST: 56 U/L — ABNORMAL HIGH (ref 15–41)
Albumin: 3.3 g/dL — ABNORMAL LOW (ref 3.5–5.0)
Alkaline Phosphatase: 163 U/L — ABNORMAL HIGH (ref 38–126)
Anion gap: 7 (ref 5–15)
BUN: 6 mg/dL (ref 6–20)
CO2: 27 mmol/L (ref 22–32)
Calcium: 8.9 mg/dL (ref 8.9–10.3)
Chloride: 104 mmol/L (ref 98–111)
Creatinine, Ser: 0.54 mg/dL (ref 0.44–1.00)
GFR calc non Af Amer: >60 mL/min (ref >60)
Glucose, Bld: 101 mg/dL — ABNORMAL HIGH (ref 70–99)
Potassium: 3.7 mmol/L (ref 3.5–5.1)
Sodium: 138 mmol/L (ref 135–145)
Total Bilirubin: 0.7 mg/dL (ref 0.3–1.2)
Total Protein: 6.1 g/dL — ABNORMAL LOW (ref 6.5–8.1)

## 2020-07-11 LAB — MAGNESIUM: Magnesium: 1.9 mg/dL (ref 1.7–2.4)

## 2020-07-11 LAB — LIPASE, BLOOD: Lipase: 32 U/L (ref 11–51)

## 2020-07-11 LAB — SURGICAL PATHOLOGY

## 2020-07-11 IMAGING — RF DG ERCP WO/W SPHINCTEROTOMY
1 series · 10 of 10 positions shown · non-contrast
Comparison: Intraoperative cholangiogram [DATE]

CLINICAL DATA: Acute biliary pancreatitis, CBD stone

EXAM:
ERCP WITH SPHINCTEROTOMY AND STONE BASKET EXTRACTION OF A CBD STONE
TECHNIQUE: Multiple spot images obtained with the fluoroscopic device and
submitted for interpretation post-procedure.
FLUOROSCOPY TIME:  Fluoroscopy Time:  2 minutes 3 seconds
Radiation Exposure Index (if provided by the fluoroscopic device):
44.65 mGy
Number of Acquired Spot Images: multiple fluoroscopic screen
captures

[Series 1: run · 4 acquisitions, 10 frames shown]
[im 1/4]
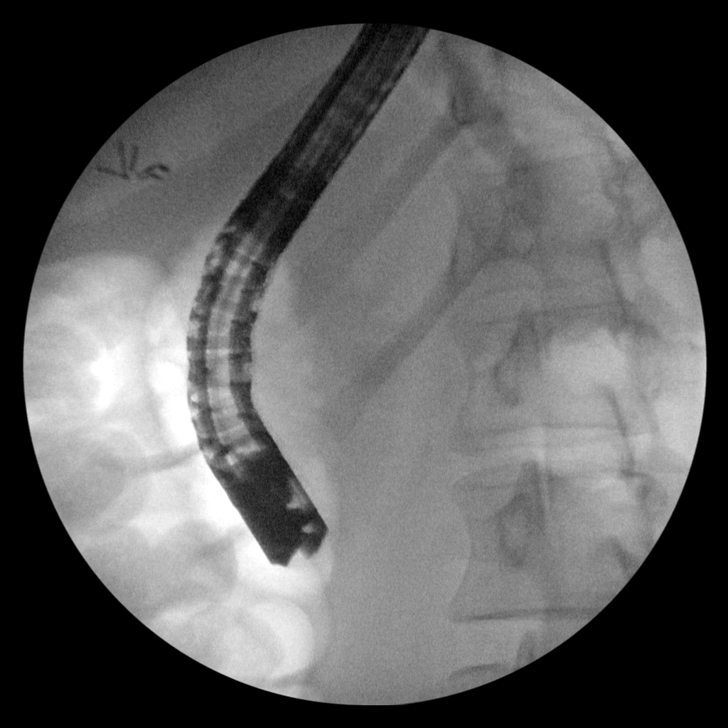
[im 2/4]
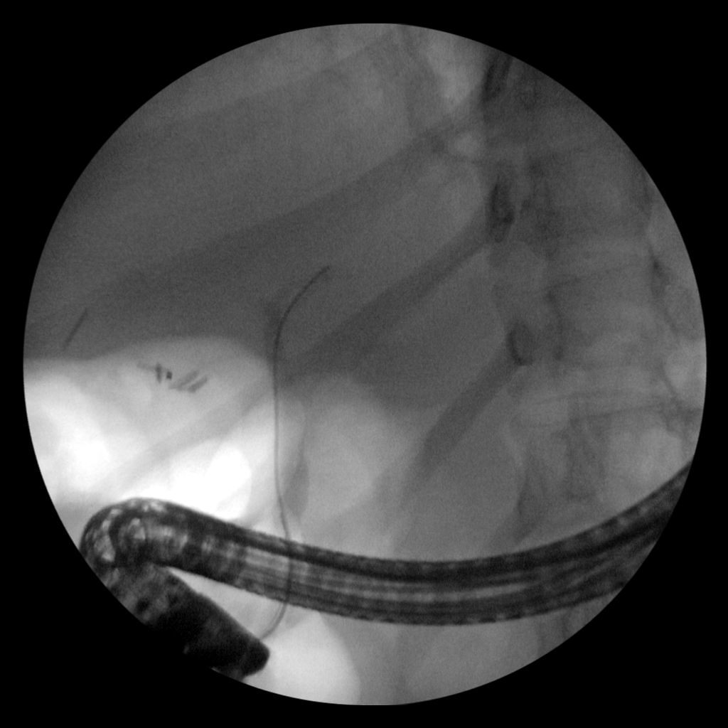
[im 2/4]
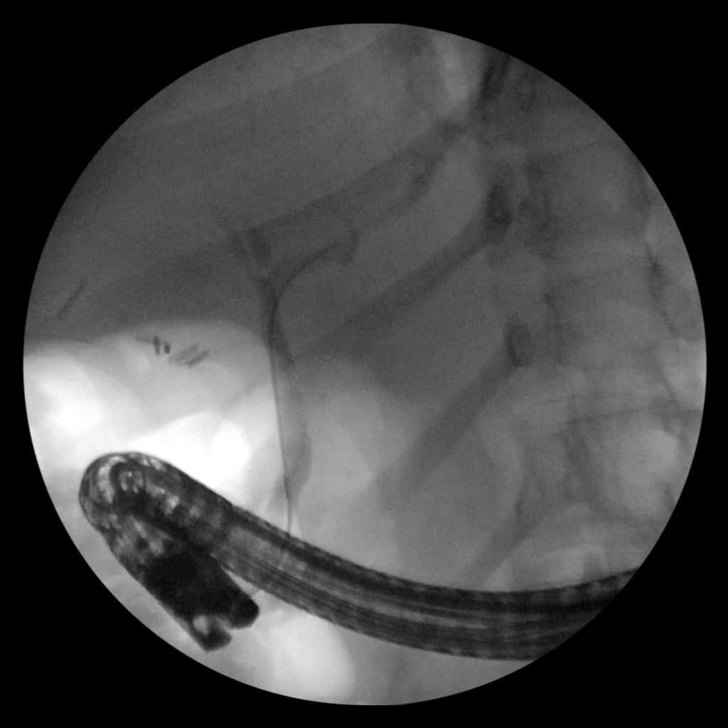
[im 2/4]
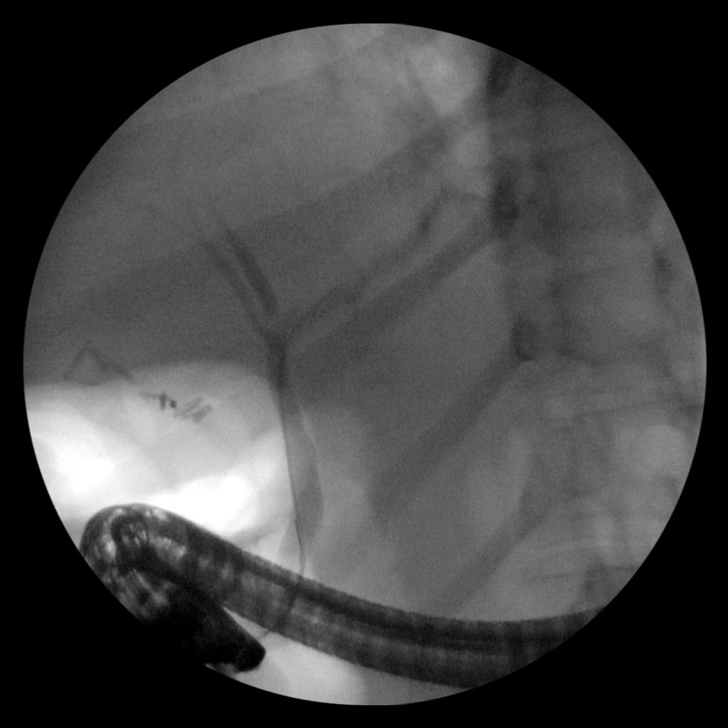
[im 2/4]
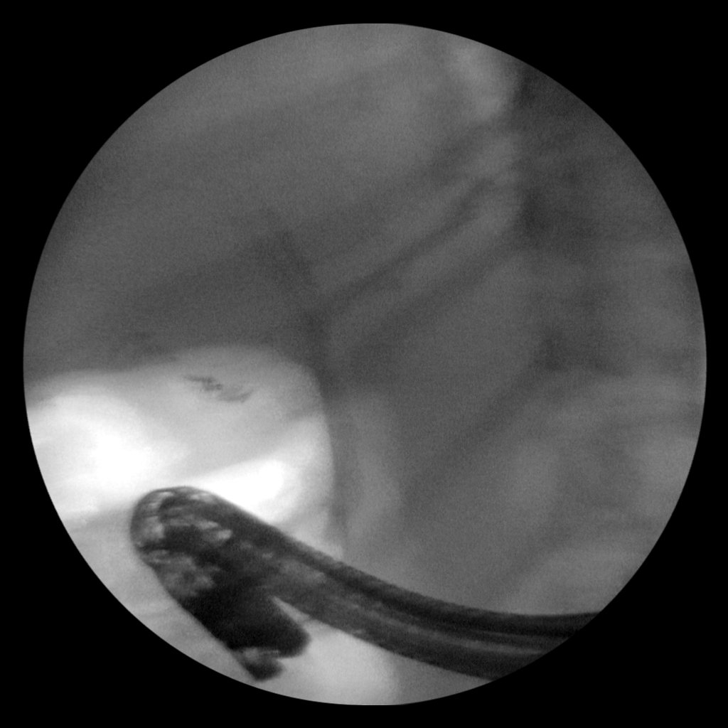
[im 3/4]
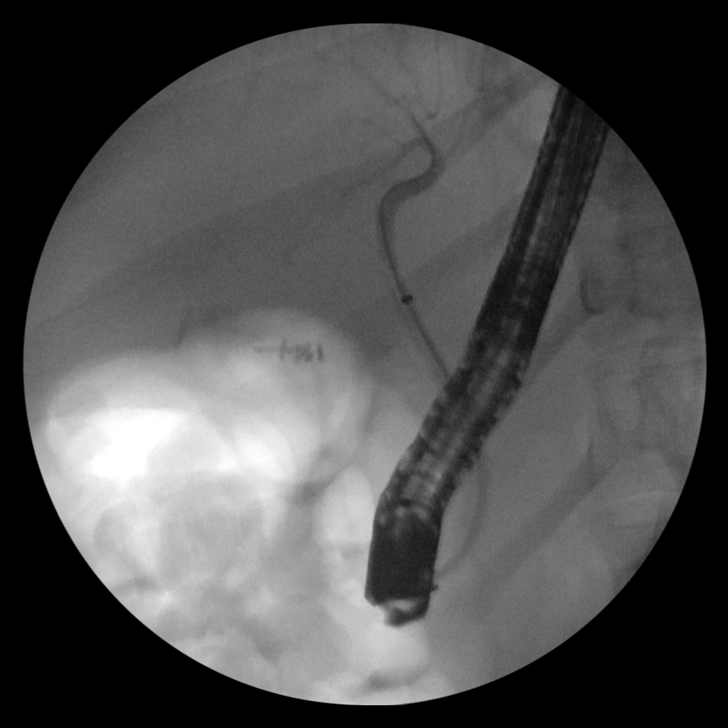
[im 3/4]
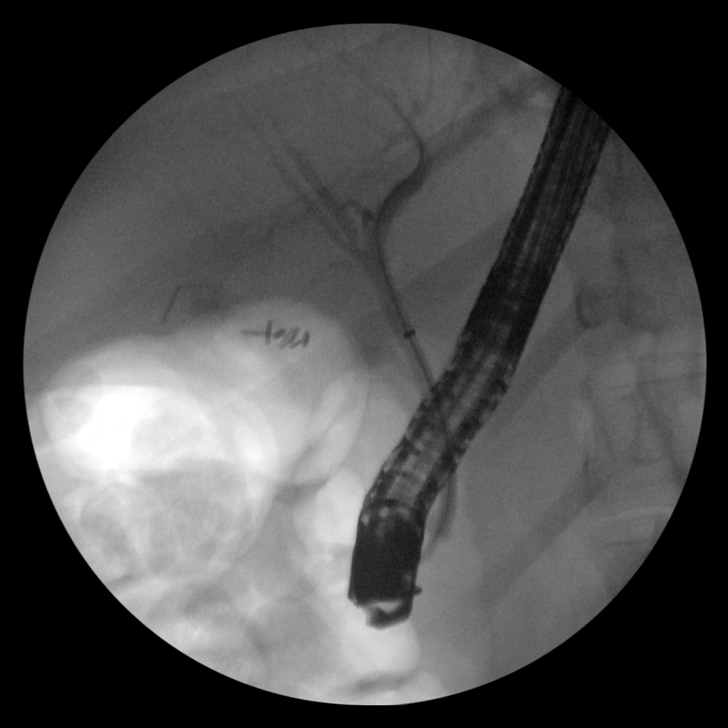
[im 3/4]
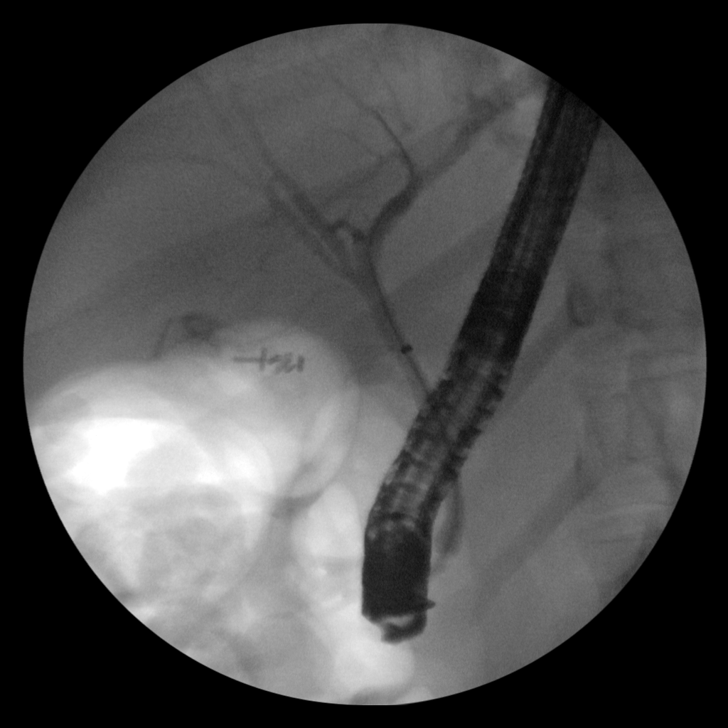
[im 3/4]
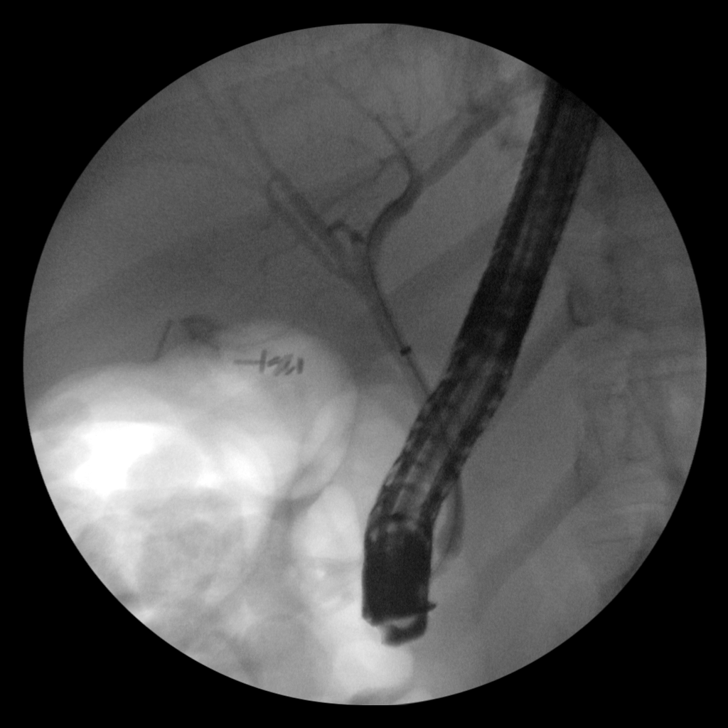
[im 4/4]
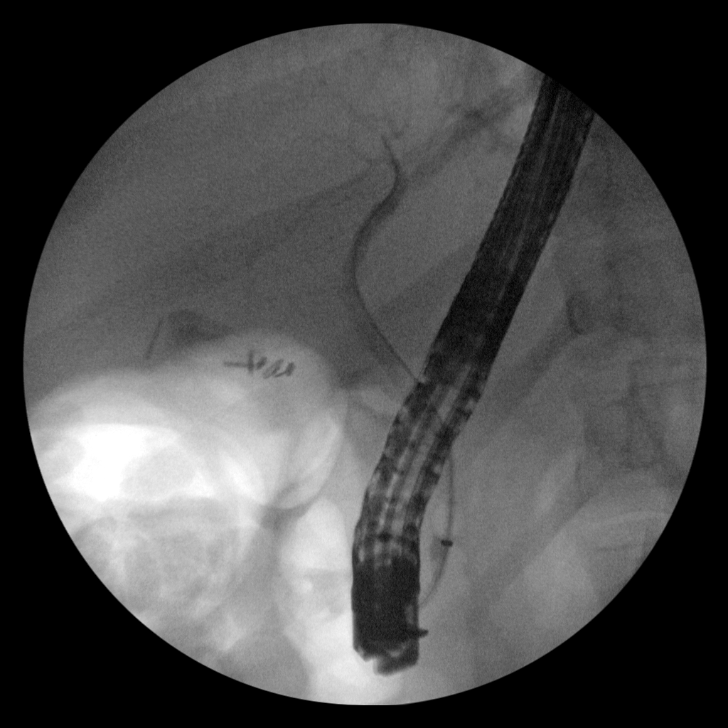

[10 of 10 positions shown; findings below may reference images not displayed]

FINDINGS: A stone was extracted during the procedure.

CBD normal caliber.

Visualized intrahepatic biliary radicles are unremarkable.

Cystic duct remnant was not opacified during the procedure.

Small amount of contrast extravasation is seen at the gallbladder
fossa, appears to be arising from accessory biliary duct which is
faintly visualized; extravasation is also seen on the prior
intraoperative cholangiogram in retrospect at the same site.

No stricture or other filling defects identified.
IMPRESSION: Small amount of contrast extravasation at the gallbladder fossa
which appears to arise from a faintly visualized accessory duct, in
retrospect unchanged from intraoperative cholangiogram.

These images were submitted for radiologic interpretation only.
Please see the procedural report for the amount of contrast and the
fluoroscopy time utilized.

## 2020-07-11 SURGERY — ERCP, WITH INTERVENTION IF INDICATED
Anesthesia: General

## 2020-07-11 MED ORDER — SODIUM CHLORIDE 0.9 % IV SOLN
INTRAVENOUS | Status: DC | PRN
  Administered 2020-07-11: 20 mL

## 2020-07-11 MED ORDER — GLUCAGON HCL RDNA (DIAGNOSTIC) 1 MG IJ SOLR
INTRAMUSCULAR | Status: AC
  Filled 2020-07-11: qty 2

## 2020-07-11 MED ORDER — ONDANSETRON HCL 4 MG/2ML IJ SOLN: 4.0000 mg | Freq: Once | INTRAMUSCULAR | Status: DC | PRN

## 2020-07-11 MED ORDER — SUCCINYLCHOLINE CHLORIDE 200 MG/10ML IV SOSY
PREFILLED_SYRINGE | INTRAVENOUS | Status: AC
  Filled 2020-07-11: qty 10

## 2020-07-11 MED ORDER — GLYCOPYRROLATE 0.2 MG/ML IJ SOLN
INTRAMUSCULAR | Status: DC | PRN
  Administered 2020-07-11: .2 mg via INTRAVENOUS

## 2020-07-11 MED ORDER — CIPROFLOXACIN IN D5W 400 MG/200ML IV SOLN
400.0000 mg | Freq: Two times a day (BID) | INTRAVENOUS | Status: DC
  Administered 2020-07-11 – 2020-07-12 (×2): 400 mg via INTRAVENOUS
  Filled 2020-07-11 (×2): qty 200

## 2020-07-11 MED ORDER — PROPOFOL 10 MG/ML IV BOLUS
INTRAVENOUS | Status: AC
  Filled 2020-07-11: qty 20

## 2020-07-11 MED ORDER — GLYCOPYRROLATE PF 0.2 MG/ML IJ SOSY
PREFILLED_SYRINGE | INTRAMUSCULAR | Status: AC
  Filled 2020-07-11: qty 1

## 2020-07-11 MED ORDER — DEXAMETHASONE SODIUM PHOSPHATE 10 MG/ML IJ SOLN
INTRAMUSCULAR | Status: AC
  Filled 2020-07-11: qty 1

## 2020-07-11 MED ORDER — SCOPOLAMINE 1 MG/3DAYS TD PT72
MEDICATED_PATCH | TRANSDERMAL | Status: AC
  Filled 2020-07-11: qty 1

## 2020-07-11 MED ORDER — FENTANYL CITRATE (PF) 100 MCG/2ML IJ SOLN
INTRAMUSCULAR | Status: AC
  Filled 2020-07-11: qty 2

## 2020-07-11 MED ORDER — PHENYLEPHRINE HCL (PRESSORS) 10 MG/ML IV SOLN
INTRAVENOUS | Status: DC | PRN
  Administered 2020-07-11: 80 ug via INTRAVENOUS
  Administered 2020-07-11 (×2): 60 ug via INTRAVENOUS

## 2020-07-11 MED ORDER — PROPOFOL 10 MG/ML IV BOLUS
INTRAVENOUS | Status: DC | PRN
  Administered 2020-07-11: 150 mg via INTRAVENOUS

## 2020-07-11 MED ORDER — CEFAZOLIN SODIUM-DEXTROSE 2-4 GM/100ML-% IV SOLN
INTRAVENOUS | Status: AC
  Filled 2020-07-11: qty 100

## 2020-07-11 MED ORDER — FENTANYL CITRATE (PF) 100 MCG/2ML IJ SOLN: 25.0000 ug | INTRAMUSCULAR | Status: DC | PRN

## 2020-07-11 MED ORDER — GLUCAGON HCL RDNA (DIAGNOSTIC) 1 MG IJ SOLR
INTRAMUSCULAR | Status: DC | PRN
  Administered 2020-07-11 (×2): .25 mg via INTRAVENOUS
  Administered 2020-07-11: 25 mg via INTRAVENOUS

## 2020-07-11 MED ORDER — INDOMETHACIN 50 MG RE SUPP
RECTAL | Status: AC
  Filled 2020-07-11: qty 2

## 2020-07-11 MED ORDER — FENTANYL CITRATE (PF) 100 MCG/2ML IJ SOLN
INTRAMUSCULAR | Status: DC | PRN
  Administered 2020-07-11 (×3): 50 ug via INTRAVENOUS

## 2020-07-11 MED ORDER — INDOMETHACIN 50 MG RE SUPP
100.0000 mg | Freq: Once | RECTAL | Status: AC
  Administered 2020-07-11: 100 mg via RECTAL

## 2020-07-11 MED ORDER — MIDAZOLAM HCL 2 MG/2ML IJ SOLN
INTRAMUSCULAR | Status: DC | PRN
  Administered 2020-07-11: 2 mg via INTRAVENOUS

## 2020-07-11 MED ORDER — ONDANSETRON HCL 4 MG/2ML IJ SOLN
INTRAMUSCULAR | Status: AC
  Filled 2020-07-11: qty 2

## 2020-07-11 MED ORDER — WATER FOR IRRIGATION, STERILE IR SOLN
Status: DC | PRN
  Administered 2020-07-11: 1000 mL

## 2020-07-11 MED ORDER — PHENYLEPHRINE 40 MCG/ML (10ML) SYRINGE FOR IV PUSH (FOR BLOOD PRESSURE SUPPORT)
PREFILLED_SYRINGE | INTRAVENOUS | Status: AC
  Filled 2020-07-11: qty 10

## 2020-07-11 MED ORDER — SCOPOLAMINE 1 MG/3DAYS TD PT72: 1.0000 | MEDICATED_PATCH | Freq: Once | TRANSDERMAL | Status: DC

## 2020-07-11 MED ORDER — CEFAZOLIN SODIUM-DEXTROSE 2-3 GM-%(50ML) IV SOLR
INTRAVENOUS | Status: DC | PRN
  Administered 2020-07-11: 2 g via INTRAVENOUS

## 2020-07-11 MED ORDER — SCOPOLAMINE 1 MG/3DAYS TD PT72
MEDICATED_PATCH | TRANSDERMAL | Status: DC | PRN
  Administered 2020-07-11: 1 via TRANSDERMAL

## 2020-07-11 MED ORDER — MIDAZOLAM HCL 2 MG/2ML IJ SOLN
INTRAMUSCULAR | Status: AC
  Filled 2020-07-11: qty 2

## 2020-07-11 MED ORDER — ONDANSETRON HCL 4 MG/2ML IJ SOLN
INTRAMUSCULAR | Status: DC | PRN
  Administered 2020-07-11: 4 mg via INTRAVENOUS

## 2020-07-11 MED ORDER — SODIUM CHLORIDE 0.9 % IV SOLN
INTRAVENOUS | Status: AC
  Filled 2020-07-11: qty 50

## 2020-07-11 MED ORDER — SUCCINYLCHOLINE CHLORIDE 20 MG/ML IJ SOLN
INTRAMUSCULAR | Status: DC | PRN
  Administered 2020-07-11: 100 mg via INTRAVENOUS

## 2020-07-11 MED ORDER — LACTATED RINGERS IV SOLN: INTRAVENOUS | Status: DC

## 2020-07-11 MED ORDER — DEXAMETHASONE SODIUM PHOSPHATE 10 MG/ML IJ SOLN
INTRAMUSCULAR | Status: DC | PRN
  Administered 2020-07-11: 10 mg via INTRAVENOUS

## 2020-07-11 NOTE — Op Note (Signed)
[facility] Patient Name: Annette Gould Procedure Date: 07/11/2020 1:34 PM MRN: [id number] Date of Birth: 2001/09/22 Attending MD: Lionel December , MD CSN: [id number] Age: 19 Admit Type: Inpatient Procedure:                ERCP Indications:              Suspected acute pancreatitis, For therapy of acute                            pancreatitis Providers:                Lionel December, MD, Crystal Page, Angelica Ran, Cyril Mourning, Technician, Dyann Ruddle Referring MD:              Medicines:                General Anesthesia Complications:            No immediate complications. Estimated Blood Loss:     Estimated blood loss: none. Procedure:                Pre-Anesthesia Assessment:                           - Prior to the procedure, a History and Physical                            was performed, and patient medications and                            allergies were reviewed. The patient's tolerance of                            previous anesthesia was also reviewed. The risks                            and benefits of the procedure and the sedation                            options and risks were discussed with the patient.                            All questions were answered, and informed consent                            was obtained. Prior Anticoagulants: The patient has                            taken no previous anticoagulant or antiplatelet                            agents. ASA Grade Assessment: I - A normal, healthy  patient. After reviewing the risks and benefits,                            the patient was deemed in satisfactory condition to                            undergo the procedure.                           After obtaining informed consent, the scope was                            passed under direct vision. Throughout the                            procedure, the patient's blood pressure, pulse, and                             oxygen saturations were monitored continuously. The                            TJF-Q180V ([id number]) scope was introduced through                            the mouth, and used to inject contrast into and                            used to inject contrast into the bile duct. The                            ERCP was technically difficult and complex due to                            challenging cannulation. The patient tolerated the                            procedure well. Scope In: 2:38:36 PM Scope Out: 3:27:39 PM Total Procedure Duration: 0 hours 49 minutes 3 seconds  Findings:      The scout film was normal. Duodenoscope could not be passed across       pylorus. A standard esophagogastroduodenoscopy scope was used for the       examination of the upper gastrointestinal tract. The scope was passed       under direct vision through the upper GI tract. A small area of       extrinsic compression was found in the proximal esophagus. Pylorus was       dilated with balloon to 15 mm. Duosenoscope then passed into duodenum.       The major papilla was normal. The bile duct was deeply cannulated with       the Hydratome sphincterotome. Contrast was injected. I personally       interpreted the bile duct images. There was brisk flow of contrast       through the ducts. Image quality was adequate. Contrast extended to the       entire biliary tree.  The common bile duct contained filling defect       thought to be a stone in distally. To discover objects, the biliary tree       was swept with a 9 mm balloon starting at the upper third of the main       bile duct. One stone was removed. No stones remained.      Extravasation of contrast noted from accessory duct. Impression:               - Extrinsic compression in the proximal esophagus.                           - Spastic pylorus. Dilated with balloon to 15 mm.                           - The major papilla appeared  normal.                           - Normal CBD and CHD with single small filling                            defect distally.                           - Bilary leak secondary to Duct of Luschka.                           - Biliary sphincterotomy performed and stone                            removed with balloon. Moderate Sedation:      Per Anesthesia Care Recommendation:           - Avoid aspirin and anti-coagulants for 72 hours.                           - Clear liquids.                           - Cipro 400 mg IV q 12 hours.                           - Lab in am. Procedure Code(s):        --- Professional ---                           [id number], Endoscopic retrograde                            cholangiopancreatography (ERCP); with removal of                            calculi/debris from biliary/pancreatic duct(s)                           43245, Esophagogastroduodenoscopy, flexible,  transoral; with dilation of gastric/duodenal                            stricture(s) (eg, balloon, bougie) Diagnosis Code(s):        --- Professional ---                           K22.2, Esophageal obstruction                           K80.50, Calculus of bile duct without cholangitis                            or cholecystitis without obstruction                           K85.90, Acute pancreatitis without necrosis or                            infection, unspecified                           R93.2, Abnormal findings on diagnostic imaging of                            liver and biliary tract CPT copyright 2019 [employer]. All rights reserved. The codes documented in this report are preliminary and upon coder review may  be revised to meet current compliance requirements. Lionel DecemberNajeeb Jamyson Jirak, MD Lionel DecemberNajeeb Dyana Magner, MD 07/11/2020 4:22:00 PM This report has been signed electronically. Number of Addenda: 0

## 2020-07-11 NOTE — Progress Notes (Signed)
PROGRESS NOTE  Annette MichaelsKayla M Gould BJY:[id number] DOB: 02-06-2001 DOA: 07/08/2020 PCP: Gweneth DimitriMcNeill, Wendy, MD  Brief History: 19 y.o.year old femalewith history of GERD who presented to the emergency room 10/5 for abdominal pain, chest pain, and back pain that has been waxing and waning over the last 4 days. She states she has actually had intermittent abdominal pain, nausea and vomitingthat she has been experiencing for the past 6 months. She had a negative HIDA scan on 01/08/20. EGD done on 04/15/20 showed extrinsic compression of the proximal esophagus. there were plans for her [place] this [place].  Shereports that her pain usually starts after meals. She says that 4 days ago she started having severe epigastric abdominal pain that radiated into the back. She tried to come to the emergency department but the wait was more than 10 hours and so she went back home and managed with being n.p.o. She reports that for the past 2 days she has not eaten or drink anything.She ate a banana and experienced severe recurrent abdominal pain that radiated into the back and she presented to the emergency department. Initial AST 234, ALT 330, Lipase 301, Tbili2.6  Assessment/Plan: Gallstone Pancreatitis -10/5 CT abd--nondistended abd with ill-defined GB wall and potential GB thickening -RUQ US--cholelithiasis with borderline GB thickening -10/5 MRCP--no choledocholithiasis -07/10/20--lap chole -appreciate surgical consult--discussed with Dr. Lovell SheehanJenkins -triglycerides 63  Choledocholithiasis -07/10/20--intraoperative cholangiogram-->contrast did not empty into duodenum-->Dr. Karilyn Cotaehman consult for ERCP -07/11/20--ERCP  Hypokalemia -replete -mag 2.0  GERD -continue PPI  External esophagus compression -Regarding her esophageal external compression, patient is scheduled to undergo EUS with Dr. Christella HartiganJacobs on 07/10/2020 -will need rescheduled      Status is:  Inpatient  Remains inpatient appropriate because:IV treatments appropriate due to intensity of illness or inability to take PO -choledocholithiasis requiring ERCP   Dispo: The patient is from:Home Anticipated d/c is YQ:MVHQto:Home Anticipated d/c date is: 2 day Patient currently is not medically stable to d/c.        Family Communication:Mother updatedat bedside 10/7  Consultants:GI, general surgery  Code Status: FULL   DVT Prophylaxis: [place] Lovenox   Procedures: As Listed in Progress Note Above  Antibiotics: None    Subjective:  Patient complain of right shoulder and RUQ pain, relieved with opioids.  Denies f/c/cp, sob, n/v/d. Objective: Vitals:   07/10/20 1239 07/10/20 1426 07/10/20 2110 07/11/20 0639  BP: 120/64 (!) 101/46 (!) 113/58 (!) 111/58  Pulse: 66 (!) 55 (!) 53 (!) 53  Resp: 20 20 18 16   Temp: 98.1 F (36.7 C) (!) 97.5 F (36.4 C) 98.2 F (36.8 C) 98.3 F (36.8 C)  TempSrc: Oral Oral Oral Oral  SpO2: 100% 98% 98% 98%  Weight:      Height:        Intake/Output Summary (Last 24 hours) at 07/11/2020 1224 Last data filed at 07/10/2020 1655 Gross per 24 hour  Intake 440 ml  Output --  Net 440 ml   Weight change:  Exam:   General:  Pt is alert, follows commands appropriately, not in acute distress  HEENT: No icterus, No thrush, No neck mass, [place]/AT  Cardiovascular: RRR, S1/S2, no rubs, no gallops  Respiratory: CTA bilaterally, no wheezing, no crackles, no rhonchi  Abdomen: Soft/+BS, epigastric, RUQ tender, non distended, no guarding  Extremities: No edema, No lymphangitis, No petechiae, No rashes, no synovitis   Data Reviewed: I have personally reviewed following labs and imaging studies  Basic Metabolic Panel: Recent Labs  Lab 07/08/20 0240 07/09/20 0546 07/10/20 0607 07/11/20 0717  NA 136 136 136 138  K 3.4* 4.0 3.6 3.7  CL 104 105 104 104  CO2 GLUCOSE 98  81 90 101*  BUN 5* 5* 5* 6  CREATININE 0.58 0.58 0.60 0.54  CALCIUM 8.9 8.8* 9.1 8.9  MG 2.2 2.0 1.9 1.9   Liver Function Tests: Recent Labs  Lab 07/08/20 0240 07/09/20 0546 07/10/20 0607 07/11/20 0717  AST 234* 65* 68*  67* 56*  ALT 330* 206* 180*  181* 169*  ALKPHOS 187* 142* 211*  214* 163*  BILITOT 2.6* 1.0 0.6  0.7 0.7  PROT 6.8 6.0* 6.5  6.4* 6.1*  ALBUMIN 4.0 3.2* 3.5  3.5 3.3*   Recent Labs  Lab 07/08/20 0240 07/09/20 0546 07/10/20 0607 07/11/20 0717  LIPASE 301* 43 49 32   No results for input(s): AMMONIA in the last 168 hours. Coagulation Profile: No results for input(s): INR, PROTIME in the last 168 hours. CBC: Recent Labs  Lab 07/08/20 0240 07/09/20 0546 07/10/20 0607 07/11/20 0717  WBC 9.2 5.7 6.4 9.7  NEUTROABS 5.8 2.9 3.2 7.2  HGB 13.7 12.0 12.6 11.6*  HCT 39.8 36.5 37.3 34.8*  MCV 78.7* 80.2 79.5* 80.9  PLT 316 245 277 263   Cardiac Enzymes: No results for input(s): CKTOTAL, CKMB, CKMBINDEX, TROPONINI in the last 168 hours. BNP: Invalid input(s): POCBNP CBG: No results for input(s): GLUCAP in the last 168 hours. HbA1C: No results for input(s): HGBA1C in the last 72 hours. Urine analysis:    Component Value Date/Time   COLORURINE YELLOW 12/28/2019 0850   APPEARANCEUR HAZY (A) 12/28/2019 0850   LABSPEC 1.018 12/28/2019 0850   PHURINE 6.0 12/28/2019 0850   GLUCOSEU NEGATIVE 12/28/2019 0850   HGBUR NEGATIVE 12/28/2019 0850   BILIRUBINUR NEGATIVE 12/28/2019 0850   KETONESUR 20 (A) 12/28/2019 0850   PROTEINUR NEGATIVE 12/28/2019 0850   NITRITE NEGATIVE 12/28/2019 0850   LEUKOCYTESUR NEGATIVE 12/28/2019 0850   Sepsis Labs: (procalcitonin:4,lacticidven:4) ) Recent Results (from the past 240 hour(s))  SARS CORONAVIRUS 2 (Anahy Esh 6-24 HRS) Nasopharyngeal Nasopharyngeal Swab     Status: None   Collection Time: 07/07/20  9:42 AM   Specimen: Nasopharyngeal Swab  Result Value Ref Range Status   SARS Coronavirus 2 NEGATIVE  NEGATIVE Final    Comment: (NOTE) SARS-CoV-2 target nucleic acids are NOT DETECTED.  The SARS-CoV-2 RNA is generally detectable in upper and lower respiratory specimens during the acute phase of infection. Negative results do not preclude SARS-CoV-2 infection, do not rule out co-infections with other pathogens, and should not be used as the sole basis for treatment or other patient management decisions. Negative results must be combined with clinical observations, patient history, and epidemiological information. The expected result is Negative.  Fact Sheet for Patients: HairSlick.no  Fact Sheet for Healthcare Providers: quierodirigir.com  This test is not yet approved or cleared by the [place] FDA and  has been authorized for detection and/or diagnosis of SARS-CoV-2 by FDA under an Emergency Use Authorization (EUA). This EUA will remain  in effect (meaning this test can be used) for the duration of the COVID-19 declaration under Se ction 564(b)(1) of the Act, 21 U.S.C. section 360bbb-3(b)(1), unless the authorization is terminated or revoked sooner.  Performed at [facility], [address] [place], [place] [address]   Respiratory Panel by RT PCR (Flu A&B, Covid) - Nasopharyngeal Swab  Status: None   Collection Time: 07/08/20  4:15 AM   Specimen: Nasopharyngeal Swab  Result Value Ref Range Status   SARS Coronavirus 2 by RT PCR NEGATIVE NEGATIVE Final    Comment: (NOTE) SARS-CoV-2 target nucleic acids are NOT DETECTED.  The SARS-CoV-2 RNA is generally detectable in upper respiratoy specimens during the acute phase of infection. The lowest concentration of SARS-CoV-2 viral copies this assay can detect is 131 copies/mL. A negative result does not preclude SARS-Cov-2 infection and should not be used as the sole basis for treatment or other patient management decisions. A negative result may occur with   improper specimen collection/handling, submission of specimen other than nasopharyngeal swab, presence of viral mutation(s) within the areas targeted by this assay, and inadequate number of viral copies (<131 copies/mL). A negative result must be combined with clinical observations, patient history, and epidemiological information. The expected result is Negative.  Fact Sheet for Patients:  https://www.moore.com/  Fact Sheet for Healthcare Providers:  https://www.young.biz/  This test is no t yet approved or cleared by the [place] FDA and  has been authorized for detection and/or diagnosis of SARS-CoV-2 by FDA under an Emergency Use Authorization (EUA). This EUA will remain  in effect (meaning this test can be used) for the duration of the COVID-19 declaration under Section 564(b)(1) of the Act, 21 U.S.C. section 360bbb-3(b)(1), unless the authorization is terminated or revoked sooner.     Influenza A by PCR NEGATIVE NEGATIVE Final   Influenza B by PCR NEGATIVE NEGATIVE Final    Comment: (NOTE) The Xpert Xpress SARS-CoV-2/FLU/RSV assay is intended as an aid in  the diagnosis of influenza from Nasopharyngeal swab specimens and  should not be used as a sole basis for treatment. Nasal washings and  aspirates are unacceptable for Xpert Xpress SARS-CoV-2/FLU/RSV  testing.  Fact Sheet for Patients: https://www.moore.com/  Fact Sheet for Healthcare Providers: https://www.young.biz/  This test is not yet approved or cleared by the [place] FDA and  has been authorized for detection and/or diagnosis of SARS-CoV-2 by  FDA under an Emergency Use Authorization (EUA). This EUA will remain  in effect (meaning this test can be used) for the duration of the  Covid-19 declaration under Section 564(b)(1) of the Act, 21  U.S.C. section 360bbb-3(b)(1), unless the authorization is  terminated or  revoked. Performed at [facility], [address] [place], [place] [address]   Nasopharyngeal Culture     Status: None (Preliminary result)   Collection Time: 07/10/20  5:40 AM   Specimen: Nasal Mucosa; Nasopharyngeal  Result Value Ref Range Status   Specimen Description   Final    NASOPHARYNGEAL Performed at [facility], [address] [place], [place] [address]    Special Requests   Final    NONE Performed at [facility], [address] [place], [place] [address]    Culture   Final    CULTURE REINCUBATED FOR BETTER GROWTH Performed at [facility], [address] [place], [place] [address]    Report Status PENDING  Incomplete     Scheduled Meds: . Chlorhexidine Gluconate Cloth  6 each Topical Once  . pantoprazole (PROTONIX) IV  40 mg Intravenous Q24H   Continuous Infusions: . sodium chloride    . lactated ringers 75 mL/hr at 07/10/20 1655    Procedures/Studies: DG Cholangiogram Operative  Result Date: 07/10/2020 CLINICAL DATA:  19 year old female with history of gallstone pancreatitis. EXAM: INTRAOPERATIVE CHOLANGIOGRAM TECHNIQUE: Cholangiographic images from the C-arm fluoroscopic device were  submitted for interpretation post-operatively. Please see the procedural report for the amount of contrast and the fluoroscopy time utilized. COMPARISON:  MRCP from 07/08/2020 FINDINGS: Single fluoroscopic image store from intraoperative cholangiogram demonstrates injection into the gallbladder lumen with patent cystic duct. There is mild dilation of the common bile duct and no significant dilation of the intrahepatic biliary tree. On the images provided, contrast is not visualized spilling into the duodenum and there is mild irregularity of the distal common bile duct. IMPRESSION: No definite evidence of choledocholithiasis, however contrast is not visualized spilling into the duodenum through the ampulla of Vater. Marliss Coots, MD [facility]  [facility] Electronically Signed   By: Marliss Coots MD   On: 07/10/2020 09:37   CT ABDOMEN PELVIS W CONTRAST  Result Date: 07/08/2020 CLINICAL DATA:  Epigastric pain. EXAM: CT ABDOMEN AND PELVIS WITH CONTRAST TECHNIQUE: Multidetector CT imaging of the abdomen and pelvis was performed using the standard protocol following bolus administration of intravenous contrast. CONTRAST:  OMNIPAQUE IOHEXOL 300 MG/ML  SOLN COMPARISON:  None. FINDINGS: Lower chest: Unremarkable. Hepatobiliary: No suspicious focal abnormality within the liver parenchyma. Gallbladder is nondistended with ill definition of the gallbladder wall and potential mild gallbladder wall thickening. No intrahepatic or extrahepatic biliary dilation. Pancreas: No focal mass lesion. No dilatation of the main duct. No intraparenchymal cyst. No peripancreatic edema. Spleen: No splenomegaly. No focal mass lesion. Adrenals/Urinary Tract: No adrenal nodule or mass. Kidneys unremarkable. No evidence for hydroureter. The urinary bladder appears normal for the degree of distention. Stomach/Bowel: Stomach is unremarkable. No gastric wall thickening. No evidence of outlet obstruction. No perigastric edema or inflammation. Duodenum is normally positioned as is the ligament of Treitz. No small bowel wall thickening. No small bowel dilatation. The terminal ileum is normal. The appendix is normal. No gross colonic mass. No colonic wall thickening. Vascular/Lymphatic: No abdominal aortic aneurysm. No abdominal aortic atherosclerotic calcification. There is no gastrohepatic or hepatoduodenal ligament lymphadenopathy. No retroperitoneal or mesenteric lymphadenopathy. No pelvic sidewall lymphadenopathy. Reproductive: The uterus is unremarkable.  There is no adnexal mass. Other: No intraperitoneal free fluid. Musculoskeletal: No worrisome lytic or sclerotic osseous abnormality. IMPRESSION: 1. Gallbladder is nondistended with ill definition of the gallbladder  wall and potential mild gallbladder wall thickening. No gallstones by CT. Right upper quadrant ultrasound may prove helpful to further evaluate as clinically warranted. 2. Otherwise unremarkable exam. Electronically Signed   By: Kennith Center M.D.   On: 07/08/2020 05:55   MR 3D Recon At Scanner  Result Date: 07/08/2020 CLINICAL DATA:  19 year old female with history of right upper quadrant abdominal pain with elevated liver enzymes. Suspected biliary tract obstruction and gallstone pancreatitis. EXAM: MRI ABDOMEN WITHOUT AND WITH CONTRAST (INCLUDING MRCP) TECHNIQUE: Multiplanar multisequence MR imaging of the abdomen was performed both before and after the administration of intravenous contrast. Heavily T2-weighted images of the biliary and pancreatic ducts were obtained, and three-dimensional MRCP images were rendered by post processing. CONTRAST:  62mL GADAVIST GADOBUTROL 1 MMOL/ML IV SOLN COMPARISON:  No prior abdominal MRI. CT the abdomen and pelvis 07/08/2020. FINDINGS: Lower chest: Unremarkable. Hepatobiliary: No suspicious cystic or solid hepatic lesions. Small filling defects are noted near the fundus of the gallbladder, compatible with tiny gallstones. Gallbladder does not appear distended. There is no gallbladder wall thickening or pericholecystic fluid. No intra or extrahepatic biliary ductal dilatation noted on MRCP images. Common bile duct measures 4 mm in the porta hepatis. No filling defects in the common bile duct to  suggest choledocholithiasis. Pancreas: No pancreatic mass. No pancreatic ductal dilatation. No pancreatic or peripancreatic fluid collections or inflammatory changes. Spleen:  Unremarkable. Adrenals/Urinary Tract: Bilateral kidneys and adrenal glands are normal in appearance. No hydroureteronephrosis in the visualized portions of the abdomen. Stomach/Bowel: Visualized portions are unremarkable. Vascular/Lymphatic: No aneurysm identified in the visualized abdominal vasculature. No  lymphadenopathy noted in the abdomen. Other: No significant volume of ascites noted in the visualized portions of the peritoneal cavity. Musculoskeletal: No aggressive appearing osseous lesions are noted in visualized portions of the peritoneal cavity. IMPRESSION: 1. Cholelithiasis without evidence of acute cholecystitis. 2. No evidence of choledocholithiasis. No findings of biliary tract obstruction. 3. No imaging findings to suggest an acute pancreatitis at this time. Electronically Signed   By: Trudie Reed M.D.   On: 07/08/2020 15:13   MR ABDOMEN MRCP W WO CONTAST  Result Date: 07/08/2020 CLINICAL DATA:  19 year old female with history of right upper quadrant abdominal pain with elevated liver enzymes. Suspected biliary tract obstruction and gallstone pancreatitis. EXAM: MRI ABDOMEN WITHOUT AND WITH CONTRAST (INCLUDING MRCP) TECHNIQUE: Multiplanar multisequence MR imaging of the abdomen was performed both before and after the administration of intravenous contrast. Heavily T2-weighted images of the biliary and pancreatic ducts were obtained, and three-dimensional MRCP images were rendered by post processing. CONTRAST:  60mL GADAVIST GADOBUTROL 1 MMOL/ML IV SOLN COMPARISON:  No prior abdominal MRI. CT the abdomen and pelvis 07/08/2020. FINDINGS: Lower chest: Unremarkable. Hepatobiliary: No suspicious cystic or solid hepatic lesions. Small filling defects are noted near the fundus of the gallbladder, compatible with tiny gallstones. Gallbladder does not appear distended. There is no gallbladder wall thickening or pericholecystic fluid. No intra or extrahepatic biliary ductal dilatation noted on MRCP images. Common bile duct measures 4 mm in the porta hepatis. No filling defects in the common bile duct to suggest choledocholithiasis. Pancreas: No pancreatic mass. No pancreatic ductal dilatation. No pancreatic or peripancreatic fluid collections or inflammatory changes. Spleen:  Unremarkable. Adrenals/Urinary  Tract: Bilateral kidneys and adrenal glands are normal in appearance. No hydroureteronephrosis in the visualized portions of the abdomen. Stomach/Bowel: Visualized portions are unremarkable. Vascular/Lymphatic: No aneurysm identified in the visualized abdominal vasculature. No lymphadenopathy noted in the abdomen. Other: No significant volume of ascites noted in the visualized portions of the peritoneal cavity. Musculoskeletal: No aggressive appearing osseous lesions are noted in visualized portions of the peritoneal cavity. IMPRESSION: 1. Cholelithiasis without evidence of acute cholecystitis. 2. No evidence of choledocholithiasis. No findings of biliary tract obstruction. 3. No imaging findings to suggest an acute pancreatitis at this time. Electronically Signed   By: Trudie Reed M.D.   On: 07/08/2020 15:13   US Abdomen Limited RUQ  Result Date: 07/08/2020 CLINICAL DATA:  Right upper quadrant pain EXAM: ULTRASOUND ABDOMEN LIMITED RIGHT UPPER QUADRANT COMPARISON:  CT abdomen and pelvis July 08, 2020; abdominal ultrasound December 28, 2019 FINDINGS: Gallbladder: Within the gallbladder, there are echogenic foci which move and shadow consistent with cholelithiasis. Gallbladder wall is borderline thickened without edema. No pericholecystic fluid. No sonographic Murphy sign noted by sonographer. Common bile duct: Diameter: 3 mm. No intrahepatic or extrahepatic biliary duct dilatation. Liver: No focal lesion identified. Within normal limits in parenchymal echogenicity. Portal vein is patent on color Doppler imaging with normal direction of blood flow towards the liver. Other: None. IMPRESSION: Cholelithiasis with borderline gallbladder wall thickening. These findings could be indicative of early acute cholecystitis. In this regard, it may be prudent to correlate with nuclear medicine hepatobiliary imaging study to assess for  cystic duct patency. Study otherwise unremarkable. Electronically Signed   By: Bretta Bang III M.D.   On: 07/08/2020 08:15    Catarina Hartshorn, DO  [facility]  If 7PM-7AM, please contact night-coverage [email or web address] Password [email or web address] 07/11/2020, 12:24 PM   LOS: 3 days

## 2020-07-11 NOTE — Transfer of Care (Signed)
Immediate Anesthesia Transfer of Care Note  Patient: Annette Gould  Procedure(s) Performed: ENDOSCOPIC RETROGRADE CHOLANGIOPANCREATOGRAPHY (ERCP) (N/A ) SPHINCTEROTOMY PYLORICE CHANNEL DILALATION , NORMAL SIZE BILE DUCT (N/A ) STONE EXTRACTION WITH WITH BALLOON (N/A )  Patient Location: PACU  Anesthesia Type:General  Level of Consciousness: awake  Airway & Oxygen Therapy: Patient Spontanous Breathing and Patient connected to nasal cannula oxygen  Post-op Assessment: Report given to RN  Post vital signs: Reviewed and stable  Last Vitals:  Vitals Value Taken Time  BP 109/72 07/11/20 1547  Temp    Pulse 108 07/11/20 1554  Resp 18 07/11/20 1554  SpO2 93 % 07/11/20 1554  Vitals shown include unvalidated device data.  Last Pain:  Vitals:   07/11/20 0639  TempSrc: Oral  PainSc:       Patients Stated Pain Goal: 4 (07/10/20 0950)  Complications: No complications documented.

## 2020-07-11 NOTE — Anesthesia Procedure Notes (Signed)
Procedure Name: Intubation Date/Time: 07/11/2020 2:25 PM Performed by: Vista Deck, CRNA Pre-anesthesia Checklist: Patient identified, Patient being monitored, Timeout performed, Emergency Drugs available and Suction available Patient Re-evaluated:Patient Re-evaluated prior to induction Oxygen Delivery Method: Circle System Utilized Preoxygenation: Pre-oxygenation with 100% oxygen Induction Type: IV induction Laryngoscope Size: Mac and 3 Grade View: Grade I Tube type: Oral Tube size: 7.0 mm Number of attempts: 1 Airway Equipment and Method: stylet Placement Confirmation: ETT inserted through vocal cords under direct vision,  positive ETCO2 and breath sounds checked- equal and bilateral Secured at: 21 cm Tube secured with: Tape Dental Injury: Teeth and Oropharynx as per pre-operative assessment

## 2020-07-11 NOTE — Progress Notes (Signed)
Dr. Karilyn Cota in to see pt and discuss procedure. Pt and mother state understanding. OR staff in to transport pt downstairs via bed for procedure.

## 2020-07-11 NOTE — Progress Notes (Signed)
Pt c/o right shoulder and right upper quadrant pain and pressure. Pt with standby assist ambulated entire length of hallway without difficulty. Now states that shoulder pain is gone, still some RUQ soreness. Pt states not passing flatus but is burping a lot. No c/o n/v. Pt remains NPO for procedure this afternoon.

## 2020-07-11 NOTE — Progress Notes (Signed)
Pt returned via stretcher from PACU. Pt alert and oriented, no c/o pain or n/v, just throat dryness. VSS. Pt assisted up OOB to bathroom to void. Bed linens changed. IVF infusing without difficulty or s/s infiltration.

## 2020-07-11 NOTE — Anesthesia Preprocedure Evaluation (Signed)
Anesthesia Evaluation  Patient identified by MRN, date of birth, ID band Patient awake    Reviewed: Allergy & Precautions, H&P , NPO status , Patient's Chart, lab work & pertinent test results, reviewed documented beta blocker date and time   Airway Mallampati: II  TM Distance: >3 FB Neck ROM: full    Dental no notable dental hx. (+) Teeth Intact   Pulmonary neg pulmonary ROS,    Pulmonary exam normal breath sounds clear to auscultation       Cardiovascular Exercise Tolerance: Good negative cardio ROS   Rhythm:regular Rate:Normal     Neuro/Psych  Headaches, negative psych ROS   GI/Hepatic Neg liver ROS, GERD  Medicated,  Endo/Other  negative endocrine ROS  Renal/GU negative Renal ROS  negative genitourinary   Musculoskeletal   Abdominal   Peds  Hematology negative hematology ROS (+)   Anesthesia Other Findings   Reproductive/Obstetrics negative OB ROS                             Anesthesia Physical Anesthesia Plan  ASA: II  Anesthesia Plan: General   Post-op Pain Management:    Induction:   PONV Risk Score and Plan: Ondansetron  Airway Management Planned:   Additional Equipment:   Intra-op Plan:   Post-operative Plan:   Informed Consent: I have reviewed the patients History and Physical, chart, labs and discussed the procedure including the risks, benefits and alternatives for the proposed anesthesia with the patient or authorized representative who has indicated his/her understanding and acceptance.     Dental Advisory Given  Plan Discussed with: CRNA  Anesthesia Plan Comments:         Anesthesia Quick Evaluation  

## 2020-07-11 NOTE — Progress Notes (Signed)
Spoke with anesthesiolgist Kiel regarding pt's uvula. States no real concern, should be self limiting. Monitor and offer ice chips for comfort. Pt and mother updated. MD Tat updated as well. Pt resting quietly, no resp difficulty or distress. No c/o pain or n/v.

## 2020-07-11 NOTE — Progress Notes (Signed)
Subjective:   Patient feels better.  She is having mild pain in the right rib cage and posterior laterally.  She denies chest pain or shortness of breath.  She does not feel nauseated but does not have an appetite.  Current Medications:  Current Facility-Administered Medications:  .  0.9 %  sodium chloride infusion, , Intravenous, Continuous, Sharniece Gibbon, Mechele Dawley, MD .  Doug Sou Hold] acetaminophen (TYLENOL) tablet 650 mg, 650 mg, Oral, Q6H PRN **OR** [MAR Hold] acetaminophen (TYLENOL) suppository 650 mg, 650 mg, Rectal, Q6H PRN, Aviva Signs, MD .  [COMPLETED] 6 CHG cloth bath night before surgery, , , Once **AND** [COMPLETED] 6 CHG cloth bath AM of surgery, , , Once **AND** [COMPLETED] Chlorhexidine Gluconate Cloth 2 % PADS 6 each, 6 each, Topical, Once, 6 each at 07/09/20 2123 **AND** [MAR Hold] Chlorhexidine Gluconate Cloth 2 % PADS 6 each, 6 each, Topical, Once, Aviva Signs, MD .  glucagon (human recombinant) (GLUCAGEN) 1 MG injection, , , ,  .  [MAR Hold] HYDROcodone-acetaminophen (NORCO/VICODIN) 5-325 MG per tablet 1-2 tablet, 1-2 tablet, Oral, Q4H PRN, Aviva Signs, MD, 2 tablet at 07/10/20 1534 .  [MAR Hold] HYDROmorphone (DILAUDID) injection 1 mg, 1 mg, Intravenous, Q2H PRN, Aviva Signs, MD, 1 mg at 07/11/20 9826 .  [COMPLETED] ketorolac (TORADOL) 30 MG/ML injection 30 mg, 30 mg, Intravenous, Q6H, 30 mg at 07/10/20 1224 **FOLLOWED BY** [MAR Hold] ketorolac (TORADOL) 30 MG/ML injection 30 mg, 30 mg, Intravenous, Q6H PRN, Aviva Signs, MD, 30 mg at 07/11/20 1200 .  lactated ringers infusion, , Intravenous, Continuous, Aviva Signs, MD, Last Rate: 75 mL/hr at 07/10/20 1655, Rate Verify at 07/10/20 1655 .  [MAR Hold] LORazepam (ATIVAN) injection 1 mg, 1 mg, Intravenous, Q4H PRN, Aviva Signs, MD .  Doug Sou Hold] ondansetron [facility]) tablet 4 mg, 4 mg, Oral, Q6H PRN **OR** [MAR Hold] ondansetron (ZOFRAN) injection 4 mg, 4 mg, Intravenous, Q6H PRN, Aviva Signs, MD, 4 mg at 07/10/20 1310 .   [MAR Hold] pantoprazole (PROTONIX) injection 40 mg, 40 mg, Intravenous, Q24H, Aviva Signs, MD, 40 mg at 07/11/20 1159 .  [MAR Hold] simethicone (MYLICON) chewable tablet 40 mg, 40 mg, Oral, Q6H PRN, Aviva Signs, MD .  sodium chloride 0.9 % infusion, , , ,    Objective: Blood pressure (!) 111/58, pulse (!) 53, temperature 98.3 F (36.8 C), temperature source Oral, resp. rate 16, height 5' 2"  (1.575 m), weight 63.5 kg, last menstrual period 06/24/2020, SpO2 98 %. Patient is alert and in no acute distress. Cardiac exam with regular rhythm normal S1 and S2.  No murmur gallop noted. Auscultation lungs reveal vesicular breath sounds bilaterally. Abdomen is symmetrical with normal bowel sounds.  She has 4 laparoscopy ports.  On palpation abdomen is soft.  She has mild tenderness over the right costal margin.  No organomegaly or masses.  Labs/studies Results:  CBC Latest Ref Rng & Units 07/11/2020 07/10/2020 07/09/2020  WBC 4.0 - 10.5 K/uL 9.7 6.4 5.7  Hemoglobin 12.0 - 15.0 g/dL 11.6(L) 12.6 12.0  Hematocrit 36 - 46 % 34.8(L) 37.3 36.5  Platelets 150 - 400 K/uL 263 277 245    CMP Latest Ref Rng & Units 07/11/2020 07/10/2020 07/10/2020  Glucose 70 - 99 mg/dL 101(H) - 90  BUN 6 - 20 mg/dL 6 - 5(L)  Creatinine 0.44 - 1.00 mg/dL 0.54 - 0.60  Sodium 135 - 145 mmol/L 138 - 136  Potassium 3.5 - 5.1 mmol/L 3.7 - 3.6  Chloride 98 - 111 mmol/L 104 - 104  CO2 22 - 32 mmol/L 27 - 24  Calcium 8.9 - 10.3 mg/dL 8.9 - 9.1  Total Protein 6.5 - 8.1 g/dL 6.1(L) 6.5 6.4(L)  Total Bilirubin 0.3 - 1.2 mg/dL 0.7 0.6 0.7  Alkaline Phos 38 - 126 U/L 163(H) 211(H) 214(H)  AST 15 - 41 U/L 56(H) 68(H) 67(H)  ALT 0 - 44 U/L 169(H) 180(H) 181(H)    Hepatic Function Latest Ref Rng & Units 07/11/2020 07/10/2020 07/10/2020  Total Protein 6.5 - 8.1 g/dL 6.1(L) 6.5 6.4(L)  Albumin 3.5 - 5.0 g/dL 3.3(L) 3.5 3.5  AST 15 - 41 U/L 56(H) 68(H) 67(H)  ALT 0 - 44 U/L 169(H) 180(H) 181(H)  Alk Phosphatase 38 - 126 U/L 163(H)  211(H) 214(H)  Total Bilirubin 0.3 - 1.2 mg/dL 0.7 0.6 0.7  Bilirubin, Direct 0.0 - 0.2 mg/dL - 0.2 -     Serum lipase 32   Assessment:  #1.  Biliary pancreatitis.  Multiple imaging studies negative for choledocholithiasis.  Therefore it was presumed that she passed a stone.  Intraoperative cholangiogram yesterday revealed distal CBD obstruction felt to be due to small stone.  Patient's transaminases remain abnormal although trending downwards.  #2.  Cholelithiasis.  Status post lap chole yesterday.  Plan:  Proceed with ERCP with biliary sphincterotomy and stone extraction. Patient will be given Ancef for prophylaxis.

## 2020-07-11 NOTE — Progress Notes (Signed)
Pt complains of feeling like something is "hanging" in her throat. Upon visual inspection, pt's uvula is severely swollen. Pt able to protect airway, no resp difficulty, SaO2 98% on room air. Pt just keeps attempting to clear her throat. MD's Tat and Rehman notified.

## 2020-07-11 NOTE — Progress Notes (Addendum)
Brief ERCP note.  Spastic pyloric channel had to be dilated with balloon to 15 mm before due to doing scope could be advanced into the duodenum. Extrinsic compression in proximal esophagus not as conspicuous as noted on prior EGD by Dr. Christella Hartigan. Normal ampulla of Vater. Difficult cannulation of bile duct. PD cannulated twice with the guidewire but no contrast injected. Normal size CBD and intrahepatic biliary systems. Extravasation of contrast appears to be originating from accessory duct connected to right hepatic system. Biliary sphincterotomy performed with removal of single small cholesterol stone with balloon stone extractor. Patient tolerated the procedure well.

## 2020-07-11 NOTE — Anesthesia Postprocedure Evaluation (Signed)
Anesthesia Post Note  Patient: Annette Gould  Procedure(s) Performed: ENDOSCOPIC RETROGRADE CHOLANGIOPANCREATOGRAPHY (ERCP) (N/A ) SPHINCTEROTOMY PYLORICE CHANNEL DILALATION , NORMAL SIZE BILE DUCT (N/A ) STONE EXTRACTION WITH WITH BALLOON (N/A )  Patient location during evaluation: PACU Anesthesia Type: General Level of consciousness: awake Pain management: pain level controlled Vital Signs Assessment: post-procedure vital signs reviewed and stable Respiratory status: spontaneous breathing Cardiovascular status: blood pressure returned to baseline Postop Assessment: no headache Anesthetic complications: no   No complications documented.   Last Vitals:  Vitals:   07/11/20 1600 07/11/20 1615  BP: 119/71 127/71  Pulse: 82 84  Resp: 15 16  Temp:    SpO2: 92% 95%    Last Pain:  Vitals:   07/11/20 1615  TempSrc:   PainSc: 0-No pain                 Windell Norfolk

## 2020-07-11 NOTE — Progress Notes (Signed)
Pt sleeping, easily awakened. Denies c/o pain, no resp difficulty. Uvula remains swollen but pt states she no longer feels like something is hanging in her throat. Has tolerated ice chips without difficulty. Pt now given clear liquids per new diet order. Pt currently eating popsicle and sipping on gingerale.

## 2020-07-12 DIAGNOSIS — D649 Anemia, unspecified: Secondary | ICD-10-CM

## 2020-07-12 LAB — COMPREHENSIVE METABOLIC PANEL
ALT: 112 U/L — ABNORMAL HIGH (ref 0–44)
AST: 35 U/L (ref 15–41)
Albumin: 3.1 g/dL — ABNORMAL LOW (ref 3.5–5.0)
Alkaline Phosphatase: 123 U/L (ref 38–126)
Anion gap: 8 (ref 5–15)
BUN: 6 mg/dL (ref 6–20)
CO2: 24 mmol/L (ref 22–32)
Calcium: 8.6 mg/dL — ABNORMAL LOW (ref 8.9–10.3)
Chloride: 106 mmol/L (ref 98–111)
Creatinine, Ser: 0.58 mg/dL (ref 0.44–1.00)
GFR, Estimated: >60 mL/min (ref >60)
Glucose, Bld: 92 mg/dL (ref 70–99)
Potassium: 3.7 mmol/L (ref 3.5–5.1)
Sodium: 138 mmol/L (ref 135–145)
Total Bilirubin: 1 mg/dL (ref 0.3–1.2)
Total Protein: 5.8 g/dL — ABNORMAL LOW (ref 6.5–8.1)

## 2020-07-12 LAB — CBC WITH DIFFERENTIAL/PLATELET
Abs Immature Granulocytes: 0.01 K/uL (ref 0.00–0.07)
Basophils Absolute: 0 K/uL (ref 0.0–0.1)
Basophils Relative: 0 %
Eosinophils Absolute: 0 K/uL (ref 0.0–0.5)
Eosinophils Relative: 0 %
HCT: 32.7 % — ABNORMAL LOW (ref 36.0–46.0)
Hemoglobin: 10.8 g/dL — ABNORMAL LOW (ref 12.0–15.0)
Immature Granulocytes: 0 %
Lymphocytes Relative: 22 %
Lymphs Abs: 1.6 K/uL (ref 0.7–4.0)
MCH: 26.3 pg (ref 26.0–34.0)
MCHC: 33 g/dL (ref 30.0–36.0)
MCV: 79.6 fL — ABNORMAL LOW (ref 80.0–100.0)
Monocytes Absolute: 0.8 K/uL (ref 0.1–1.0)
Monocytes Relative: 11 %
Neutro Abs: 4.9 K/uL (ref 1.7–7.7)
Neutrophils Relative %: 67 %
Platelets: 236 K/uL (ref 150–400)
RBC: 4.11 MIL/uL (ref 3.87–5.11)
RDW: 12.2 % (ref 11.5–15.5)
WBC: 7.3 K/uL (ref 4.0–10.5)
nRBC: 0 % (ref 0.0–0.2)

## 2020-07-12 LAB — NASOPHARYNGEAL CULTURE: Culture: NORMAL

## 2020-07-12 LAB — LIPASE, BLOOD: Lipase: 30 U/L (ref 11–51)

## 2020-07-12 LAB — MAGNESIUM: Magnesium: 1.9 mg/dL (ref 1.7–2.4)

## 2020-07-12 MED ORDER — PANTOPRAZOLE SODIUM 40 MG PO TBEC
40.0000 mg | DELAYED_RELEASE_TABLET | Freq: Every day | ORAL | Status: DC
  Administered 2020-07-12: 40 mg via ORAL
  Filled 2020-07-12: qty 1

## 2020-07-12 MED ORDER — CIPROFLOXACIN HCL 500 MG PO TABS
500.0000 mg | ORAL_TABLET | Freq: Two times a day (BID) | ORAL | 0 refills | Status: DC
Start: 2020-07-12 — End: 2020-09-11

## 2020-07-12 MED ORDER — CIPROFLOXACIN HCL 250 MG PO TABS: 500.0000 mg | ORAL_TABLET | Freq: Two times a day (BID) | ORAL | Status: DC

## 2020-07-12 MED ORDER — PANTOPRAZOLE SODIUM 40 MG PO TBEC: 40.0000 mg | DELAYED_RELEASE_TABLET | Freq: Every day | ORAL | 3 refills | Status: DC

## 2020-07-12 MED ORDER — HYDROCODONE-ACETAMINOPHEN 5-325 MG PO TABS
1.0000 | ORAL_TABLET | ORAL | 0 refills | Status: DC | PRN
Start: 2020-07-12 — End: 2020-09-11

## 2020-07-12 NOTE — Progress Notes (Signed)
MD notified of IV infiltration and pt request to leave the IV out after unsuccessful attempt as she stated she is going home today. MD advised ok to leave IV out.

## 2020-07-12 NOTE — Progress Notes (Signed)
Pt ambulated entire length of unit hallway without difficulty or assistance. Tolerated clear liquid breakfast without n/v. Currently states abd "sore" but no c/o pain. Pt states that she had a small soft BM this am and is passing gas.

## 2020-07-12 NOTE — Progress Notes (Signed)
Appreciate Dr. Patty Sermons assistance.  Accessory duct should close with sphincterotomy.  Will monitor.  Okay for discharge.  We will follow-up with patient next week.

## 2020-07-12 NOTE — Progress Notes (Signed)
Pt discharged via wheelchair to POV with all personal belongings in her possession.

## 2020-07-12 NOTE — Discharge Instructions (Signed)
Laparoscopic Cholecystectomy, Care After This sheet gives you information about how to care for yourself after your procedure. Your health care provider may also give you more specific instructions. If you have problems or questions, contact your health care provider. What can I expect after the procedure? After the procedure, it is common to have:  Pain at your incision sites. You will be given medicines to control this pain.  Mild nausea or vomiting.  Bloating and possible shoulder pain from the air-like gas that was used during the procedure. Follow these instructions at home: Incision care   Follow instructions from your health care provider about how to take care of your incisions. Make sure you: ? Wash your hands with soap and water before you change your bandage (dressing). If soap and water are not available, use hand sanitizer. ? Change your dressing as told by your health care provider. ? Leave stitches (sutures), skin glue, or adhesive strips in place. These skin closures may need to be in place for 2 weeks or longer. If adhesive strip edges start to loosen and curl up, you may trim the loose edges. Do not remove adhesive strips completely unless your health care provider tells you to do that.  Do not take baths, swim, or use a hot tub until your health care provider approves. Ask your health care provider if you can take showers. You may only be allowed to take sponge baths for bathing.  Check your incision area every day for signs of infection. Check for: ? More redness, swelling, or pain. ? More fluid or blood. ? Warmth. ? Pus or a bad smell. Activity  Do not drive or use heavy machinery while taking prescription pain medicine.  Do not lift anything that is heavier than 10 lb (4.5 kg) until your health care provider approves.  Do not play contact sports until your health care provider approves.  Do not drive for 24 hours if you were given a medicine to help you relax  (sedative).  Rest as needed. Do not return to work or school until your health care provider approves. General instructions  Take over-the-counter and prescription medicines only as told by your health care provider.  To prevent or treat constipation while you are taking prescription pain medicine, your health care provider may recommend that you: ? Drink enough fluid to keep your urine clear or pale yellow. ? Take over-the-counter or prescription medicines. ? Eat foods that are high in fiber, such as fresh fruits and vegetables, whole grains, and beans. ? Limit foods that are high in fat and processed sugars, such as fried and sweet foods. Contact a health care provider if:  You develop a rash.  You have more redness, swelling, or pain around your incisions.  You have more fluid or blood coming from your incisions.  Your incisions feel warm to the touch.  You have pus or a bad smell coming from your incisions.  You have a fever.  One or more of your incisions breaks open. Get help right away if:  You have trouble breathing.  You have chest pain.  You have increasing pain in your shoulders.  You faint or feel dizzy when you stand.  You have severe pain in your abdomen.  You have nausea or vomiting that lasts for more than one day.  You have leg pain. This information is not intended to replace advice given to you by your health care provider. Make sure you discuss any questions you have   with your health care provider. Document Revised: 09/02/2017 Document Reviewed: 03/08/2016 [employer] Patient Education  2020 [employer]  

## 2020-07-12 NOTE — Discharge Summary (Signed)
Physician Discharge Summary  Annette MichaelsKayla M Gould WUJ:[id number]:[id number] DOB: [birth year] DOA: 07/08/2020  PCP: Gweneth DimitriMcNeill, Wendy, MD  Admit date: 07/08/2020 Discharge date: 07/12/2020  Admitted From: Home Disposition:  Home   Recommendations for Outpatient Follow-up:  1. Follow up with PCP in 1-2 weeks 2. Please obtain BMP/CBC in one week     Discharge Condition: Stable CODE STATUS: FULL Diet recommendation: soft   Brief/Interim Summary: 19 y.o.year old femalewith history of GERD who presented to the emergency room 10/5 for abdominal pain, chest pain, and back pain that has been waxing and waning over the last 4 days. She states she has actually had intermittent abdominal pain, nausea and vomitingthat she has been experiencing for the past 6 months. She had a negative HIDA scan on 01/08/20. EGD done on 04/15/20 showed extrinsic compression of the proximal esophagus. there were plans for [place]  Shereports that her pain usually starts after meals. She says that 4 days ago she started having severe epigastric abdominal pain that radiated into the back. She tried to come to the emergency department but the wait was more than 10 hours and so she went back home and managed with being n.p.o. She reports that for the past 2 days she has not eaten or drink anything.She ate a banana and experienced severe recurrent abdominal pain that radiated into the back and she presented to the emergency department. Initial AST 234, ALT 330, Lipase 301, Tbili2.6 GI and general surgery were consulted  Discharge Diagnoses:  Gallstone Pancreatitis -10/5 CT abd--nondistended abd with ill-defined GB wall and potential GB thickening -RUQ US--cholelithiasis with borderline GB thickening -10/5 MRCP--no choledocholithiasis -07/10/20--lap chole -appreciate surgical consult--discussed with Dr. Lovell SheehanJenkins -triglycerides 63 -07/10/20--lap chole-intraoperative cholangiogram-->contrast  did not empty into duodenum-->Dr. Karilyn Cotaehman consult for ERCP  Choledocholithiasis -07/10/20--intraoperative cholangiogram-->contrast did not empty into duodenum-->Dr. Karilyn Cotaehman consult for ERCP -07/11/20--ERCP with sphincterotomy and stone extraction  Bile leak -noted on ERCP -secondary to duct of Luschka -d/c home with cipro x 5 days per GI -should improve post sphincterotomy -discussed with Dr. Alesia RichardsJenkins--ok to d/c home 10/9  Hypokalemia -replete -mag 2.0  GERD -continue PPI  External esophagus compression -Regarding her esophageal external compression, patient is scheduled to undergo EUS with Dr. Christella HartiganJacobs on 07/10/2020 -will need rescheduled   Discharge Instructions   Allergies as of 07/12/2020      Reactions   Peanut-containing Drug Products Hives, Swelling   Peanuts      Medication List    TAKE these medications   Aurovela Fe 1.5/30 1.5-30 MG-MCG tablet Generic drug: norethindrone-ethinyl estradiol-iron Take 1 tablet by mouth daily.   ciprofloxacin 500 MG tablet Commonly known as: CIPRO Take 1 tablet (500 mg total) by mouth 2 (two) times daily.   HYDROcodone-acetaminophen 5-325 MG tablet Commonly known as: NORCO/VICODIN Take 1 tablet by mouth every 4 (four) hours as needed for moderate pain.   ondansetron 4 MG disintegrating tablet Commonly known as: Zofran ODT Take 1 tablet (4 mg total) by mouth every 8 (eight) hours as needed.   pantoprazole 40 MG tablet Commonly known as: Protonix Take 1 tablet (40 mg total) by mouth daily. What changed: when to take this   propranolol 10 MG tablet Commonly known as: INDERAL Take 10 mg by mouth daily as needed (If blood pressure rises).   sertraline 50 MG tablet Commonly known as: ZOLOFT Take 75 mg by mouth daily.       Follow-up Information    Franky MachoJenkins, Mark, MD  Follow up.   Specialty: General Surgery Why: Will call you next week for follow up Contact information: [id number] [place]  [id number] [phone]              Allergies  Allergen Reactions  . Peanut-Containing Drug Products Hives and Swelling    Peanuts    Consultations:  GI  General surgery   Procedures/Studies: DG Cholangiogram Operative  Result Date: 07/10/2020 CLINICAL DATA:  19 year old female with history of gallstone pancreatitis. EXAM: INTRAOPERATIVE CHOLANGIOGRAM TECHNIQUE: Cholangiographic images from the C-arm fluoroscopic device were submitted for interpretation post-operatively. Please see the procedural report for the amount of contrast and the fluoroscopy time utilized. COMPARISON:  MRCP from 07/08/2020 FINDINGS: Single fluoroscopic image store from intraoperative cholangiogram demonstrates injection into the gallbladder lumen with patent cystic duct. There is mild dilation of the common bile duct and no significant dilation of the intrahepatic biliary tree. On the images provided, contrast is not visualized spilling into the duodenum and there is mild irregularity of the distal common bile duct. IMPRESSION: No definite evidence of choledocholithiasis, however contrast is not visualized spilling into the duodenum through the ampulla of Vater. Marliss Coots, MD [facility] Radiology Electronically Signed   By: Marliss Coots MD   On: 07/10/2020 09:37   CT ABDOMEN PELVIS W CONTRAST  Result Date: 07/08/2020 CLINICAL DATA:  Epigastric pain. EXAM: CT ABDOMEN AND PELVIS WITH CONTRAST TECHNIQUE: Multidetector CT imaging of the abdomen and pelvis was performed using the standard protocol following bolus administration of intravenous contrast. CONTRAST:  OMNIPAQUE IOHEXOL 300 MG/ML  SOLN COMPARISON:  None. FINDINGS: Lower chest: Unremarkable. Hepatobiliary: No suspicious focal abnormality within the liver parenchyma. Gallbladder is nondistended with ill definition of the gallbladder wall and potential mild gallbladder wall thickening. No intrahepatic or  extrahepatic biliary dilation. Pancreas: No focal mass lesion. No dilatation of the main duct. No intraparenchymal cyst. No peripancreatic edema. Spleen: No splenomegaly. No focal mass lesion. Adrenals/Urinary Tract: No adrenal nodule or mass. Kidneys unremarkable. No evidence for hydroureter. The urinary bladder appears normal for the degree of distention. Stomach/Bowel: Stomach is unremarkable. No gastric wall thickening. No evidence of outlet obstruction. No perigastric edema or inflammation. Duodenum is normally positioned as is the ligament of Treitz. No small bowel wall thickening. No small bowel dilatation. The terminal ileum is normal. The appendix is normal. No gross colonic mass. No colonic wall thickening. Vascular/Lymphatic: No abdominal aortic aneurysm. No abdominal aortic atherosclerotic calcification. There is no gastrohepatic or hepatoduodenal ligament lymphadenopathy. No retroperitoneal or mesenteric lymphadenopathy. No pelvic sidewall lymphadenopathy. Reproductive: The uterus is unremarkable.  There is no adnexal mass. Other: No intraperitoneal free fluid. Musculoskeletal: No worrisome lytic or sclerotic osseous abnormality. IMPRESSION: 1. Gallbladder is nondistended with ill definition of the gallbladder wall and potential mild gallbladder wall thickening. No gallstones by CT. Right upper quadrant ultrasound may prove helpful to further evaluate as clinically warranted. 2. Otherwise unremarkable exam. Electronically Signed   By: Kennith Center M.D.   On: 07/08/2020 05:55   MR 3D Recon At Scanner  Result Date: 07/08/2020 CLINICAL DATA:  19 year old female with history of right upper quadrant abdominal pain with elevated liver enzymes. Suspected biliary tract obstruction and gallstone pancreatitis. EXAM: MRI ABDOMEN WITHOUT AND WITH CONTRAST (INCLUDING MRCP) TECHNIQUE: Multiplanar multisequence MR imaging of the abdomen was performed both before and after the administration of intravenous  contrast. Heavily T2-weighted images of the biliary and pancreatic ducts were obtained, and three-dimensional MRCP images were  rendered by post processing. CONTRAST:  7mL GADAVIST GADOBUTROL 1 MMOL/ML IV SOLN COMPARISON:  No prior abdominal MRI. CT the abdomen and pelvis 07/08/2020. FINDINGS: Lower chest: Unremarkable. Hepatobiliary: No suspicious cystic or solid hepatic lesions. Small filling defects are noted near the fundus of the gallbladder, compatible with tiny gallstones. Gallbladder does not appear distended. There is no gallbladder wall thickening or pericholecystic fluid. No intra or extrahepatic biliary ductal dilatation noted on MRCP images. Common bile duct measures 4 mm in the porta hepatis. No filling defects in the common bile duct to suggest choledocholithiasis. Pancreas: No pancreatic mass. No pancreatic ductal dilatation. No pancreatic or peripancreatic fluid collections or inflammatory changes. Spleen:  Unremarkable. Adrenals/Urinary Tract: Bilateral kidneys and adrenal glands are normal in appearance. No hydroureteronephrosis in the visualized portions of the abdomen. Stomach/Bowel: Visualized portions are unremarkable. Vascular/Lymphatic: No aneurysm identified in the visualized abdominal vasculature. No lymphadenopathy noted in the abdomen. Other: No significant volume of ascites noted in the visualized portions of the peritoneal cavity. Musculoskeletal: No aggressive appearing osseous lesions are noted in visualized portions of the peritoneal cavity. IMPRESSION: 1. Cholelithiasis without evidence of acute cholecystitis. 2. No evidence of choledocholithiasis. No findings of biliary tract obstruction. 3. No imaging findings to suggest an acute pancreatitis at this time. Electronically Signed   By: Trudie Reed M.D.   On: 07/08/2020 15:13   DG ERCP BILIARY & PANCREATIC DUCTS  Result Date: 07/11/2020 CLINICAL DATA:  Acute biliary pancreatitis, CBD stone EXAM: ERCP WITH SPHINCTEROTOMY AND  STONE BASKET EXTRACTION OF A CBD STONE TECHNIQUE: Multiple spot images obtained with the fluoroscopic device and submitted for interpretation post-procedure. FLUOROSCOPY TIME:  Fluoroscopy Time:  2 minutes 3 seconds Radiation Exposure Index (if provided by the fluoroscopic device): 44.65 mGy Number of Acquired Spot Images: multiple fluoroscopic screen captures COMPARISON:  Intraoperative cholangiogram 07/10/2020 FINDINGS: A stone was extracted during the procedure. CBD normal caliber. Visualized intrahepatic biliary radicles are unremarkable. Cystic duct remnant was not opacified during the procedure. Small amount of contrast extravasation is seen at the gallbladder fossa, appears to be arising from accessory biliary duct which is faintly visualized; extravasation is also seen on the prior intraoperative cholangiogram in retrospect at the same site. No stricture or other filling defects identified. IMPRESSION: Small amount of contrast extravasation at the gallbladder fossa which appears to arise from a faintly visualized accessory duct, in retrospect unchanged from intraoperative cholangiogram. These images were submitted for radiologic interpretation only. Please see the procedural report for the amount of contrast and the fluoroscopy time utilized. Electronically Signed   By: Ulyses Southward M.D.   On: 07/11/2020 16:09   MR ABDOMEN MRCP W WO CONTAST  Result Date: 07/08/2020 CLINICAL DATA:  19 year old female with history of right upper quadrant abdominal pain with elevated liver enzymes. Suspected biliary tract obstruction and gallstone pancreatitis. EXAM: MRI ABDOMEN WITHOUT AND WITH CONTRAST (INCLUDING MRCP) TECHNIQUE: Multiplanar multisequence MR imaging of the abdomen was performed both before and after the administration of intravenous contrast. Heavily T2-weighted images of the biliary and pancreatic ducts were obtained, and three-dimensional MRCP images were rendered by post processing. CONTRAST:  7mL  GADAVIST GADOBUTROL 1 MMOL/ML IV SOLN COMPARISON:  No prior abdominal MRI. CT the abdomen and pelvis 07/08/2020. FINDINGS: Lower chest: Unremarkable. Hepatobiliary: No suspicious cystic or solid hepatic lesions. Small filling defects are noted near the fundus of the gallbladder, compatible with tiny gallstones. Gallbladder does not appear distended. There is no gallbladder wall thickening or pericholecystic fluid. No intra or extrahepatic  biliary ductal dilatation noted on MRCP images. Common bile duct measures 4 mm in the porta hepatis. No filling defects in the common bile duct to suggest choledocholithiasis. Pancreas: No pancreatic mass. No pancreatic ductal dilatation. No pancreatic or peripancreatic fluid collections or inflammatory changes. Spleen:  Unremarkable. Adrenals/Urinary Tract: Bilateral kidneys and adrenal glands are normal in appearance. No hydroureteronephrosis in the visualized portions of the abdomen. Stomach/Bowel: Visualized portions are unremarkable. Vascular/Lymphatic: No aneurysm identified in the visualized abdominal vasculature. No lymphadenopathy noted in the abdomen. Other: No significant volume of ascites noted in the visualized portions of the peritoneal cavity. Musculoskeletal: No aggressive appearing osseous lesions are noted in visualized portions of the peritoneal cavity. IMPRESSION: 1. Cholelithiasis without evidence of acute cholecystitis. 2. No evidence of choledocholithiasis. No findings of biliary tract obstruction. 3. No imaging findings to suggest an acute pancreatitis at this time. Electronically Signed   By: Trudie Reed M.D.   On: 07/08/2020 15:13   US Abdomen Limited RUQ  Result Date: 07/08/2020 CLINICAL DATA:  Right upper quadrant pain EXAM: ULTRASOUND ABDOMEN LIMITED RIGHT UPPER QUADRANT COMPARISON:  CT abdomen and pelvis July 08, 2020; abdominal ultrasound December 28, 2019 FINDINGS: Gallbladder: Within the gallbladder, there are echogenic foci which move and  shadow consistent with cholelithiasis. Gallbladder wall is borderline thickened without edema. No pericholecystic fluid. No sonographic Murphy sign noted by sonographer. Common bile duct: Diameter: 3 mm. No intrahepatic or extrahepatic biliary duct dilatation. Liver: No focal lesion identified. Within normal limits in parenchymal echogenicity. Portal vein is patent on color Doppler imaging with normal direction of blood flow towards the liver. Other: None. IMPRESSION: Cholelithiasis with borderline gallbladder wall thickening. These findings could be indicative of early acute cholecystitis. In this regard, it may be prudent to correlate with nuclear medicine hepatobiliary imaging study to assess for cystic duct patency. Study otherwise unremarkable. Electronically Signed   By: Bretta Bang III M.D.   On: 07/08/2020 08:15        Discharge Exam: Vitals:   07/12/20 0109 07/12/20 0517  BP: (!) 110/51 129/85  Pulse: 61 65  Resp: 18 16  Temp: 98.4 F (36.9 C)   SpO2: 100% 98%   Vitals:   07/11/20 1908 07/11/20 2100 07/12/20 0109 07/12/20 0517  BP: 120/71 116/69 (!) 110/51 129/85  Pulse: 62 62 61 65  Resp: 18 16 18 16   Temp: 98.7 F (37.1 C) 98.5 F (36.9 C) 98.4 F (36.9 C)   TempSrc: Oral  Oral   SpO2: 99% 98% 100% 98%  Weight:      Height:        General: Pt is alert, awake, not in acute distress Cardiovascular: RRR, S1/S2 +, no rubs, no gallops Respiratory: CTA bilaterally, no wheezing, no rhonchi Abdominal: Soft, epigastric and RUQ tender, ND, bowel sounds + Extremities: no edema, no cyanosis   The results of significant diagnostics from this hospitalization (including imaging, microbiology, ancillary and laboratory) are listed below for reference.    Significant Diagnostic Studies: DG Cholangiogram Operative  Result Date: 07/10/2020 CLINICAL DATA:  19 year old female with history of gallstone pancreatitis. EXAM: INTRAOPERATIVE CHOLANGIOGRAM TECHNIQUE: Cholangiographic  images from the C-arm fluoroscopic device were submitted for interpretation post-operatively. Please see the procedural report for the amount of contrast and the fluoroscopy time utilized. COMPARISON:  MRCP from 07/08/2020 FINDINGS: Single fluoroscopic image store from intraoperative cholangiogram demonstrates injection into the gallbladder lumen with patent cystic duct. There is mild dilation of the common bile duct and no significant dilation of the  intrahepatic biliary tree. On the images provided, contrast is not visualized spilling into the duodenum and there is mild irregularity of the distal common bile duct. IMPRESSION: No definite evidence of choledocholithiasis, however contrast is not visualized spilling into the duodenum through the ampulla of Vater. Marliss Coots, MD [facility] Radiology Electronically Signed   By: Marliss Coots MD   On: 07/10/2020 09:37   CT ABDOMEN PELVIS W CONTRAST  Result Date: 07/08/2020 CLINICAL DATA:  Epigastric pain. EXAM: CT ABDOMEN AND PELVIS WITH CONTRAST TECHNIQUE: Multidetector CT imaging of the abdomen and pelvis was performed using the standard protocol following bolus administration of intravenous contrast. CONTRAST:  OMNIPAQUE IOHEXOL 300 MG/ML  SOLN COMPARISON:  None. FINDINGS: Lower chest: Unremarkable. Hepatobiliary: No suspicious focal abnormality within the liver parenchyma. Gallbladder is nondistended with ill definition of the gallbladder wall and potential mild gallbladder wall thickening. No intrahepatic or extrahepatic biliary dilation. Pancreas: No focal mass lesion. No dilatation of the main duct. No intraparenchymal cyst. No peripancreatic edema. Spleen: No splenomegaly. No focal mass lesion. Adrenals/Urinary Tract: No adrenal nodule or mass. Kidneys unremarkable. No evidence for hydroureter. The urinary bladder appears normal for the degree of distention. Stomach/Bowel: Stomach is unremarkable. No  gastric wall thickening. No evidence of outlet obstruction. No perigastric edema or inflammation. Duodenum is normally positioned as is the ligament of Treitz. No small bowel wall thickening. No small bowel dilatation. The terminal ileum is normal. The appendix is normal. No gross colonic mass. No colonic wall thickening. Vascular/Lymphatic: No abdominal aortic aneurysm. No abdominal aortic atherosclerotic calcification. There is no gastrohepatic or hepatoduodenal ligament lymphadenopathy. No retroperitoneal or mesenteric lymphadenopathy. No pelvic sidewall lymphadenopathy. Reproductive: The uterus is unremarkable.  There is no adnexal mass. Other: No intraperitoneal free fluid. Musculoskeletal: No worrisome lytic or sclerotic osseous abnormality. IMPRESSION: 1. Gallbladder is nondistended with ill definition of the gallbladder wall and potential mild gallbladder wall thickening. No gallstones by CT. Right upper quadrant ultrasound may prove helpful to further evaluate as clinically warranted. 2. Otherwise unremarkable exam. Electronically Signed   By: Kennith Center M.D.   On: 07/08/2020 05:55   MR 3D Recon At Scanner  Result Date: 07/08/2020 CLINICAL DATA:  19 year old female with history of right upper quadrant abdominal pain with elevated liver enzymes. Suspected biliary tract obstruction and gallstone pancreatitis. EXAM: MRI ABDOMEN WITHOUT AND WITH CONTRAST (INCLUDING MRCP) TECHNIQUE: Multiplanar multisequence MR imaging of the abdomen was performed both before and after the administration of intravenous contrast. Heavily T2-weighted images of the biliary and pancreatic ducts were obtained, and three-dimensional MRCP images were rendered by post processing. CONTRAST:  7mL GADAVIST GADOBUTROL 1 MMOL/ML IV SOLN COMPARISON:  No prior abdominal MRI. CT the abdomen and pelvis 07/08/2020. FINDINGS: Lower chest: Unremarkable. Hepatobiliary: No suspicious cystic or solid hepatic lesions. Small filling defects are  noted near the fundus of the gallbladder, compatible with tiny gallstones. Gallbladder does not appear distended. There is no gallbladder wall thickening or pericholecystic fluid. No intra or extrahepatic biliary ductal dilatation noted on MRCP images. Common bile duct measures 4 mm in the porta hepatis. No filling defects in the common bile duct to suggest choledocholithiasis. Pancreas: No pancreatic mass. No pancreatic ductal dilatation. No pancreatic or peripancreatic fluid collections or inflammatory changes. Spleen:  Unremarkable. Adrenals/Urinary Tract: Bilateral kidneys and adrenal glands are normal in appearance. No hydroureteronephrosis in the visualized portions of the abdomen. Stomach/Bowel: Visualized portions are unremarkable. Vascular/Lymphatic: No aneurysm identified in the visualized abdominal vasculature. No  lymphadenopathy noted in the abdomen. Other: No significant volume of ascites noted in the visualized portions of the peritoneal cavity. Musculoskeletal: No aggressive appearing osseous lesions are noted in visualized portions of the peritoneal cavity. IMPRESSION: 1. Cholelithiasis without evidence of acute cholecystitis. 2. No evidence of choledocholithiasis. No findings of biliary tract obstruction. 3. No imaging findings to suggest an acute pancreatitis at this time. Electronically Signed   By: Trudie Reed M.D.   On: 07/08/2020 15:13   DG ERCP BILIARY & PANCREATIC DUCTS  Result Date: 07/11/2020 CLINICAL DATA:  Acute biliary pancreatitis, CBD stone EXAM: ERCP WITH SPHINCTEROTOMY AND STONE BASKET EXTRACTION OF A CBD STONE TECHNIQUE: Multiple spot images obtained with the fluoroscopic device and submitted for interpretation post-procedure. FLUOROSCOPY TIME:  Fluoroscopy Time:  2 minutes 3 seconds Radiation Exposure Index (if provided by the fluoroscopic device): 44.65 mGy Number of Acquired Spot Images: multiple fluoroscopic screen captures COMPARISON:  Intraoperative cholangiogram  07/10/2020 FINDINGS: A stone was extracted during the procedure. CBD normal caliber. Visualized intrahepatic biliary radicles are unremarkable. Cystic duct remnant was not opacified during the procedure. Small amount of contrast extravasation is seen at the gallbladder fossa, appears to be arising from accessory biliary duct which is faintly visualized; extravasation is also seen on the prior intraoperative cholangiogram in retrospect at the same site. No stricture or other filling defects identified. IMPRESSION: Small amount of contrast extravasation at the gallbladder fossa which appears to arise from a faintly visualized accessory duct, in retrospect unchanged from intraoperative cholangiogram. These images were submitted for radiologic interpretation only. Please see the procedural report for the amount of contrast and the fluoroscopy time utilized. Electronically Signed   By: Ulyses Southward M.D.   On: 07/11/2020 16:09   MR ABDOMEN MRCP W WO CONTAST  Result Date: 07/08/2020 CLINICAL DATA:  19 year old female with history of right upper quadrant abdominal pain with elevated liver enzymes. Suspected biliary tract obstruction and gallstone pancreatitis. EXAM: MRI ABDOMEN WITHOUT AND WITH CONTRAST (INCLUDING MRCP) TECHNIQUE: Multiplanar multisequence MR imaging of the abdomen was performed both before and after the administration of intravenous contrast. Heavily T2-weighted images of the biliary and pancreatic ducts were obtained, and three-dimensional MRCP images were rendered by post processing. CONTRAST:  52mL GADAVIST GADOBUTROL 1 MMOL/ML IV SOLN COMPARISON:  No prior abdominal MRI. CT the abdomen and pelvis 07/08/2020. FINDINGS: Lower chest: Unremarkable. Hepatobiliary: No suspicious cystic or solid hepatic lesions. Small filling defects are noted near the fundus of the gallbladder, compatible with tiny gallstones. Gallbladder does not appear distended. There is no gallbladder wall thickening or pericholecystic  fluid. No intra or extrahepatic biliary ductal dilatation noted on MRCP images. Common bile duct measures 4 mm in the porta hepatis. No filling defects in the common bile duct to suggest choledocholithiasis. Pancreas: No pancreatic mass. No pancreatic ductal dilatation. No pancreatic or peripancreatic fluid collections or inflammatory changes. Spleen:  Unremarkable. Adrenals/Urinary Tract: Bilateral kidneys and adrenal glands are normal in appearance. No hydroureteronephrosis in the visualized portions of the abdomen. Stomach/Bowel: Visualized portions are unremarkable. Vascular/Lymphatic: No aneurysm identified in the visualized abdominal vasculature. No lymphadenopathy noted in the abdomen. Other: No significant volume of ascites noted in the visualized portions of the peritoneal cavity. Musculoskeletal: No aggressive appearing osseous lesions are noted in visualized portions of the peritoneal cavity. IMPRESSION: 1. Cholelithiasis without evidence of acute cholecystitis. 2. No evidence of choledocholithiasis. No findings of biliary tract obstruction. 3. No imaging findings to suggest an acute pancreatitis at this time. Electronically Signed  By: Trudie Reed M.D.   On: 07/08/2020 15:13   US Abdomen Limited RUQ  Result Date: 07/08/2020 CLINICAL DATA:  Right upper quadrant pain EXAM: ULTRASOUND ABDOMEN LIMITED RIGHT UPPER QUADRANT COMPARISON:  CT abdomen and pelvis July 08, 2020; abdominal ultrasound December 28, 2019 FINDINGS: Gallbladder: Within the gallbladder, there are echogenic foci which move and shadow consistent with cholelithiasis. Gallbladder wall is borderline thickened without edema. No pericholecystic fluid. No sonographic Murphy sign noted by sonographer. Common bile duct: Diameter: 3 mm. No intrahepatic or extrahepatic biliary duct dilatation. Liver: No focal lesion identified. Within normal limits in parenchymal echogenicity. Portal vein is patent on color Doppler imaging with normal  direction of blood flow towards the liver. Other: None. IMPRESSION: Cholelithiasis with borderline gallbladder wall thickening. These findings could be indicative of early acute cholecystitis. In this regard, it may be prudent to correlate with nuclear medicine hepatobiliary imaging study to assess for cystic duct patency. Study otherwise unremarkable. Electronically Signed   By: Bretta Bang III M.D.   On: 07/08/2020 08:15     Microbiology: Recent Results (from the past 240 hour(s))  SARS CORONAVIRUS 2 ([facility] 6-24 HRS) Nasopharyngeal Nasopharyngeal Swab     Status: None   Collection Time: 07/07/20  9:42 AM   Specimen: Nasopharyngeal Swab  Result Value Ref Range Status   SARS Coronavirus 2 NEGATIVE NEGATIVE Final    Comment: (NOTE) SARS-CoV-2 target nucleic acids are NOT DETECTED.  The SARS-CoV-2 RNA is generally detectable in upper and lower respiratory specimens during the acute phase of infection. Negative results do not preclude SARS-CoV-2 infection, do not rule out co-infections with other pathogens, and should not be used as the sole basis for treatment or other patient management decisions. Negative results must be combined with clinical observations, patient history, and epidemiological information. The expected result is Negative.  Fact Sheet for Patients: HairSlick.no  Fact Sheet for Healthcare Providers: quierodirigir.com  This test is not yet approved or cleared by the [place] FDA and  has been authorized for detection and/or diagnosis of SARS-CoV-2 by FDA under an Emergency Use Authorization (EUA). This EUA will remain  in effect (meaning this test can be used) for the duration of the COVID-19 declaration under Se ction 564(b)(1) of the Act, 21 U.S.C. section 360bbb-3(b)(1), unless the authorization is terminated or revoked sooner.  Performed at [facility], [address] [place],  [place] [address]   Respiratory Panel by RT PCR (Flu A&B, Covid) - Nasopharyngeal Swab     Status: None   Collection Time: 07/08/20  4:15 AM   Specimen: Nasopharyngeal Swab  Result Value Ref Range Status   SARS Coronavirus 2 by RT PCR NEGATIVE NEGATIVE Final    Comment: (NOTE) SARS-CoV-2 target nucleic acids are NOT DETECTED.  The SARS-CoV-2 RNA is generally detectable in upper respiratoy specimens during the acute phase of infection. The lowest concentration of SARS-CoV-2 viral copies this assay can detect is 131 copies/mL. A negative result does not preclude SARS-Cov-2 infection and should not be used as the sole basis for treatment or other patient management decisions. A negative result may occur with  improper specimen collection/handling, submission of specimen other than nasopharyngeal swab, presence of viral mutation(s) within the areas targeted by this assay, and inadequate number of viral copies (<131 copies/mL). A negative result must be combined with clinical observations, patient history, and epidemiological information. The expected result is Negative.  Fact Sheet for Patients:  https://www.moore.com/  Fact Sheet for Healthcare Providers:  https://www.young.biz/  This test is no t yet approved or cleared by the [place] and  has been authorized for detection and/or diagnosis of SARS-CoV-2 by FDA under an Emergency Use Authorization (EUA). This EUA will remain  in effect (meaning this test can be used) for the duration of the COVID-19 declaration under Section 564(b)(1) of the Act, 21 U.S.C. section 360bbb-3(b)(1), unless the authorization is terminated or revoked sooner.     Influenza A by PCR NEGATIVE NEGATIVE Final   Influenza B by PCR NEGATIVE NEGATIVE Final    Comment: (NOTE) The Xpert Xpress SARS-CoV-2/FLU/RSV assay is intended as an aid in  the diagnosis of influenza from Nasopharyngeal swab specimens and  should  not be used as a sole basis for treatment. Nasal washings and  aspirates are unacceptable for Xpert Xpress SARS-CoV-2/FLU/RSV  testing.  Fact Sheet for Patients: https://www.moore.com/  Fact Sheet for Healthcare Providers: https://www.young.biz/  This test is not yet approved or cleared by the [place] FDA and  has been authorized for detection and/or diagnosis of SARS-CoV-2 by  FDA under an Emergency Use Authorization (EUA). This EUA will remain  in effect (meaning this test can be used) for the duration of the  Covid-19 declaration under Section 564(b)(1) of the Act, 21  U.S.C. section 360bbb-3(b)(1), unless the authorization is  terminated or revoked. Performed at [facility], [address], [place], [place] [address]   Nasopharyngeal Culture     Status: None   Collection Time: 07/10/20  5:40 AM   Specimen: Nasal Mucosa; Nasopharyngeal  Result Value Ref Range Status   Specimen Description   Final    NASOPHARYNGEAL Performed at [facility], [address] [place], [place] [address]    Special Requests   Final    NONE Performed at [facility], [address] [place], [place] [address]    Culture   Final    Normal respiratory flora-no Staph aureus or Pseudomonas seen Performed at [facility], [address], [place], [place] [address]    Report Status 07/12/2020 FINAL  Final     Labs: Basic Metabolic Panel: Recent Labs  Lab 07/08/20 0240 07/08/20 0240 07/09/20 0546 07/09/20 0546 07/10/20 0607 07/10/20 0607 07/11/20 0717 07/12/20 0645  NA 136  --  136  --  136  --  138 138  K 3.4*   < > 4.0   < > 3.6   < > 3.7 3.7  CL 104  --  105  --  104  --  104 106  CO2 23  --  24  --  24  --  27 24  GLUCOSE 98  --  81  --  90  --  101* 92  BUN 5*  --  5*  --  5*  --  6 6  CREATININE 0.58  --  0.58  --  0.60  --  0.54 0.58  CALCIUM 8.9  --  8.8*  --  9.1  --  8.9 8.6*  MG 2.2  --  2.0  --  1.9  --  1.9 1.9   < > =  values in this interval not displayed.   Liver Function Tests: Recent Labs  Lab 07/08/20 0240 07/09/20 0546 07/10/20 0607 07/11/20 0717 07/12/20 0645  AST 234* 65* 68*  67* 56* 35  ALT 330* 206* 180*  181* 169* 112*  ALKPHOS 187* 142* 211*  214* 163* 123  BILITOT 2.6* 1.0 0.6  0.7 0.7 1.0  PROT 6.8 6.0* 6.5  6.4* 6.1* 5.8*  ALBUMIN 4.0 3.2* 3.5  3.5 3.3* 3.1*   Recent Labs  Lab 07/08/20 0240 07/09/20 0546 07/10/20 0607 07/11/20 0717 07/12/20 0645  LIPASE 301* 43 49 32 30   No results for input(s): AMMONIA in the last 168 hours. CBC: Recent Labs  Lab 07/08/20 0240 07/09/20 0546 07/10/20 0607 07/11/20 0717 07/12/20 0745  WBC 9.2 5.7 6.4 9.7 7.3  NEUTROABS 5.8 2.9 3.2 7.2 4.9  HGB 13.7 12.0 12.6 11.6* 10.8*  HCT 39.8 36.5 37.3 34.8* 32.7*  MCV 78.7* 80.2 79.5* 80.9 79.6*  PLT 316 245 277 263 236   Cardiac Enzymes: No results for input(s): CKTOTAL, CKMB, CKMBINDEX, TROPONINI in the last 168 hours. BNP: Invalid input(s): POCBNP CBG: No results for input(s): GLUCAP in the last 168 hours.  Time coordinating discharge:  36 minutes  Signed:  Catarina Hartshorn, DO [facility] Pager: [phone] 07/12/2020, 2:01 PM

## 2020-07-12 NOTE — Progress Notes (Signed)
Maylon Peppers, M.D. Gastroenterology & Hepatology   Interval History:  Patient reports she was able to tolerate some clear liquids yesterday but still not feeling much appetite.  States that she is still presenting some epigastric pain goes to her right shoulder and some right upper quadrant.  Has not presented any fever, chills, nausea or vomiting. Underwent ERCP yesterday. The patient was found to have spastic pylorus which was dilated with a balloon.  There was a small filling defect distally in the CBD which was swept with a balloon after the sphincterotomy was performed.  There was also presence of biliary leak secondary to duct of Luschka, no stent was placed.  Inpatient Medications:  Current Facility-Administered Medications:  .  [COMPLETED] 6 CHG cloth bath night before surgery, , , Once **AND** [COMPLETED] 6 CHG cloth bath AM of surgery, , , Once **AND** [COMPLETED] Chlorhexidine Gluconate Cloth 2 % PADS 6 each, 6 each, Topical, Once, 6 each at 07/09/20 2123 **AND** Chlorhexidine Gluconate Cloth 2 % PADS 6 each, 6 each, Topical, Once, Aviva Signs, MD .  ciprofloxacin (CIPRO) IVPB 400 mg, 400 mg, Intravenous, Q12H, Rehman, Najeeb U, MD, Last Rate: 200 mL/hr at 07/12/20 0413, 400 mg at 07/12/20 0413 .  HYDROcodone-acetaminophen (NORCO/VICODIN) 5-325 MG per tablet 1-2 tablet, 1-2 tablet, Oral, Q4H PRN, Rogene Houston, MD, 2 tablet at 07/12/20 0609 .  HYDROmorphone (DILAUDID) injection 1 mg, 1 mg, Intravenous, Q2H PRN, Rehman, Najeeb U, MD, 1 mg at 07/11/20 0821 .  [COMPLETED] ketorolac (TORADOL) 30 MG/ML injection 30 mg, 30 mg, Intravenous, Q6H, 30 mg at 07/10/20 1224 **FOLLOWED BY** ketorolac (TORADOL) 30 MG/ML injection 30 mg, 30 mg, Intravenous, Q6H PRN, Rehman, Najeeb U, MD, 30 mg at 07/12/20 0420 .  lactated ringers infusion, , Intravenous, Continuous, Rehman, Najeeb U, MD, Last Rate: 75 mL/hr at 07/10/20 1655, Restarted at 07/11/20 1415 .  LORazepam (ATIVAN) injection 1 mg, 1 mg,  Intravenous, Q4H PRN, Rehman, Najeeb U, MD .  ondansetron (ZOFRAN) tablet 4 mg, 4 mg, Oral, Q6H PRN **OR** ondansetron (ZOFRAN) injection 4 mg, 4 mg, Intravenous, Q6H PRN, Rehman, Najeeb U, MD, 4 mg at 07/10/20 1310 .  pantoprazole (PROTONIX) injection 40 mg, 40 mg, Intravenous, Q24H, Rehman, Najeeb U, MD, 40 mg at 07/11/20 1159 .  simethicone (MYLICON) chewable tablet 40 mg, 40 mg, Oral, Q6H PRN, Rogene Houston, MD   I/O    Intake/Output Summary (Last 24 hours) at 07/12/2020 0939 Last data filed at 07/12/2020 0300 Gross per 24 hour  Intake 1969.86 ml  Output 0 ml  Net 1969.86 ml     Physical Exam: Temp:  [97.5 F (36.4 C)-98.7 F (37.1 C)] 98.5 F (36.9 C) (10/08 2100) Pulse Rate:  [62-133] 65 (10/09 0517) Resp:  [15-21] 16 (10/09 0517) BP: (109-129)/(69-85) 129/85 (10/09 0517) SpO2:  [92 %-99 %] 98 % (10/09 0517)  Temp (24hrs), Avg:98.2 F (36.8 C), Min:97.5 F (36.4 C), Max:98.7 F (37.1 C) GENERAL: The patient is AO x3, in no acute distress. HEENT: Head is normocephalic and atraumatic. EOMI are intact. Mouth is well hydrated and without lesions. NECK: Supple. No masses LUNGS: Clear to auscultation. No presence of rhonchi/wheezing/rales. Adequate chest expansion HEART: RRR, normal s1 and s2. ABDOMEN: Soft, nontender, no guarding, no peritoneal signs, and nondistended. BS +. No masses. EXTREMITIES: Without any cyanosis, clubbing, rash, lesions or edema. NEUROLOGIC: AOx3, no focal motor deficit. SKIN: no jaundice, no rashes  Laboratory Data: CBC:     Component Value Date/Time   WBC 7.3  07/12/2020 0745   RBC 4.11 07/12/2020 0745   HGB 10.8 (L) 07/12/2020 0745   HCT 32.7 (L) 07/12/2020 0745   PLT 236 07/12/2020 0745   MCV 79.6 (L) 07/12/2020 0745   MCH 26.3 07/12/2020 0745   MCHC 33.0 07/12/2020 0745   RDW 12.2 07/12/2020 0745   LYMPHSABS 1.6 07/12/2020 0745   MONOABS 0.8 07/12/2020 0745   EOSABS 0.0 07/12/2020 0745   BASOSABS 0.0 07/12/2020 0745   COAG: No  results found for: INR, PROTIME  BMP:  BMP Latest Ref Rng & Units 07/12/2020 07/11/2020 07/10/2020  Glucose 70 - 99 mg/dL 92 101(H) 90  BUN 6 - 20 mg/dL 6 6 5(L)  Creatinine 0.44 - 1.00 mg/dL 0.58 0.54 0.60  Sodium 135 - 145 mmol/L 138 138 136  Potassium 3.5 - 5.1 mmol/L 3.7 3.7 3.6  Chloride 98 - 111 mmol/L 106 104 104  CO2 22 - 32 mmol/L 24 27 24   Calcium 8.9 - 10.3 mg/dL 8.6(L) 8.9 9.1    HEPATIC:  Hepatic Function Latest Ref Rng & Units 07/12/2020 07/11/2020 07/10/2020  Total Protein 6.5 - 8.1 g/dL 5.8(L) 6.1(L) 6.5  Albumin 3.5 - 5.0 g/dL 3.1(L) 3.3(L) 3.5  AST 15 - 41 U/L 35 56(H) 68(H)  ALT 0 - 44 U/L 112(H) 169(H) 180(H)  Alk Phosphatase 38 - 126 U/L 123 163(H) 211(H)  Total Bilirubin 0.3 - 1.2 mg/dL 1.0 0.7 0.6  Bilirubin, Direct 0.0 - 0.2 mg/dL - - 0.2    CARDIAC: No results found for: CKTOTAL, CKMB, CKMBINDEX, TROPONINI    Imaging: I personally reviewed and interpreted the available labs, imaging and endoscopic files.   Assessment/Plan: 19 year old female with past medical history of GERD and anxiety, who came to the hospital after presenting recurrent episodes of nausea and vomiting, as well as worsening abdominal pain in her epigastric area radiated to her back.  Patient was found to have acute gallstone pancreatitis during current admission.  Initially, she did not have any imaging abnormalities concerning for choledocholithiasis but had elevated liver enzymes.  An MRCP was negative for any obstructing stone.  However, the patient underwent successful cholecystectomy, IOC was positive for filling defect distally. Pt underwent ERCP yesterday. The patient was found to have spastic pylorus which was dilated with a balloon.  There was a small filling defect distally in the CBD which was swept with a balloon after the sphincterotomy was performed.  There was also presence of biliary leak secondary to duct of Luschka, no stent was placed.  Patient has presented drop in her liver  function tests compared to yesterday and has not presented any leukocytosis.  At this point, diet can be advanced as tolerated.  Regarding her  duct of Luschka, I discussed with Dr. Collene Mares but no further intervention is warranted as sphincterotomy was performed and this will allow closure of this duct.  However, she will need to continue on ciprofloxacin coverage for 5 days total.  Patient is also presenting anemia but she is currently having her menses.  No overt gastrointestinal bleeding at the moment and she is not having any findings in her recent endoscopic procedure that would explain an upper GI source.  #Gallstone pancreatitis #Choledocholithiasis # Duct of Luschka #Mycrocytic anemia - Daily LFTs - Advance diet as tolerated, to be discussed with general surgery - Surgical care per general surgery - C/w ciprofloxacin 400 mg BID, will need to complete a 5 day course - Follow up in the GI office in 1-2 weeks  Quillian Quince  Jenetta Downer, MD Gastroenterology and Hepatology [facility]  Note: Occasional unusual wording and randomly placed punctuation marks may result from the use of speech recognition technology to transcribe this document

## 2020-07-14 ENCOUNTER — Encounter (HOSPITAL_COMMUNITY): Payer: Self-pay | Admitting: General Surgery

## 2020-07-14 NOTE — Addendum Note (Signed)
Addendum  created 07/14/20 0933 by Franco Nones, CRNA   Charge Capture section accepted

## 2020-07-15 ENCOUNTER — Encounter (HOSPITAL_COMMUNITY): Payer: Self-pay | Admitting: Internal Medicine

## 2020-07-16 ENCOUNTER — Ambulatory Visit: Payer: Commercial Managed Care - PPO | Admitting: Gastroenterology

## 2020-07-18 ENCOUNTER — Telehealth: Payer: Commercial Managed Care - PPO | Admitting: General Surgery

## 2020-07-31 ENCOUNTER — Ambulatory Visit (INDEPENDENT_AMBULATORY_CARE_PROVIDER_SITE_OTHER): Payer: Commercial Managed Care - PPO | Admitting: Gastroenterology

## 2020-07-31 ENCOUNTER — Telehealth (INDEPENDENT_AMBULATORY_CARE_PROVIDER_SITE_OTHER): Payer: Self-pay | Admitting: *Deleted

## 2020-07-31 NOTE — Telephone Encounter (Signed)
Patient was a no show for her office visit with Pricilla Larsson today 07/31/2020.

## 2020-09-11 ENCOUNTER — Encounter: Payer: Self-pay | Admitting: Gastroenterology

## 2020-09-11 ENCOUNTER — Ambulatory Visit (INDEPENDENT_AMBULATORY_CARE_PROVIDER_SITE_OTHER): Payer: Commercial Managed Care - PPO | Admitting: Gastroenterology

## 2020-09-11 DIAGNOSIS — F418 Other specified anxiety disorders: Secondary | ICD-10-CM | POA: Insufficient documentation

## 2020-09-11 DIAGNOSIS — K5900 Constipation, unspecified: Secondary | ICD-10-CM

## 2020-09-11 DIAGNOSIS — R1010 Upper abdominal pain, unspecified: Secondary | ICD-10-CM

## 2020-09-11 DIAGNOSIS — R112 Nausea with vomiting, unspecified: Secondary | ICD-10-CM | POA: Diagnosis not present

## 2020-09-11 DIAGNOSIS — K219 Gastro-esophageal reflux disease without esophagitis: Secondary | ICD-10-CM | POA: Diagnosis not present

## 2020-09-11 DIAGNOSIS — Z9049 Acquired absence of other specified parts of digestive tract: Secondary | ICD-10-CM | POA: Insufficient documentation

## 2020-09-11 DIAGNOSIS — R1011 Right upper quadrant pain: Secondary | ICD-10-CM

## 2020-09-11 MED ORDER — PANTOPRAZOLE SODIUM 40 MG PO TBEC: 40.0000 mg | DELAYED_RELEASE_TABLET | Freq: Two times a day (BID) | ORAL | 0 refills | Status: DC

## 2020-09-11 NOTE — Patient Instructions (Signed)
Please take Miralax 17 grams daily.  High-FODMAP Food List bowl of mixed legumes Katarina Lofgren / Maskot  The following foods have been identified as being high in FODMAPs:  Fruits Apples Apricots Blackberries Cherries Grapefruit Mango Nectarines Peaches Pears Plums and prunes Pomegranates Watermelon High concentration of fructose from canned fruit, dried fruit, or fruit juice Grains Barley Couscous? Farro Rye Semolina Wheat Lactose-Containing Foods These foods contain lactose, which is a FODMAP:  Buttermilk Cream Custard Ice cream Margarine Milk (cow, goat, sheep) Soft cheese, including cottage cheese and ricotta Yogurt (regular and [place]) Dairy Substitutes Oat milk (although a 1/8 serving is considered low-FODMAP) Soy milk (U.S.) Legumes Baked beans Black-eyed peas Butter beans Chickpeas Lentils Kidney beans Lima beans Soybeans Split peas Sweeteners Agave Fructose High fructose corn syrup Honey Isomalt Maltitol Mannitol Molasses Sorbitol Xylitol Vegetables Artichokes Asparagus Beets Brussels sprouts Cauliflower Celery Garlic Leeks Mushrooms Okra Onions Peas Scallions (white parts) Shallots Snow peas Sugar snap peas

## 2020-09-12 NOTE — Progress Notes (Signed)
Vonda Antigua, MD [address]  [place]  [place], [place] [address]  Main: [phone]  Fax: [phone]   Primary Care Physician: Cari Caraway, MD   Chief Complaint  Patient presents with  . Abdominal Pain    HPI: Annette Gould is a 19 y.o. female here for follow-up of abdominal pain, bloating.  Patient recently was admitted and found to have gallbladder wall thickening and gallstone pancreatitis in October 2020.  She underwent cholecystectomy, with filling defect seen on intraoperative cholangiogram.  She underwent ERCP which showed a spastic pylorus which was dilated with the balloon, filling defect in the CBD was swept with a balloon sphincterotomy was performed.  Presence of biliary leak was seen secondary to duct of Luschka, no stent was placed.  I personally reviewed her procedure and GI notes from that admission and as per their notes "Regarding her  duct of Luschka, I discussed with Dr. Collene Mares but no further intervention is warranted as sphincterotomy was performed and this will allow closure of this duct.  However, she will need to continue on ciprofloxacin coverage for 5 days total."  Patient continues to report abdominal pain despite her cholecystectomy.  Reports her bowel movements vary between constipation followed by loose bowel movements after 2 to 3 days of constipation.  No blood in her stool.  Current Outpatient Medications  Medication Sig Dispense Refill  . AUROVELA FE 1.5/30 1.5-30 MG-MCG tablet Take 1 tablet by mouth daily.    . propranolol (INDERAL) 10 MG tablet Take 10 mg by mouth daily as needed (If blood pressure rises).     . sertraline (ZOLOFT) 50 MG tablet Take 75 mg by mouth daily.    . ondansetron (ZOFRAN ODT) 4 MG disintegrating tablet Take 1 tablet (4 mg total) by mouth every 8 (eight) hours as needed. (Patient not taking: Reported on 09/11/2020) 10 tablet 1  . pantoprazole (PROTONIX) 40 MG tablet Take 1 tablet (40 mg total) by mouth 2  (two) times daily. 60 tablet 0   No current facility-administered medications for this visit.    Allergies as of 09/11/2020 - Review Complete 09/11/2020  Allergen Reaction Noted  . Peanut-containing drug products Hives and Swelling 04/10/2020    ROS:  General: Negative for anorexia, weight loss, fever, chills, fatigue, weakness. ENT: Negative for hoarseness, difficulty swallowing , nasal congestion. CV: Negative for chest pain, angina, palpitations, dyspnea on exertion, peripheral edema.  Respiratory: Negative for dyspnea at rest, dyspnea on exertion, cough, sputum, wheezing.  GI: See history of present illness. GU:  Negative for dysuria, hematuria, urinary incontinence, urinary frequency, nocturnal urination.  Endo: Negative for unusual weight change.    Physical Examination:   BP (!) 151/82   Pulse 78   Temp 98.4 F (36.9 C) (Oral)   Ht _0  (1.575 m)   Wt 140 lb (63.5 kg)   BMI 25.61 kg/m   General: Well-nourished, well-developed in no acute distress.  Eyes: No icterus. Conjunctivae pink. Mouth: Oropharyngeal mucosa moist and pink , no lesions erythema or exudate. Neck: Supple, Trachea midline Abdomen: Bowel sounds are normal, nontender, nondistended, no hepatosplenomegaly or masses, no abdominal bruits or hernia , no rebound or guarding.   Extremities: No lower extremity edema. No clubbing or deformities. Neuro: Alert and oriented x 3.  Grossly intact. Skin: Warm and dry, no jaundice.   Psych: Alert and cooperative, normal mood and affect.   Labs: CMP     Component Value Date/Time   NA 139 09/11/2020  1532   K 4.1 09/11/2020 1532   CL 104 09/11/2020 1532   CO2 21 09/11/2020 1532   GLUCOSE 81 09/11/2020 1532   GLUCOSE 92 07/12/2020 0645   BUN 6 09/11/2020 1532   CREATININE 0.82 09/11/2020 1532   CALCIUM 9.6 09/11/2020 1532   PROT 7.3 09/11/2020 1532   ALBUMIN 4.5 09/11/2020 1532   AST 12 09/11/2020 1532   ALT 10 09/11/2020 1532   ALKPHOS 67 09/11/2020  1532   BILITOT 0.4 09/11/2020 1532   GFRNONAA 103 09/11/2020 1532   GFRNONAA >60 07/12/2020 0645   GFRAA 119 09/11/2020 1532   Lab Results  Component Value Date   WBC 6.0 09/11/2020   HGB 14.2 09/11/2020   HCT 41.7 09/11/2020   MCV 79 09/11/2020   PLT 310 09/11/2020    Imaging Studies: No results found.  Assessment and Plan:   Annette Gould is a 19 y.o. y/o female here for follow-up of abdominal pain with recent cholecystectomy for cholecystitis, gallstone pancreatitis with ERCP done for filling defect in the CBD, sphincterotomy performed  Patient continues to report abdominal pain despite cholecystectomy  I discussed with her, that despite treatment for cholecystitis, she continues to have pain which is present well before the cholecystitis as well, and therefore with her other reassuring work-up, she likely has component of functional symptoms.  I encouraged her to work with her PCP in regard to treatment or referral for her anxiety and stressors as this can help with her symptoms and she verbalized understanding.  I repeated her liver enzymes today and they have normalized since her October admission  She was also found to be anemic on that admission and I repeated labs today and they are normal as well  She does report increase in abdominal symptoms after eating gluten products.  Her duodenal biopsies were negative for any evidence of celiac disease.  Will obtain celiac HLA testing to rule it out  I will increase her PPI to twice daily for short-term to see if it helps with her symptoms as well  (Risks of PPI use were discussed with patient including bone loss, C. Diff diarrhea, pneumonia, infections, CKD, electrolyte abnormalities.  Pt. Verbalizes understanding and chooses to continue the medication.)  Given her constipation for 2 to 3 days followed by loose stools, some of her symptoms are likely coming from constipation as well.  High-fiber diet MiraLAX daily with goal  of 1-2 soft bowel movements daily.  If not at goal, patient instructed to increase dose to twice daily.  If loose stools with the medication, patient asked to decrease the medication to every other day, or half dose daily.  Patient verbalized understanding    Dr Vonda Antigua

## 2020-09-14 ENCOUNTER — Encounter (HOSPITAL_COMMUNITY): Payer: Self-pay | Admitting: Emergency Medicine

## 2020-09-14 ENCOUNTER — Other Ambulatory Visit: Payer: Self-pay

## 2020-09-14 ENCOUNTER — Emergency Department (HOSPITAL_COMMUNITY)
Admission: EM | Admit: 2020-09-14 | Discharge: 2020-09-14 | Disposition: A | Discharge status: Home / Self Care | Payer: Commercial Managed Care - PPO | Attending: Emergency Medicine | Admitting: Emergency Medicine

## 2020-09-14 DIAGNOSIS — R1013 Epigastric pain: Secondary | ICD-10-CM | POA: Diagnosis not present

## 2020-09-14 DIAGNOSIS — R112 Nausea with vomiting, unspecified: Secondary | ICD-10-CM | POA: Insufficient documentation

## 2020-09-14 DIAGNOSIS — Z87891 Personal history of nicotine dependence: Secondary | ICD-10-CM | POA: Diagnosis not present

## 2020-09-14 DIAGNOSIS — K219 Gastro-esophageal reflux disease without esophagitis: Secondary | ICD-10-CM | POA: Diagnosis not present

## 2020-09-14 LAB — CBC
HCT: 41.8 % (ref 36.0–46.0)
Hemoglobin: 14.4 g/dL (ref 12.0–15.0)
MCH: 27 pg (ref 26.0–34.0)
MCHC: 34.4 g/dL (ref 30.0–36.0)
MCV: 78.3 fL — ABNORMAL LOW (ref 80.0–100.0)
Platelets: 297 10*3/uL (ref 150–400)
RBC: 5.34 MIL/uL — ABNORMAL HIGH (ref 3.87–5.11)
RDW: 12 % (ref 11.5–15.5)
WBC: 6.8 10*3/uL (ref 4.0–10.5)
nRBC: 0 % (ref 0.0–0.2)

## 2020-09-14 LAB — URINALYSIS, ROUTINE W REFLEX MICROSCOPIC
Bilirubin Urine: NEGATIVE
Glucose, UA: NEGATIVE mg/dL
Hgb urine dipstick: NEGATIVE
Ketones, ur: 5 mg/dL — AB
Leukocytes,Ua: NEGATIVE
Nitrite: NEGATIVE
Protein, ur: NEGATIVE mg/dL
Specific Gravity, Urine: 1.013 (ref 1.005–1.030)
pH: 8 (ref 5.0–8.0)

## 2020-09-14 LAB — COMPREHENSIVE METABOLIC PANEL
ALT: 11 U/L (ref 0–44)
AST: 15 U/L (ref 15–41)
Albumin: 4.6 g/dL (ref 3.5–5.0)
Alkaline Phosphatase: 55 U/L (ref 38–126)
Anion gap: 8 (ref 5–15)
BUN: 7 mg/dL (ref 6–20)
CO2: 23 mmol/L (ref 22–32)
Calcium: 9.5 mg/dL (ref 8.9–10.3)
Chloride: 104 mmol/L (ref 98–111)
Creatinine, Ser: 0.64 mg/dL (ref 0.44–1.00)
GFR, Estimated: >60 mL/min (ref >60)
Glucose, Bld: 100 mg/dL — ABNORMAL HIGH (ref 70–99)
Potassium: 3.6 mmol/L (ref 3.5–5.1)
Sodium: 135 mmol/L (ref 135–145)
Total Bilirubin: 0.3 mg/dL (ref 0.3–1.2)
Total Protein: 8 g/dL (ref 6.5–8.1)

## 2020-09-14 LAB — LIPASE, BLOOD: Lipase: 22 U/L (ref 11–51)

## 2020-09-14 LAB — POC URINE PREG, ED: Preg Test, Ur: NEGATIVE

## 2020-09-14 MED ORDER — ONDANSETRON 4 MG PO TBDP: 4.0000 mg | ORAL_TABLET | Freq: Three times a day (TID) | ORAL | 0 refills | Status: DC | PRN

## 2020-09-14 MED ORDER — DICYCLOMINE HCL 20 MG PO TABS: 20.0000 mg | ORAL_TABLET | Freq: Two times a day (BID) | ORAL | 0 refills | Status: DC

## 2020-09-14 MED ORDER — LIDOCAINE VISCOUS HCL 2 % MT SOLN
15.0000 mL | Freq: Once | OROMUCOSAL | Status: AC
  Administered 2020-09-14: 11:00:00 15 mL via ORAL
  Filled 2020-09-14: qty 15

## 2020-09-14 MED ORDER — ONDANSETRON HCL 4 MG/2ML IJ SOLN
4.0000 mg | Freq: Once | INTRAMUSCULAR | Status: AC | PRN
  Administered 2020-09-14: 10:00:00 4 mg via INTRAVENOUS
  Filled 2020-09-14: qty 2

## 2020-09-14 MED ORDER — ALUM & MAG HYDROXIDE-SIMETH 200-200-20 MG/5ML PO SUSP
30.0000 mL | Freq: Once | ORAL | Status: AC
  Administered 2020-09-14: 11:00:00 30 mL via ORAL
  Filled 2020-09-14: qty 30

## 2020-09-14 MED ORDER — SODIUM CHLORIDE 0.9 % IV BOLUS
1000.0000 mL | Freq: Once | INTRAVENOUS | Status: AC
  Administered 2020-09-14: 10:00:00 1000 mL via INTRAVENOUS

## 2020-09-14 NOTE — ED Triage Notes (Signed)
Patient c/o abd pain that radiates into back x3 days. Per patient pain is worse in left abd. Patient reports nausea, vomiting, and diarrhea. Denies any fevers or urinary symptoms.

## 2020-09-14 NOTE — Discharge Instructions (Addendum)
As discussed, all of your labs were reassuring today.  I am sending home with nausea medication and medication that could help improve your abdominal pain.  Please follow-up with your GI doctor tomorrow for further evaluation.  Return to the ER for new or worsening symptoms.

## 2020-09-14 NOTE — ED Notes (Signed)
Pt reports abd pain similar to her "pancreatitis"  She reports she is followed by Dr Oswaldo Milian [facility] in [place] and has not spoken to her regarding , nor the surgeon who preformed her gall bladder surgery   She appears in no distress

## 2020-09-14 NOTE — ED Notes (Signed)
Pt up for re-eval   PA aware everything is returned  Awaiting re-eval and dispo

## 2020-09-14 NOTE — ED Provider Notes (Signed)
[facility] EMERGENCY DEPARTMENT Provider Note   CSN: [id number] Arrival date & time: 09/14/20  0827     History Chief Complaint  Patient presents with  . Abdominal Pain    Annette Gould is a 19 y.o. female with a past medical history significant for GERD and migraine headaches who presents to the ED due to worsening upper abdominal pain x3 days.  Patient describes pain as a burning sensation that radiates below bilateral shoulder blades.  Abdominal pain associated with numerous episodes of nonbloody, nonbilious emesis.  Patient admits to an average of 4 episodes of emesis daily.  Denies fever and chills.  Patient states she has been taking her reflux medication with no relief.  Patient had a cholecystectomy on 07/11/2020.  Chart reviewed.  Patient saw Dr. Wende Neighborsahiliana with GI on 12/9 due to persistent abdominal pain who believed it could be related to a functional component.  Patient's PPI was increased to twice daily and patient was also given MiraLAX due to symptoms of constipation on 12/9. She has a history of pancreatitis. Denies alcohol use and chronic NSAIDs.  Patient denies any concerns for pregnancy last menstrual cycle was 11/28.  Patient denies associated vaginal and urinary symptoms.  No concern for STDs at this time.  Patient denies chest pain, shortness of breath, cough, sore throat, sick contacts.  History obtained from patient, mother, and past medical records. No interpreter used during encounter.      Past Medical History:  Diagnosis Date  . Bursitis    hips  . GERD (gastroesophageal reflux disease)   . Migraine headache    approx 2/month    Patient Active Problem List   Diagnosis Date Noted  . History of cholecystectomy 09/11/2020  . Other specified anxiety disorders 09/11/2020  . Choledocholithiasis   . Gallstone pancreatitis 07/08/2020  . Transaminitis 07/08/2020  . Hypokalemia 07/08/2020  . Hyperbilirubinemia 07/08/2020  . Generalized abdominal pain  01/01/2020  . Nausea with vomiting 01/01/2020  . GERD (gastroesophageal reflux disease) 01/01/2020  . Constipation 01/01/2020    Past Surgical History:  Procedure Laterality Date  . BIOPSY N/A 04/15/2020   Procedure: BIOPSY;  Surgeon: Pasty Spillersahiliani, Varnita B, MD;  [facility];  Service: Endoscopy;  Laterality: N/A;  . CHOLECYSTECTOMY N/A 07/10/2020   Procedure: LAPAROSCOPIC CHOLECYSTECTOMY WITH INTRAOPERATIVE CHOLANGIOGRAM;  Surgeon: Franky MachoJenkins, Mark, MD;  Location: [facility];  Service: General;  Laterality: N/A;  . DENTAL SURGERY    . ERCP N/A 07/11/2020   Procedure: ENDOSCOPIC RETROGRADE CHOLANGIOPANCREATOGRAPHY (ERCP);  Surgeon: Malissa Hippoehman, Najeeb U, MD;  Location: [facility];  Service: Endoscopy;  Laterality: N/A;  . ESOPHAGOGASTRODUODENOSCOPY (EGD) WITH PROPOFOL N/A 04/15/2020   Procedure: ESOPHAGOGASTRODUODENOSCOPY (EGD) WITH PROPOFOL;  Surgeon: Pasty Spillersahiliani, Varnita B, MD;  [facility];  Service: Endoscopy;  Laterality: N/A;  . SPHINCTEROTOMY N/A 07/11/2020   Procedure: SPHINCTEROTOMY PYLORICE CHANNEL DILALATION 15MM, NORMAL SIZE BILE DUCT;  Surgeon: Malissa Hippoehman, Najeeb U, MD;  Location: [facility];  Service: Endoscopy;  Laterality: N/A;  . STONE EXTRACTION WITH BASKET N/A 07/11/2020   Procedure: STONE EXTRACTION WITH WITH BALLOON;  Surgeon: Malissa Hippoehman, Najeeb U, MD;  Location: [facility];  Service: Endoscopy;  Laterality: N/A;  . TYMPANOSTOMY TUBE PLACEMENT       OB History    Gravida  0   Para  0   Term  0   Preterm  0   AB  0   Living  0     SAB  0  IAB  0   Ectopic  0   Multiple  0   Live Births  0           Family History  Problem Relation Age of Onset  . Gastric cancer Maternal Grandfather        "not sure where, but somewhere in the stomach area"  . Colon cancer Neg Hx     Social History   Tobacco Use  . Smoking status: Former Smoker    Quit date: 12/03/2019    Years since quitting: 0.7  . Smokeless tobacco: Never Used  .  Tobacco comment: Smoked less than 1 pack per week for about 3 months  Vaping Use  . Vaping Use: Never used  Substance Use Topics  . Alcohol use: Not Currently    Comment: may have 1-2 drinks/month. None currently.   . Drug use: Yes    Types: Marijuana    Home Medications Prior to Admission medications   Medication Sig Start Date End Date Taking? Authorizing Provider  AUROVELA FE 1.5/30 1.5-30 MG-MCG tablet Take 1 tablet by mouth daily. 11/12/19   [provider]  dicyclomine (BENTYL) 20 MG tablet Take 1 tablet (20 mg total) by mouth 2 (two) times daily. 09/14/20   Mannie Stabile, PA-C  ondansetron (ZOFRAN ODT) 4 MG disintegrating tablet Take 1 tablet (4 mg total) by mouth every 8 (eight) hours as needed. Patient not taking: Reported on 09/11/2020 05/21/20   Vanetta Mulders, MD  ondansetron (ZOFRAN ODT) 4 MG disintegrating tablet Take 1 tablet (4 mg total) by mouth every 8 (eight) hours as needed for nausea or vomiting. 09/14/20   Mannie Stabile, PA-C  pantoprazole (PROTONIX) 40 MG tablet Take 1 tablet (40 mg total) by mouth 2 (two) times daily. 09/11/20 10/11/20  Pasty Spillers, MD  propranolol (INDERAL) 10 MG tablet Take 10 mg by mouth daily as needed (If blood pressure rises).  11/12/19   [provider]  sertraline (ZOLOFT) 50 MG tablet Take 75 mg by mouth daily. 11/12/19   [provider]  famotidine (PEPCID) 20 MG tablet Take 1 tablet (20 mg total) by mouth 2 (two) times daily. 12/28/19 01/01/20  [name], PA-C    Allergies    Peanut-containing drug products  Review of Systems   Review of Systems  Constitutional: Negative for chills and fever.  Respiratory: Negative for cough and shortness of breath.   Cardiovascular: Negative for chest pain.  Gastrointestinal: Positive for abdominal pain, nausea and vomiting. Negative for diarrhea.  Musculoskeletal: Positive for back pain.  Neurological: Negative for headaches.  All other systems reviewed and  are negative.   Physical Exam Updated Vital Signs BP 115/65 (BP Location: Right Arm)   Pulse (!) 57   Temp 98.3 F (36.8 C) (Oral)   Resp 16   Ht 5\' 2"  (1.575 m)   Wt 63.5 kg   LMP 08/31/2020   SpO2 98%   BMI 25.61 kg/m   Physical Exam Vitals and nursing note reviewed.  Constitutional:      General: She is not in acute distress.    Appearance: She is not ill-appearing.  HENT:     Head: Normocephalic.     Mouth/Throat:     Comments: Dry mucous membranes. Eyes:     Pupils: Pupils are equal, round, and reactive to light.  Cardiovascular:     Rate and Rhythm: Normal rate and regular rhythm.     Pulses: Normal pulses.     Heart  sounds: Normal heart sounds. No murmur heard. No friction rub. No gallop.   Pulmonary:     Effort: Pulmonary effort is normal.     Breath sounds: Normal breath sounds.  Abdominal:     General: Abdomen is flat. Bowel sounds are normal. There is no distension.     Palpations: Abdomen is soft.     Tenderness: There is abdominal tenderness. There is guarding. There is no right CVA tenderness or left CVA tenderness.     Comments: Tenderness throughout epigastric region with voluntary guarding.  No tenderness in right lower quadrant.  Negative CVA tenderness bilaterally.  Musculoskeletal:     Cervical back: Neck supple.     Comments: Able to move all 4 extremities without difficulty  Skin:    General: Skin is warm and dry.  Neurological:     General: No focal deficit present.     Mental Status: She is alert.  Psychiatric:        Mood and Affect: Mood normal.        Behavior: Behavior normal.     ED Results / Procedures / Treatments   Labs (all labs ordered are listed, but only abnormal results are displayed) Labs Reviewed  COMPREHENSIVE METABOLIC PANEL - Abnormal; Notable for the following components:      Result Value   Glucose, Bld 100 (*)    All other components within normal limits  CBC - Abnormal; Notable for the following components:    RBC 5.34 (*)    MCV 78.3 (*)    All other components within normal limits  URINALYSIS, ROUTINE W REFLEX MICROSCOPIC - Abnormal; Notable for the following components:   APPearance HAZY (*)    Ketones, ur 5 (*)    All other components within normal limits  LIPASE, BLOOD  POC URINE PREG, ED    EKG None  Radiology No results found.  Procedures Procedures (including critical care time)  Medications Ordered in ED Medications  ondansetron (ZOFRAN) injection 4 mg (4 mg Intravenous Given 09/14/20 0951)  sodium chloride 0.9 % bolus 1,000 mL (0 mLs Intravenous Stopping Infusion hung by another clincian 09/14/20 1056)  alum & mag hydroxide-simeth (MAALOX/MYLANTA) 200-200-20 MG/5ML suspension 30 mL (30 mLs Oral Given 09/14/20 1048)    And  lidocaine (XYLOCAINE) 2 % viscous mouth solution 15 mL (15 mLs Oral Given 09/14/20 1048)    ED Course  I have reviewed the triage vital signs and the nursing notes.  Pertinent labs & imaging results that were available during my care of the patient were reviewed by me and considered in my medical decision making (see chart for details).  Clinical Course as of 09/14/20 1140  Sun Sep 14, 2020  1023 Preg Test, Ur: NEGATIVE [CA]  1024 Lipase: 22 [CA]    Clinical Course User Index [CA] Mannie Stabile, PA-C   MDM Rules/Calculators/A&P                         19 year old female presents to the ED due to abdominal pain in the epigastric region for the past few days associate with nausea and vomiting.  Patient had a recent cholecystectomy on 10/8.  Denies frequent alcohol and chronic NSAID use.  No fever or chills.  Upon arrival vitals all within normal limits.  Patient is afebrile, not tachycardic or hypoxic.  Low suspicion for sepsis.  Patient in no acute distress and nontoxic-appearing.  Physical exam significant for abdominal tenderness in the epigastric  region with voluntary guarding.  No tenderness in right lower quadrant.  Dry mucous membranes.  Will obtain routine labs to rule out electrolyte abnormalities and infectious etiology. Lipase to rule out pancreatitis given epigastric tenderness. UA to rule out acute cystitis.  IV fluids, zofran, and GI cocktail given to patient for symptomatic relief here in the ED.  CBC reassuring with no leukocytosis and normal hemoglobin.  Lipase normal at 22.  Doubt pancreatitis.  Negative pregnancy test.  CMP reassuring with normal renal function no major electrolyte derangements.  Significant for hazy appearance and mild ketonuria likely due to dehydration.  Reassessed patient at bedside who notes mild improvement in symptoms after GI cocktail.  Mother at bedside.  Shared decision making in regards to CT abdomen vs. symptomatic treatment.  Patient wishes to receive symptomatic treatment at this time which I find to be reasonable given her abdomen is soft and nondistended on exam.  Low suspicion for acute abdomen.  Patient discharged with Zofran and Bentyl.  Instructed patient to call GI doctor tomorrow for further evaluation of abdominal pain. Strict ED precautions discussed with patient. Patient states understanding and agrees to plan. Patient discharged home in no acute distress and stable vitals  Final Clinical Impression(s) / ED Diagnoses Final diagnoses:  Epigastric pain    Rx / DC Orders ED Discharge Orders         Ordered    ondansetron (ZOFRAN ODT) 4 MG disintegrating tablet  Every 8 hours PRN        09/14/20 1138    dicyclomine (BENTYL) 20 MG tablet  2 times daily        09/14/20 1138           Jesusita Oka 09/14/20 1141    Mancel Bale, MD 09/14/20 [phone]

## 2020-09-15 ENCOUNTER — Telehealth: Payer: Self-pay | Admitting: Gastroenterology

## 2020-09-15 DIAGNOSIS — R109 Unspecified abdominal pain: Secondary | ICD-10-CM

## 2020-09-15 NOTE — Telephone Encounter (Signed)
Patients mother states pt had to go to ED yesterday 12.12.21 for epigastric pain and was has still not seen any improvements from appt on 12.9.21. Pt is still not able to eat and is vomiting green per mom. Pt wanting to know if she needs to come back in or what Dr. Maximino Greenland suggests. Please call pt to discuss.

## 2020-09-16 ENCOUNTER — Other Ambulatory Visit: Payer: Self-pay | Admitting: Gastroenterology

## 2020-09-16 DIAGNOSIS — R1013 Epigastric pain: Secondary | ICD-10-CM

## 2020-09-19 LAB — COMPREHENSIVE METABOLIC PANEL
ALT: 10 IU/L (ref 0–32)
AST: 12 IU/L (ref 0–40)
Albumin/Globulin Ratio: 1.6 (ref 1.2–2.2)
Albumin: 4.5 g/dL (ref 3.9–5.0)
Alkaline Phosphatase: 67 IU/L (ref 42–106)
BUN/Creatinine Ratio: 7 — ABNORMAL LOW (ref 9–23)
BUN: 6 mg/dL (ref 6–20)
Bilirubin Total: 0.4 mg/dL (ref 0.0–1.2)
CO2: 21 mmol/L (ref 20–29)
Calcium: 9.6 mg/dL (ref 8.7–10.2)
Chloride: 104 mmol/L (ref 96–106)
Creatinine, Ser: 0.82 mg/dL (ref 0.57–1.00)
GFR calc Af Amer: 119 mL/min/{1.73_m2} (ref >59)
GFR calc non Af Amer: 103 mL/min/{1.73_m2} (ref >59)
Globulin, Total: 2.8 g/dL (ref 1.5–4.5)
Glucose: 81 mg/dL (ref 65–99)
Potassium: 4.1 mmol/L (ref 3.5–5.2)
Sodium: 139 mmol/L (ref 134–144)
Total Protein: 7.3 g/dL (ref 6.0–8.5)

## 2020-09-19 LAB — CELIAC DISEASE HLA DQ ASSOC.
DQ2 (DQA1 0501/0505,DQB1 02XX): NEGATIVE
DQ8 (DQA1 03XX, DQB1 0302): NEGATIVE

## 2020-09-19 LAB — CBC
Hematocrit: 41.7 % (ref 34.0–46.6)
Hemoglobin: 14.2 g/dL (ref 11.1–15.9)
MCH: 26.7 pg (ref 26.6–33.0)
MCHC: 34.1 g/dL (ref 31.5–35.7)
MCV: 79 fL (ref 79–97)
Platelets: 310 10*3/uL (ref 150–450)
RBC: 5.31 x10E6/uL — ABNORMAL HIGH (ref 3.77–5.28)
RDW: 12.9 % (ref 11.7–15.4)
WBC: 6 10*3/uL (ref 3.4–10.8)

## 2020-09-19 NOTE — Telephone Encounter (Signed)
Called patient but had to leave her a detailed message letting her know that Dr. Maximino Greenland is requesting a stool sample to be collected and that she could go to any [facility] location near her home to pick up the supplies and to let her know that I had also scheduled her a MRI that Dr. Maximino Greenland is wanting her to do. This was scheduled at [facility] Radiology Department on Thursday 10/02/2020 at 4:30 PM and patient is to not eat or drinking anything 4 hours prior. I also let her know that if she had further questions, to please call back.

## 2020-10-02 ENCOUNTER — Other Ambulatory Visit: Payer: Self-pay | Admitting: Gastroenterology

## 2020-10-02 ENCOUNTER — Ambulatory Visit (HOSPITAL_COMMUNITY)
Admission: RE | Admit: 2020-10-02 | Discharge: 2020-10-02 | Disposition: A | Discharge status: Home / Self Care | Payer: Commercial Managed Care - PPO | Source: Ambulatory Visit | Attending: Gastroenterology | Admitting: Gastroenterology

## 2020-10-02 ENCOUNTER — Other Ambulatory Visit: Payer: Self-pay

## 2020-10-02 DIAGNOSIS — R109 Unspecified abdominal pain: Secondary | ICD-10-CM | POA: Insufficient documentation

## 2020-10-02 IMAGING — MR MR ABDOMEN WO/W CM MRCP
21 of 22 series · 45 of 48 positions shown · IV contrast (7 ml Gadavist)
Comparison: Your CP evaluation from [DATE] also with prior
MRCP from [DATE] and prior CT imaging.

CLINICAL DATA: RIGHT upper quadrant pain, pain in chest and back
with history of cholecystectomy.

EXAM:
MRI ABDOMEN WITHOUT AND WITH CONTRAST (INCLUDING MRCP)
TECHNIQUE: Multiplanar multisequence MR imaging of the abdomen was performed
both before and after the administration of intravenous contrast.
Heavily T2-weighted images of the biliary and pancreatic ducts were
obtained, and three-dimensional MRCP images were rendered by post
processing.
CONTRAST:  7mL GADAVIST GADOBUTROL 1 MMOL/ML IV SOLN

[Series 3: cor haste · coronal · 6.0mm · 1.25mm/px · 1 of 26 slices shown]
[im 1/26]
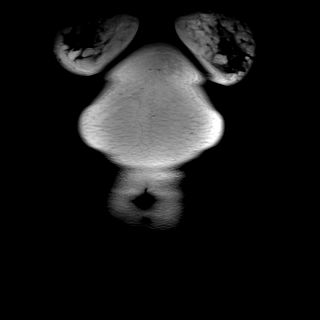

[Series 4: ax haste · axial · 6.0mm · 1.19mm/px · 1 of 30 slices shown]
[im 1/30]
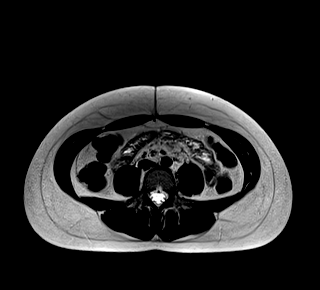

[Series 6: T2 fat-sat · axial · 6.0mm · 1.19mm/px · 1 of 5 slices shown (1 of 2)]
[im 1/5]
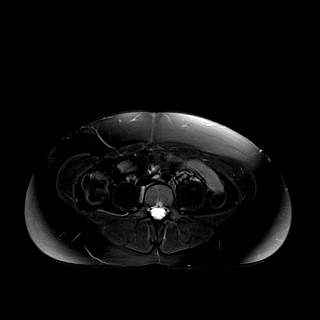

[Series 11: T2 fat-sat · axial · 6.0mm · 1.19mm/px · 1 of 30 slices shown (2 of 2)]
[im 1/30]
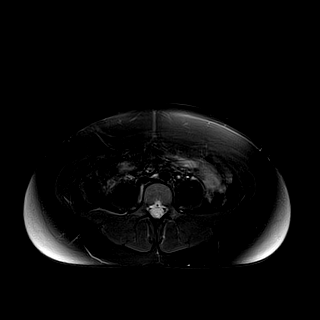

[Series 12: DWI · axial · 6.0mm · 1.42mm/px · 1 of 30 slices shown (1 of 4)]
[im 1/30]
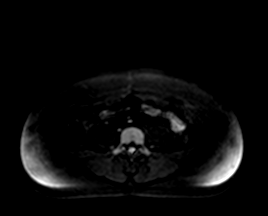

[Series 12: DWI · axial · 6.0mm · 1.42mm/px · 1 of 30 slices shown (2 of 4)]
[im 1/30]
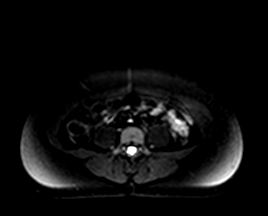

[Series 12: DWI · axial · 6.0mm · 1.42mm/px · 1 of 30 slices shown (3 of 4)]
[im 1/30]
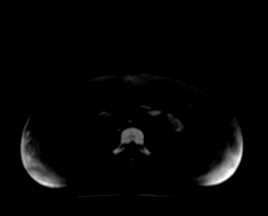

[Series 13: DWI · axial · 6.0mm · 1.42mm/px · 1 of 30 slices shown (4 of 4)]
[im 1/30]
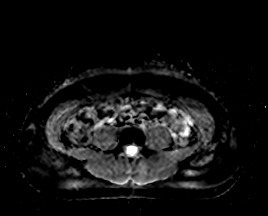

[Series 14: ax in and · axial · 3.0mm · 1.19mm/px · z∈[-114,+99]mm · 3 of 72 slices shown (1 of 2)]
[im 1/72]
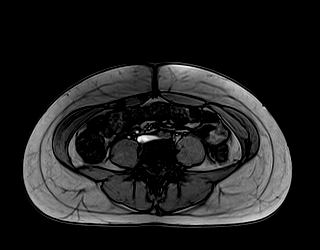
[im 36/72]
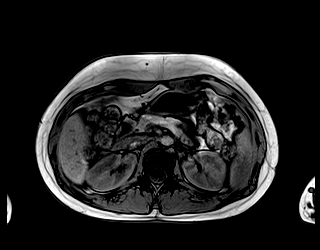
[im 72/72]
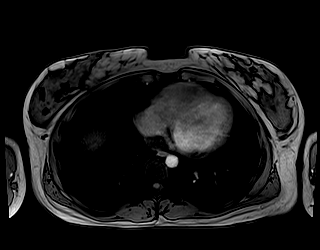

[Series 15: ax in and · axial · 3.0mm · 1.19mm/px · z∈[-114,+99]mm · 3 of 72 slices shown (2 of 2)]
[im 1/72]
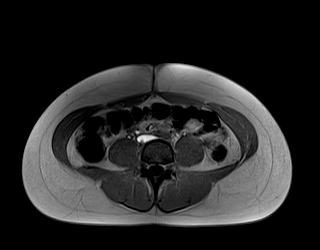
[im 36/72]
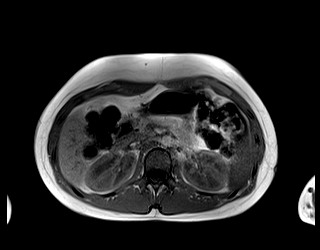
[im 72/72]
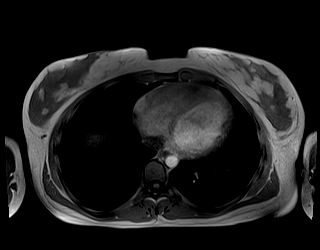

[Series 16: MRCP · coronal · 4.0mm · 1.12mm/px · 1 of 15 slices shown]
[im 1/15]
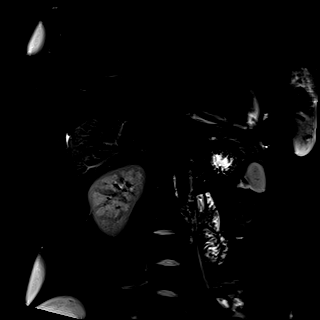

[Series 17: T1 dynamic · axial · non-contrast · 3.0mm · 1.19mm/px · z∈[-114,+99]mm · 3 of 72 slices shown (1 of 4)]
[im 1/72]
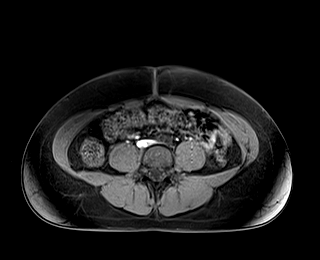
[im 36/72]
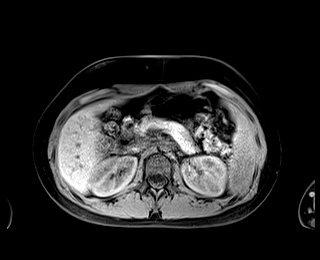
[im 72/72]
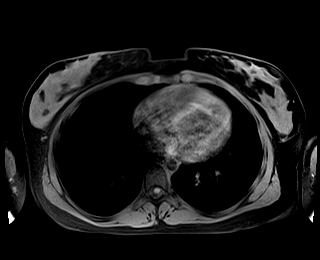

[Series 19: T1 dynamic post-contrast · axial · 3.0mm · 1.19mm/px · z∈[-114,+99]mm · 3 of 72 slices shown (1 of 6)]
[im 1/72]
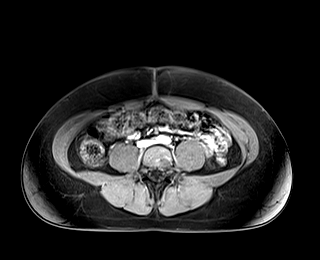
[im 36/72]
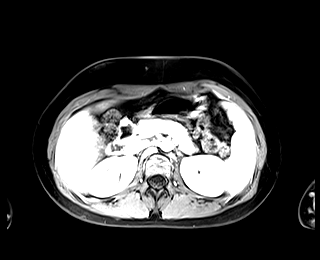
[im 72/72]
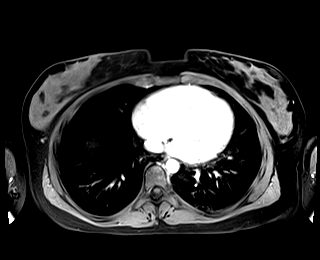

[Series 20: T1 dynamic · axial · 3.0mm · 1.19mm/px · z∈[-114,+99]mm · 3 of 72 slices shown (2 of 4)]
[im 1/72]
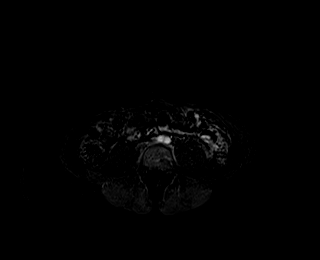
[im 36/72]
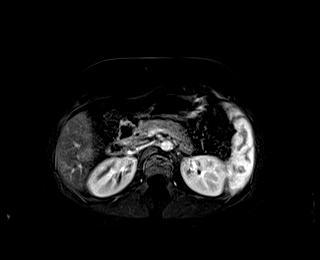
[im 72/72]
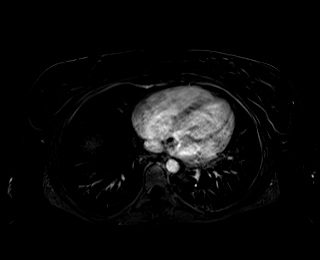

[Series 21: T1 dynamic post-contrast · axial · 3.0mm · 1.19mm/px · z∈[-114,+99]mm · 3 of 72 slices shown (2 of 6)]
[im 1/72]
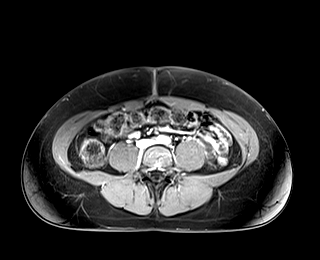
[im 36/72]
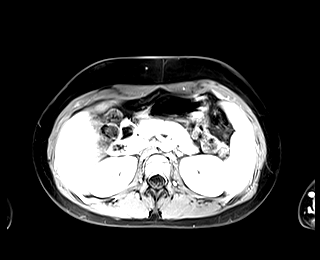
[im 72/72]
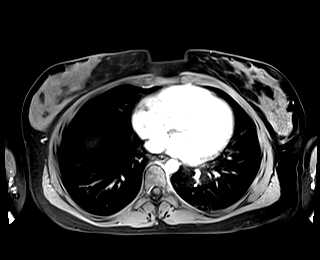

[Series 22: T1 dynamic · axial · 3.0mm · 1.19mm/px · z∈[-114,+99]mm · 3 of 72 slices shown (3 of 4)]
[im 1/72]
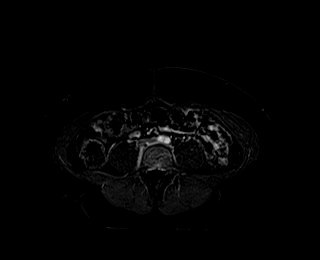
[im 36/72]
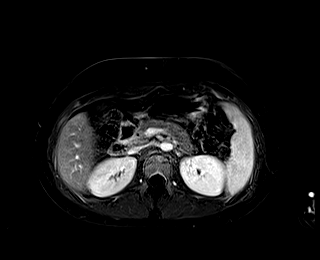
[im 72/72]
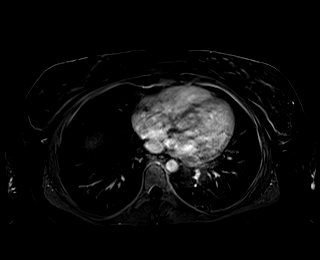

[Series 23: T1 dynamic post-contrast · axial · 3.0mm · 1.19mm/px · z∈[-114,+99]mm · 3 of 72 slices shown (3 of 6)]
[im 1/72]
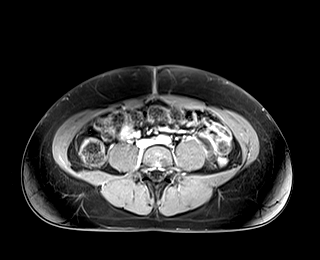
[im 36/72]
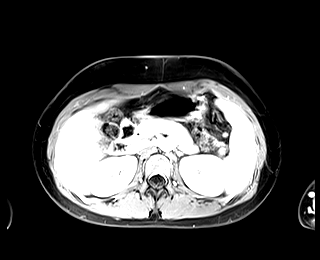
[im 72/72]
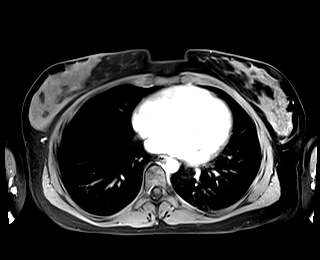

[Series 24: T1 dynamic · axial · 3.0mm · 1.19mm/px · z∈[-114,+99]mm · 3 of 72 slices shown (4 of 4)]
[im 1/72]
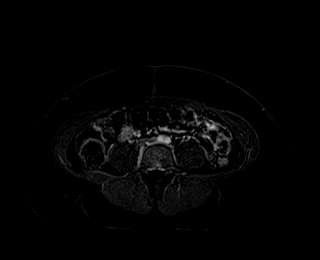
[im 36/72]
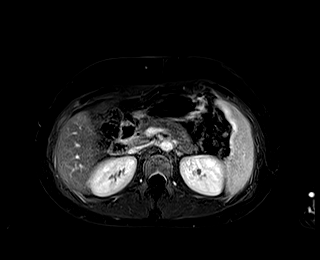
[im 72/72]
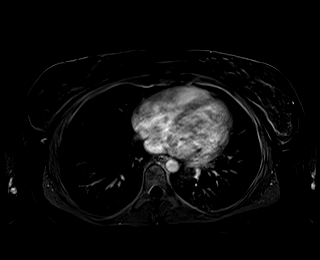

[Series 25: T1 dynamic post-contrast · coronal · 3.0mm · 1.31mm/px · 3 of 64 slices shown (4 of 6)]
[im 1/64]
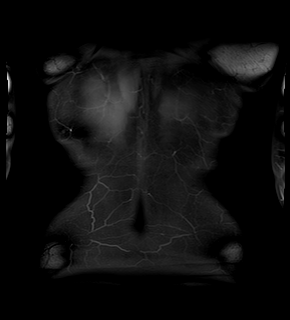
[im 32/64]
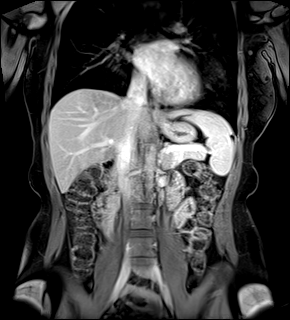
[im 64/64]
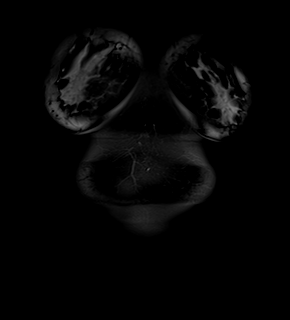

[Series 26: T1 dynamic post-contrast · axial · 3.0mm · 1.19mm/px · z∈[-114,+99]mm · 3 of 72 slices shown (5 of 6)]
[im 1/72]
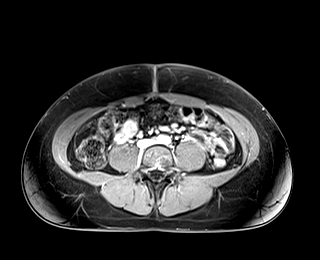
[im 36/72]
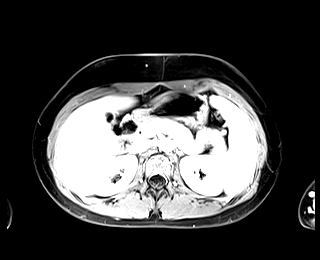
[im 72/72]
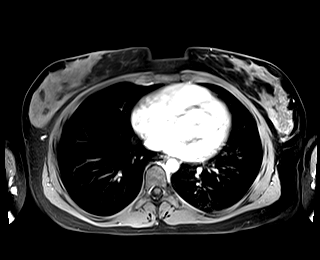

[Series 27: T1 dynamic post-contrast · axial · 3.0mm · 1.19mm/px · z∈[-114,+99]mm · 3 of 72 slices shown (6 of 6)]
[im 1/72]
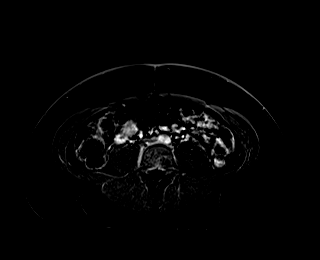
[im 36/72]
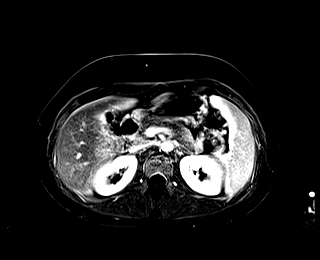
[im 72/72]
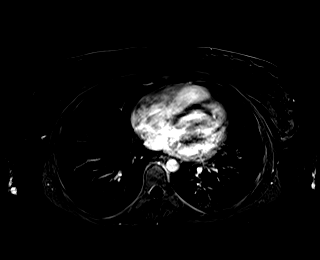

[45 of 48 positions shown; findings below may reference images not displayed]

FINDINGS: Lower chest: Incidental imaging of the lung bases is unremarkable on
MRI. Limited assessment.

Hepatobiliary: Normal appearance of the liver, no focal suspicious
hepatic abnormality. The portal vein is widely patent. Hepatic veins
are patent. Post cholecystectomy. No biliary duct dilation. No
pancreatic ductal dilation.

High-resolution MRCP images are limited due to motion but there is
no visible filling defect on additional sequences and the common
bile duct caliber is approximately 2-3 mm without irregularity.

Pancreas: Normal intrinsic T1 signal in the pancreas. No pancreatic
ductal dilation or focal pancreatic lesion.

Spleen:  Normal

Adrenals/Urinary Tract: Adrenal glands are normal. No signs of
hydronephrosis. Symmetric renal enhancement. No suspicious renal
lesion.

Stomach/Bowel: Limited assessment of the gastrointestinal tract on
MRI not performed for bowel evaluation. No acute gastrointestinal
process.

Vascular/Lymphatic: Vascular structures in the abdomen are patent
with normal caliber. There is no gastrohepatic or hepatoduodenal
ligament lymphadenopathy. No retroperitoneal or mesenteric
lymphadenopathy.

Other:  No ascites.

Musculoskeletal: No suspicious bone lesions identified.
IMPRESSION: 1. Post cholecystectomy. No biliary duct dilation or
choledocholithiasis.
2. Otherwise unremarkable MRI of the abdomen.

## 2020-10-02 MED ORDER — GADOBUTROL 1 MMOL/ML IV SOLN
7.0000 mL | Freq: Once | INTRAVENOUS | Status: AC | PRN
  Administered 2020-10-02: 7 mL via INTRAVENOUS

## 2020-10-21 ENCOUNTER — Telehealth (INDEPENDENT_AMBULATORY_CARE_PROVIDER_SITE_OTHER): Payer: Commercial Managed Care - PPO | Admitting: Gastroenterology

## 2020-10-21 ENCOUNTER — Encounter: Payer: Self-pay | Admitting: Gastroenterology

## 2020-10-21 ENCOUNTER — Other Ambulatory Visit: Payer: Self-pay

## 2020-10-21 DIAGNOSIS — Z5329 Procedure and treatment not carried out because of patient's decision for other reasons: Secondary | ICD-10-CM

## 2020-10-21 DIAGNOSIS — K2100 Gastro-esophageal reflux disease with esophagitis, without bleeding: Secondary | ICD-10-CM | POA: Insufficient documentation

## 2020-10-21 DIAGNOSIS — G43109 Migraine with aura, not intractable, without status migrainosus: Secondary | ICD-10-CM | POA: Insufficient documentation

## 2020-10-22 ENCOUNTER — Encounter: Payer: Self-pay | Admitting: Gastroenterology

## 2020-10-22 ENCOUNTER — Telehealth (INDEPENDENT_AMBULATORY_CARE_PROVIDER_SITE_OTHER): Payer: Commercial Managed Care - PPO | Admitting: Gastroenterology

## 2020-10-22 DIAGNOSIS — R1084 Generalized abdominal pain: Secondary | ICD-10-CM

## 2020-10-22 DIAGNOSIS — K229 Disease of esophagus, unspecified: Secondary | ICD-10-CM | POA: Diagnosis not present

## 2020-10-23 NOTE — Progress Notes (Signed)
[name], MD [address]  [address]  [place], [place] [address]  Main: [phone]  Fax: [phone]   Primary Care Physician: [name], MD  Virtual Visit via Telephone Note  I connected with patient on 10/23/20 at  2:45 PM EST by telephone and verified that I am speaking with the correct person using two identifiers.   I discussed the limitations, risks, security and privacy concerns of performing an evaluation and management service by telephone and the availability of in person appointments. I also discussed with the patient that there may be a patient responsible charge related to this service. The patient expressed understanding and agreed to proceed.  Location of Patient: Home Location of Provider: Home Persons involved: Patient and provider only during the visit (nursing staff and front desk staff was involved in communicating with the patient prior to the appointment, reviewing medications and checking them in)   History of Present Illness: Chief Complaint  Patient presents with  . Abdominal Pain     HPI: Annette Gould is a 20 y.o. female here for follow-up of abdominal pain.  Patient states her symptoms have completely resolved at this time.  She took Bentyl as needed for about 2 weeks and since then her pain has completely resolved.  She is not taking it anymore.  She completed her Protonix previously as well and is not on it anymore.  She is reporting 2 soft bowel movements a day since her gallbladder removal and states she does not have any problems with her bowel habits.  No blood in stool.  Current Outpatient Medications  Medication Sig Dispense Refill  . AUROVELA FE 1.5/30 1.5-30 MG-MCG tablet Take 1 tablet by mouth daily.    Marland Kitchen dicyclomine (BENTYL) 20 MG tablet Take 1 tablet (20 mg total) by mouth 2 (two) times daily. 20 tablet 0  . propranolol (INDERAL) 10 MG tablet Take 10 mg by mouth daily as needed (If blood pressure rises).     .  sertraline (ZOLOFT) 50 MG tablet Take 75 mg by mouth daily.    . pantoprazole (PROTONIX) 40 MG tablet Take 1 tablet (40 mg total) by mouth 2 (two) times daily. 60 tablet 0   No current facility-administered medications for this visit.    Allergies as of 10/22/2020 - Review Complete 10/22/2020  Allergen Reaction Noted  . Peanut-containing drug products Hives and Swelling 04/10/2020    Review of Systems:    All systems reviewed and negative except where noted in HPI.   Observations/Objective:  Labs: CMP     Component Value Date/Time   NA 135 09/14/2020 0950   NA 139 09/11/2020 1532   K 3.6 09/14/2020 0950   CL 104 09/14/2020 0950   CO2 23 09/14/2020 0950   GLUCOSE 100 (H) 09/14/2020 0950   BUN 7 09/14/2020 0950   BUN 6 09/11/2020 1532   CREATININE 0.64 09/14/2020 0950   CALCIUM 9.5 09/14/2020 0950   PROT 8.0 09/14/2020 0950   PROT 7.3 09/11/2020 1532   ALBUMIN 4.6 09/14/2020 0950   ALBUMIN 4.5 09/11/2020 1532   AST 15 09/14/2020 0950   ALT 11 09/14/2020 0950   ALKPHOS 55 09/14/2020 0950   BILITOT 0.3 09/14/2020 0950   BILITOT 0.4 09/11/2020 1532   GFRNONAA >60 09/14/2020 0950   GFRAA 119 09/11/2020 1532   Lab Results  Component Value Date   WBC 6.8 09/14/2020   HGB 14.4 09/14/2020   HCT 41.8 09/14/2020   MCV 78.3 (L)  09/14/2020   PLT 297 09/14/2020    Imaging Studies: MR 3D Recon At Scanner  Result Date: 10/03/2020 CLINICAL DATA:  RIGHT upper quadrant pain, pain in chest and back with history of cholecystectomy. EXAM: MRI ABDOMEN WITHOUT AND WITH CONTRAST (INCLUDING MRCP) TECHNIQUE: Multiplanar multisequence MR imaging of the abdomen was performed both before and after the administration of intravenous contrast. Heavily T2-weighted images of the biliary and pancreatic ducts were obtained, and three-dimensional MRCP images were rendered by post processing. CONTRAST:  51mL GADAVIST GADOBUTROL 1 MMOL/ML IV SOLN COMPARISON:  Your CP evaluation from October of 2021 also  with prior MRCP from July 08, 2020 and prior CT imaging. FINDINGS: Lower chest: Incidental imaging of the lung bases is unremarkable on MRI. Limited assessment. Hepatobiliary: Normal appearance of the liver, no focal suspicious hepatic abnormality. The portal vein is widely patent. Hepatic veins are patent. Post cholecystectomy. No biliary duct dilation. No pancreatic ductal dilation. High-resolution MRCP images are limited due to motion but there is no visible filling defect on additional sequences and the common bile duct caliber is approximately 2-3 mm without irregularity. Pancreas: Normal intrinsic T1 signal in the pancreas. No pancreatic ductal dilation or focal pancreatic lesion. Spleen:  Normal Adrenals/Urinary Tract: Adrenal glands are normal. No signs of hydronephrosis. Symmetric renal enhancement. No suspicious renal lesion. Stomach/Bowel: Limited assessment of the gastrointestinal tract on MRI not performed for bowel evaluation. No acute gastrointestinal process. Vascular/Lymphatic: Vascular structures in the abdomen are patent with normal caliber. There is no gastrohepatic or hepatoduodenal ligament lymphadenopathy. No retroperitoneal or mesenteric lymphadenopathy. Other:  No ascites. Musculoskeletal: No suspicious bone lesions identified. IMPRESSION: 1. Post cholecystectomy. No biliary duct dilation or choledocholithiasis. 2. Otherwise unremarkable MRI of the abdomen. Electronically Signed   By: Donzetta Kohut M.D.   On: 10/03/2020 09:23   MR ABDOMEN MRCP W WO CONTAST  Result Date: 10/03/2020 CLINICAL DATA:  RIGHT upper quadrant pain, pain in chest and back with history of cholecystectomy. EXAM: MRI ABDOMEN WITHOUT AND WITH CONTRAST (INCLUDING MRCP) TECHNIQUE: Multiplanar multisequence MR imaging of the abdomen was performed both before and after the administration of intravenous contrast. Heavily T2-weighted images of the biliary and pancreatic ducts were obtained, and three-dimensional MRCP  images were rendered by post processing. CONTRAST:  31mL GADAVIST GADOBUTROL 1 MMOL/ML IV SOLN COMPARISON:  Your CP evaluation from October of 2021 also with prior MRCP from July 08, 2020 and prior CT imaging. FINDINGS: Lower chest: Incidental imaging of the lung bases is unremarkable on MRI. Limited assessment. Hepatobiliary: Normal appearance of the liver, no focal suspicious hepatic abnormality. The portal vein is widely patent. Hepatic veins are patent. Post cholecystectomy. No biliary duct dilation. No pancreatic ductal dilation. High-resolution MRCP images are limited due to motion but there is no visible filling defect on additional sequences and the common bile duct caliber is approximately 2-3 mm without irregularity. Pancreas: Normal intrinsic T1 signal in the pancreas. No pancreatic ductal dilation or focal pancreatic lesion. Spleen:  Normal Adrenals/Urinary Tract: Adrenal glands are normal. No signs of hydronephrosis. Symmetric renal enhancement. No suspicious renal lesion. Stomach/Bowel: Limited assessment of the gastrointestinal tract on MRI not performed for bowel evaluation. No acute gastrointestinal process. Vascular/Lymphatic: Vascular structures in the abdomen are patent with normal caliber. There is no gastrohepatic or hepatoduodenal ligament lymphadenopathy. No retroperitoneal or mesenteric lymphadenopathy. Other:  No ascites. Musculoskeletal: No suspicious bone lesions identified. IMPRESSION: 1. Post cholecystectomy. No biliary duct dilation or choledocholithiasis. 2. Otherwise unremarkable MRI of the abdomen. Electronically Signed  By: Donzetta Kohut M.D.   On: 10/03/2020 09:23    Assessment and Plan:   Annette Gould is a 20 y.o. y/o female here for follow-up of abdominal pain  Assessment and Plan: Symptoms have completely resolved at this time  No indication for further work-up  I discussed with her that if her bowel movements become looser or more frequent given her  cholecystectomy in October, to give [place] a call and we may start Colestid.  She is currently happy with her bowel movement pattern, 2 soft bowel movements a day and does not think she needs any medications, this is reasonable.  She was previously scheduled for EUS with Dr. Joyce Gross this was canceled due to her admission for gallstone pancreatitis in October.  We will reach out to them again to reschedule this  Follow Up Instructions:    I discussed the assessment and treatment plan with the patient. The patient was provided an opportunity to ask questions and all were answered. The patient agreed with the plan and demonstrated an understanding of the instructions.   The patient was advised to call back or seek an in-person evaluation if the symptoms worsen or if the condition fails to improve as anticipated.  I provided 12 minutes of non-face-to-face time during this encounter. Additional time was spent in reviewing patient's chart, placing orders etc.   Pasty Spillers, MD  Speech recognition software was used to dictate this note.

## 2020-10-23 NOTE — Progress Notes (Signed)
rescheduled

## 2020-10-24 ENCOUNTER — Ambulatory Visit
Admission: EM | Admit: 2020-10-24 | Discharge: 2020-10-24 | Disposition: A | Discharge status: Home / Self Care | Payer: Commercial Managed Care - PPO | Attending: Family Medicine | Admitting: Family Medicine

## 2020-10-24 ENCOUNTER — Other Ambulatory Visit: Payer: Self-pay

## 2020-10-24 ENCOUNTER — Encounter: Payer: Self-pay | Admitting: Emergency Medicine

## 2020-10-24 DIAGNOSIS — R109 Unspecified abdominal pain: Secondary | ICD-10-CM

## 2020-10-24 DIAGNOSIS — E860 Dehydration: Secondary | ICD-10-CM | POA: Diagnosis not present

## 2020-10-24 DIAGNOSIS — Z8719 Personal history of other diseases of the digestive system: Secondary | ICD-10-CM

## 2020-10-24 DIAGNOSIS — R1114 Bilious vomiting: Secondary | ICD-10-CM | POA: Diagnosis not present

## 2020-10-24 DIAGNOSIS — R509 Fever, unspecified: Secondary | ICD-10-CM

## 2020-10-24 LAB — POCT URINALYSIS DIP (MANUAL ENTRY)
Bilirubin, UA: NEGATIVE
Glucose, UA: NEGATIVE mg/dL
Leukocytes, UA: NEGATIVE
Nitrite, UA: NEGATIVE
Protein Ur, POC: NEGATIVE mg/dL
Spec Grav, UA: 1.015 (ref 1.010–1.025)
Urobilinogen, UA: 0.2 E.U./dL
pH, UA: 7.5 (ref 5.0–8.0)

## 2020-10-24 LAB — POCT URINE PREGNANCY: Preg Test, Ur: NEGATIVE

## 2020-10-24 MED ORDER — ONDANSETRON 4 MG PO TBDP
4.0000 mg | ORAL_TABLET | Freq: Once | ORAL | Status: AC
  Administered 2020-10-24: 4 mg via ORAL

## 2020-10-24 MED ORDER — KETOROLAC TROMETHAMINE 30 MG/ML IJ SOLN
30.0000 mg | Freq: Once | INTRAMUSCULAR | Status: AC
  Administered 2020-10-24: 30 mg via INTRAMUSCULAR

## 2020-10-24 MED ORDER — PROMETHAZINE HCL 25 MG PO TABS: 25.0000 mg | ORAL_TABLET | Freq: Four times a day (QID) | ORAL | 0 refills | Status: DC | PRN

## 2020-10-24 MED ORDER — SODIUM CHLORIDE 0.9 % IV BOLUS
1000.0000 mL | Freq: Once | INTRAVENOUS | Status: AC
  Administered 2020-10-24: 1000 mL via INTRAVENOUS

## 2020-10-24 NOTE — ED Triage Notes (Signed)
Lower abd ,lower back pain, nausea and vomiting since 10pm last night.

## 2020-10-24 NOTE — ED Provider Notes (Signed)
[facility]   [phone] 10/24/20 Arrival Time: 6384   CC: Nausea and Vomiting  SUBJECTIVE: History from: patient.  Annette Gould is a 20 y.o. female who presents with nausea, vomiting, lower abdominal pain, lower back pain since last night. Denies sick exposure to COVID, flu or strep. Denies recent travel. Has history of pancreatitis and cholecystectomy in October 2021. Has negative history of Covid. Has not completed Covid vaccines. Has not taken OTC medications for this. Denies previous symptoms in the past. Denies fever, chills, fatigue, sinus pain, rhinorrhea, sore throat, SOB, wheezing, chest pain, changes in bladder habits.    ROS: As per HPI.  All other pertinent ROS negative.     Past Medical History:  Diagnosis Date  . Bursitis    hips  . GERD (gastroesophageal reflux disease)   . Migraine headache    approx 2/month   Past Surgical History:  Procedure Laterality Date  . BIOPSY N/A 04/15/2020   Procedure: BIOPSY;  Surgeon: Pasty Spillers, MD;  Location: [facility];  Service: Endoscopy;  Laterality: N/A;  . CHOLECYSTECTOMY N/A 07/10/2020   Procedure: LAPAROSCOPIC CHOLECYSTECTOMY WITH INTRAOPERATIVE CHOLANGIOGRAM;  Surgeon: [name], MD;  Location: [facility];  Service: General;  Laterality: N/A;  . DENTAL SURGERY    . ERCP N/A 07/11/2020   Procedure: ENDOSCOPIC RETROGRADE CHOLANGIOPANCREATOGRAPHY (ERCP);  Surgeon: Malissa Hippo, MD;  Location: [facility];  Service: Endoscopy;  Laterality: N/A;  . ESOPHAGOGASTRODUODENOSCOPY (EGD) WITH PROPOFOL N/A 04/15/2020   Procedure: ESOPHAGOGASTRODUODENOSCOPY (EGD) WITH PROPOFOL;  Surgeon: Pasty Spillers, MD;  Location: [facility];  Service: Endoscopy;  Laterality: N/A;  . SPHINCTEROTOMY N/A 07/11/2020   Procedure: SPHINCTEROTOMY PYLORICE CHANNEL DILALATION , NORMAL SIZE BILE DUCT;  Surgeon: Malissa Hippo, MD;  Location: [facility];  Service: Endoscopy;  Laterality: N/A;  . STONE  EXTRACTION WITH BASKET N/A 07/11/2020   Procedure: STONE EXTRACTION WITH WITH BALLOON;  Surgeon: Malissa Hippo, MD;  Location: [facility];  Service: Endoscopy;  Laterality: N/A;  . TYMPANOSTOMY TUBE PLACEMENT     Allergies  Allergen Reactions  . Peanut-Containing Drug Products Hives and Swelling    Peanuts   No current facility-administered medications on file prior to encounter.   Current Outpatient Medications on File Prior to Encounter  Medication Sig Dispense Refill  . AUROVELA FE 1.5/30 1.5-30 MG-MCG tablet Take 1 tablet by mouth daily.    Marland Kitchen dicyclomine (BENTYL) 20 MG tablet Take 1 tablet (20 mg total) by mouth 2 (two) times daily. 20 tablet 0  . pantoprazole (PROTONIX) 40 MG tablet Take 1 tablet (40 mg total) by mouth 2 (two) times daily. 60 tablet 0  . propranolol (INDERAL) 10 MG tablet Take 10 mg by mouth daily as needed (If blood pressure rises).     . sertraline (ZOLOFT) 50 MG tablet Take 75 mg by mouth daily.    . [DISCONTINUED] famotidine (PEPCID) 20 MG tablet Take 1 tablet (20 mg total) by mouth 2 (two) times daily. 30 tablet 0   Social History   Socioeconomic History  . Marital status: Single    Spouse name: Not on file  . Number of children: Not on file  . Years of education: Not on file  . Highest education level: Not on file  Occupational History  . Not on file  Tobacco Use  . Smoking status: Former Smoker    Quit date: 12/03/2019    Years since quitting: 0.8  . Smokeless tobacco: Never Used  .  Tobacco comment: Smoked less than 1 pack per week for about 3 months  Vaping Use  . Vaping Use: Never used  Substance and Sexual Activity  . Alcohol use: Not Currently    Comment: may have 1-2 drinks/month. None currently.   . Drug use: Yes    Types: Marijuana  . Sexual activity: Yes  Other Topics Concern  . Not on file  Social History Narrative  . Not on file   Social Determinants of Health   Financial Resource Strain: Not on file  Food Insecurity: Not  on file  Transportation Needs: Not on file  Physical Activity: Not on file  Stress: Not on file  Social Connections: Not on file  Intimate Partner Violence: Not on file   Family History  Problem Relation Age of Onset  . Gastric cancer Maternal Grandfather        "not sure where, but somewhere in the stomach area"  . Colon cancer Neg Hx     OBJECTIVE:  Vitals:   10/24/20 0921 10/24/20 1118  BP: (!) 141/82   Pulse: (!) 103 82  Resp: 18   Temp: (!) 97.5 F (36.4 C)   TempSrc: Oral   SpO2: 99% 99%     General appearance: alert; appears fatigued, but nontoxic; speaking in full sentences and tolerating own secretions HEENT: NCAT; Ears: EACs clear, TMs pearly gray; Eyes: PERRL.  EOM grossly intact. Sinuses: nontender; Nose: nares patent with clear rhinorrhea, Throat: oropharynx erythematous, cobblestoning present, tonsils non erythematous or enlarged, uvula midline Abd: LUQ tenderness, soft, nondistended, negative Murphy's sign, non tender at McBurney's point Neck: supple without LAD Lungs: unlabored respirations, symmetrical air entry; cough: absent; no respiratory distress; CTAB Heart: regular rate and rhythm.  Radial pulses 2+ symmetrical bilaterally Skin: warm and dry Psychological: alert and cooperative; normal mood and affect  LABS:  Results for orders placed or performed during the hospital encounter of 10/24/20 (from the past 24 hour(s))  CBC with Differential/Platelet     Status: Abnormal   Collection Time: 10/24/20 10:09 AM  Result Value Ref Range   WBC 12.0 (H) 3.4 - 10.8 x10E3/uL   RBC 5.37 (H) 3.77 - 5.28 x10E6/uL   Hemoglobin 14.4 11.1 - 15.9 g/dL   Hematocrit 96.041.6 45.434.0 - 46.6 %   MCV 78 (L) 79 - 97 fL   MCH 26.8 26.6 - 33.0 pg   MCHC 34.6 31.5 - 35.7 g/dL   RDW 09.812.4 11.911.7 - 14.715.4 %   Platelets 292 150 - 450 x10E3/uL   Neutrophils 86 Not Estab. %   Lymphs 7 Not Estab. %   Monocytes 7 Not Estab. %   Eos 0 Not Estab. %   Basos 0 Not Estab. %   Neutrophils  Absolute 10.2 (H) 1.4 - 7.0 x10E3/uL   Lymphocytes Absolute 0.9 0.7 - 3.1 x10E3/uL   Monocytes Absolute 0.9 0.1 - 0.9 x10E3/uL   EOS (ABSOLUTE) 0.0 0.0 - 0.4 x10E3/uL   Basophils Absolute 0.1 0.0 - 0.2 x10E3/uL   Immature Granulocytes 0 Not Estab. %   Immature Grans (Abs) 0.0 0.0 - 0.1 x10E3/uL   Narrative   Performed at:  [address] - [facility] [address], [place], [place]  [id number] Lab Director: Jolene SchimkeSanjai Nagendra MD, Phone:  [phone]  Comprehensive metabolic panel     Status: Abnormal   Collection Time: 10/24/20 10:09 AM  Result Value Ref Range   Glucose 105 (H) 65 - 99 mg/dL   BUN 14 6 - 20 mg/dL   Creatinine,  Ser 1.27 (H) 0.57 - 1.00 mg/dL   GFR calc non Af Amer 61 >59 mL/min/1.73   GFR calc Af Amer 70 >59 mL/min/1.73   BUN/Creatinine Ratio 11 9 - 23   Sodium 143 134 - 144 mmol/L   Potassium 4.1 3.5 - 5.2 mmol/L   Chloride 107 (H) 96 - 106 mmol/L   CO2 20 20 - 29 mmol/L   Calcium 10.1 8.7 - 10.2 mg/dL   Total Protein 7.4 6.0 - 8.5 g/dL   Albumin 4.6 3.9 - 5.0 g/dL   Globulin, Total 2.8 1.5 - 4.5 g/dL   Albumin/Globulin Ratio 1.6 1.2 - 2.2   Bilirubin Total 0.3 0.0 - 1.2 mg/dL   Alkaline Phosphatase 79 42 - 106 IU/L   AST 18 0 - 40 IU/L   ALT 14 0 - 32 IU/L   Narrative   Performed at:  [address], [place], [place]  [id number] Lab Director: Jolene Schimke MD, Phone:  [phone]  Lipase     Status: None   Collection Time: 10/24/20 10:09 AM  Result Value Ref Range   Lipase 17 14 - 72 U/L   Narrative   Performed at:  [address] [facility] [address], [place], [place]  [id number] Lab Director: Jolene Schimke MD, Phone:  [phone]     ASSESSMENT & PLAN:  1. Abdominal pain, unspecified abdominal location   2. Fever, unspecified   3. Dehydration   4. Bilious vomiting with nausea   5. History of pancreatitis     Meds ordered this encounter  Medications  . ondansetron (ZOFRAN-ODT) disintegrating tablet 4 mg  . ketorolac  (TORADOL) 30 MG/ML injection 30 mg  . sodium chloride 0.9 % bolus 1,000 mL  . promethazine (PHENERGAN) 25 MG tablet    Sig: Take 1 tablet (25 mg total) by mouth every 6 (six) hours as needed for nausea or vomiting.    Dispense:  30 tablet    Refill:  0    Order Specific Question:   Supervising Provider    Answer:   Merrilee Jansky [[id number]]    UA unremarkable in office today Pregnancy test negative in office today CBC, CMP, lipase drawn and will follow up with abnormal labs as needed Zofran given in office today Toradol 30mg  IM in office today NS bolus given in office Prescribed phenergan for home for nausea Is established with GI, states that they were closed today Follow up with GI Monday Discussed with patient that she would be best served in the ER for further evaluation and treatment She declines at this time Go to the ER for further evaluation and treatment if symptoms persist or worsen  COVID and flu testing ordered.  It will take between 2-3 days for test results. Someone will contact you regarding abnormal results. Work note provided Patient should remain in quarantine until they have received Covid results.  If negative you may resume normal activities (go back to work/school) while practicing hand hygiene, social distance, and mask wearing.  If positive, patient should remain in quarantine for at least 5 days from symptom onset AND greater than 72 hours after symptoms resolution (absence of fever without the use of fever-reducing medication and improvement in respiratory symptoms), whichever is longer Get plenty of rest and push fluids  Call or go to the ED if you have any new or worsening symptoms such as fever, worsening cough, shortness of breath, chest tightness, chest pain, turning blue, changes in mental status.  Reviewed expectations re: course of current medical issues. Questions answered. Outlined signs and symptoms indicating need for more acute  intervention. Patient verbalized understanding. After Visit Summary given.         Moshe Cipro, NP 10/25/20 [phone]

## 2020-10-24 NOTE — Discharge Instructions (Addendum)
We have given you fluids, zofran for nausea and toradol for pain in the office today.  I have prescribed phenergan for you to use at home as needed for nausea  Bloodwork is pending. Will be in touch with abnormal results that require treatment  Follow up with GI  Follow up in the ER for acute worsening symptoms.

## 2020-10-25 LAB — CBC WITH DIFFERENTIAL/PLATELET
Basophils Absolute: 0.1 10*3/uL (ref 0.0–0.2)
Basos: 0 %
EOS (ABSOLUTE): 0 10*3/uL (ref 0.0–0.4)
Eos: 0 %
Hematocrit: 41.6 % (ref 34.0–46.6)
Hemoglobin: 14.4 g/dL (ref 11.1–15.9)
Immature Grans (Abs): 0 10*3/uL (ref 0.0–0.1)
Immature Granulocytes: 0 %
Lymphocytes Absolute: 0.9 10*3/uL (ref 0.7–3.1)
Lymphs: 7 %
MCH: 26.8 pg (ref 26.6–33.0)
MCHC: 34.6 g/dL (ref 31.5–35.7)
MCV: 78 fL — ABNORMAL LOW (ref 79–97)
Monocytes Absolute: 0.9 10*3/uL (ref 0.1–0.9)
Monocytes: 7 %
Neutrophils Absolute: 10.2 10*3/uL — ABNORMAL HIGH (ref 1.4–7.0)
Neutrophils: 86 %
Platelets: 292 10*3/uL (ref 150–450)
RBC: 5.37 x10E6/uL — ABNORMAL HIGH (ref 3.77–5.28)
RDW: 12.4 % (ref 11.7–15.4)
WBC: 12 10*3/uL — ABNORMAL HIGH (ref 3.4–10.8)

## 2020-10-25 LAB — COMPREHENSIVE METABOLIC PANEL
ALT: 14 IU/L (ref 0–32)
AST: 18 IU/L (ref 0–40)
Albumin/Globulin Ratio: 1.6 (ref 1.2–2.2)
Albumin: 4.6 g/dL (ref 3.9–5.0)
Alkaline Phosphatase: 79 IU/L (ref 42–106)
BUN/Creatinine Ratio: 11 (ref 9–23)
BUN: 14 mg/dL (ref 6–20)
Bilirubin Total: 0.3 mg/dL (ref 0.0–1.2)
CO2: 20 mmol/L (ref 20–29)
Calcium: 10.1 mg/dL (ref 8.7–10.2)
Chloride: 107 mmol/L — ABNORMAL HIGH (ref 96–106)
Creatinine, Ser: 1.27 mg/dL — ABNORMAL HIGH (ref 0.57–1.00)
GFR calc Af Amer: 70 mL/min/{1.73_m2} (ref >59)
GFR calc non Af Amer: 61 mL/min/{1.73_m2} (ref >59)
Globulin, Total: 2.8 g/dL (ref 1.5–4.5)
Glucose: 105 mg/dL — ABNORMAL HIGH (ref 65–99)
Potassium: 4.1 mmol/L (ref 3.5–5.2)
Sodium: 143 mmol/L (ref 134–144)
Total Protein: 7.4 g/dL (ref 6.0–8.5)

## 2020-10-25 LAB — LIPASE: Lipase: 17 U/L (ref 14–72)

## 2020-10-26 LAB — COVID-19, FLU A+B NAA
Influenza A, NAA: NOT DETECTED
Influenza B, NAA: NOT DETECTED
SARS-CoV-2, NAA: NOT DETECTED

## 2020-11-05 ENCOUNTER — Telehealth: Payer: Self-pay | Admitting: Gastroenterology

## 2020-11-05 NOTE — Telephone Encounter (Signed)
Hey Dr Meridee Score, this pt is being referred from [facility] for an EUS for an esophageal lesion. Pt was last seen by Dr Maximino Greenland 10/22/2020, records are in epic for review, please advise on scheduling, thank you!

## 2020-11-10 NOTE — Telephone Encounter (Signed)
Patty, Please move forward with scheduling an EGD/EUS with Dr. Christella Hartigan or myself for esophageal submucosal lesion evaluation.  Negative CT chest for duplication cyst in the middle of 2021.  Okay to move forward with EUS. Thanks. GM

## 2020-11-11 ENCOUNTER — Other Ambulatory Visit: Payer: Self-pay

## 2020-11-11 DIAGNOSIS — K229 Disease of esophagus, unspecified: Secondary | ICD-10-CM

## 2020-11-11 NOTE — Telephone Encounter (Signed)
Left message on machine to call back  

## 2020-11-11 NOTE — Telephone Encounter (Signed)
EGD EUS scheduled for 12/25/20 at [facility] with Dr Christella Hartigan at 730 am COVID test on 12/22/20 at 1030 am

## 2020-11-11 NOTE — Telephone Encounter (Signed)
EUS scheduled, pt instructed and medications reviewed.  Patient instructions mailed to home.  Patient to call with any questions or concerns.  

## 2020-12-16 ENCOUNTER — Ambulatory Visit (INDEPENDENT_AMBULATORY_CARE_PROVIDER_SITE_OTHER): Payer: Commercial Managed Care - PPO | Admitting: Neurology

## 2020-12-16 ENCOUNTER — Telehealth: Payer: Self-pay | Admitting: Neurology

## 2020-12-16 ENCOUNTER — Encounter: Payer: Self-pay | Admitting: Neurology

## 2020-12-16 VITALS — BP 127/75 | HR 68 | Ht 62.0 in | Wt 140.0 lb

## 2020-12-16 DIAGNOSIS — R202 Paresthesia of skin: Secondary | ICD-10-CM

## 2020-12-16 DIAGNOSIS — G43101 Migraine with aura, not intractable, with status migrainosus: Secondary | ICD-10-CM | POA: Diagnosis not present

## 2020-12-16 MED ORDER — UBRELVY 50 MG PO TABS: 50.0000 mg | ORAL_TABLET | ORAL | 3 refills | Status: DC | PRN

## 2020-12-16 NOTE — Telephone Encounter (Signed)
[facility]: [id number] (exp. 12/16/20 to 01/15/21)  Patient is scheduled at [facility] for Friday 12/26/20 to arrive at 4:30 pm. Patient is aware of time & day. She also has their number of [phone] incase she needed to r/s.

## 2020-12-16 NOTE — Patient Instructions (Signed)
It was nice to meet you today.  I am glad to hear that you are tingling has improved.  Since you have a longstanding history of migraines and never had a brain scan and given your recent symptoms of tingling in your extremities and face, I would like to proceed with a brain MRI with and without contrast to rule out a structural cause for your symptoms.  You likely have migraines with aura.   Sometimes taking oral contraceptive pills with estrogen can cause more complicated migraines or frequent migraines in women.  Please talk to courting about the possibility of changing your oral contraceptive pill to perhaps a progesterone only based OCP.  Sometimes straining the eyes can be a trigger for worsening headaches.  Since you have prescription eyeglasses but have not had a formal eye examination in over 3 years, I recommend that you get an eye exam done and get an updated prescription for your eyeglasses if need be.  Please remember, common headache triggers are: sleep deprivation, dehydration, overheating, stress, hypoglycemia or skipping meals and blood sugar fluctuations, excessive pain medications or excessive alcohol use or caffeine withdrawal. Some people have food triggers such as aged cheese, orange juice or chocolate, especially dark chocolate, or MSG (monosodium glutamate). Try to avoid these headache triggers as much possible. It may be helpful to keep a headache diary to figure out what makes your headaches worse or brings them on and what alleviates them. Some people report headache onset after exercise but studies have shown that regular exercise may actually prevent headaches from coming. If you have exercise-induced headaches, please make sure that you drink plenty of fluid before and after exercising and that you do not over do it and do not overheat.  I recommend for as needed use for an acute migraine, take Ubrelvy, 50 mg strength: Take 1 pill at onset of migraine headache, may repeat in 2  hours, no more than 4 pills in 24 hours, i.e. not to exceed 200 mg per 24 hours (see package insert).  May cause sedation and nausea.   We can certainly consider a day-to-day prevention either in an oral form or injectable form in the near future.  There are many options for migraine prevention.  We will call you with your MRI results.  Please follow-up routinely in 3 months, sooner if needed.  You can call anytime with interim questions or concerns or email [place] through MyChart.

## 2020-12-16 NOTE — Progress Notes (Signed)
Subjective:    Patient ID: Uriah M Mauss is a 20 y.o. female.  HPI     History:   Dear Toni Amendourtney,   I saw your Selinda Michaelspatient, Donnie AhoKayla Dorfman, upon your kind request in my neurologic clinic today for initial consultation of her recurrent headaches, concern for migraines.  The patient is unaccompanied today.  As you know, Ms. Randalyn RheaVickers is a 20 year old right-handed woman with an underlying medical history of reflux disease, anxiety, depression, history of bursitis, and recent status post cholecystectomy, who reports a longstanding history of migraines since her preteen years.  She has a strong family history on her mom's side of migraines including mom, several aunts, and great aunts.  In December, she had a prolonged migraine that lasted about 2 weeks, she had associated tingling, she started having a mild headache while at work, she works in a coffee shop.  She noticed that she had numbness and tingling in both hands and it expanded to involve both arms, both legs, including her face.  She did not seek immediate medical attention at the time.  She did have a severe migraine at the time including nausea and vomiting.  When she has a migraine she typically also has photophobia and sonophobia.  Migraines tend to be behind her eyes and extend upwards to the center of her head and sometimes go backwards to the neck or base of the head.  She has no residual tingling or permanent numbness.  She has not had any weakness or slurring of speech or sudden onset of facial droop.  She does have occasional visual auras with her migraines including flickering lines, floaters, even tunnel vision.  She has prescription eyeglasses and reports that she has not had a formal eye examination in over 3 years.  She tries to hydrate well.  She tries to get 8 hours of sleep.  Triggers may include stress, occasional dehydration, and overheating.  She currently reports a migraine frequency of once every 2 weeks but they can last up to 3  days.  She does not have much in the way of tingling, certainly not as consistent as in December 2021.   I reviewed your telemedicine note from 10/10/2020.  He reported increase in her headaches.  She reported intermittent tingling in her face, arms and legs.  She has been on sertraline, 50 mg strength 1-1/2 pills daily.  She has been on oral contraceptive pills.  She is taking pantoprazole for reflux.  She has been on propranolol 10 mg strength once daily. She drinks caffeine in the form of coffee, 2 cups/day, typically no later than lunchtime.  She drinks alcohol rarely to none.  She does smoke marijuana occasionally, does not smoke cigarettes.  She lives with her parents.  Her Past Medical History Is Significant For: Past Medical History:  Diagnosis Date  . Bursitis    hips  . GERD (gastroesophageal reflux disease)   . Migraine headache    approx 2/month    Her Past Surgical History Is Significant For: Past Surgical History:  Procedure Laterality Date  . BIOPSY N/A 04/15/2020   Procedure: BIOPSY;  Surgeon: Pasty Spillersahiliani, Varnita B, MD;  [facility];  Service: Endoscopy;  Laterality: N/A;  . CHOLECYSTECTOMY N/A 07/10/2020   Procedure: LAPAROSCOPIC CHOLECYSTECTOMY WITH INTRAOPERATIVE CHOLANGIOGRAM;  Surgeon: Franky MachoJenkins, Mark, MD;  Location: [facility];  Service: General;  Laterality: N/A;  . DENTAL SURGERY    . ERCP N/A 07/11/2020   Procedure: ENDOSCOPIC RETROGRADE CHOLANGIOPANCREATOGRAPHY (ERCP);  Surgeon: Karilyn Cotaehman,  Joline Maxcy, MD;  Location: [facility];  Service: Endoscopy;  Laterality: N/A;  . ESOPHAGOGASTRODUODENOSCOPY (EGD) WITH PROPOFOL N/A 04/15/2020   Procedure: ESOPHAGOGASTRODUODENOSCOPY (EGD) WITH PROPOFOL;  Surgeon: Pasty Spillers, MD;  Location: [facility];  Service: Endoscopy;  Laterality: N/A;  . SPHINCTEROTOMY N/A 07/11/2020   Procedure: SPHINCTEROTOMY PYLORICE CHANNEL DILALATION , NORMAL SIZE BILE DUCT;  Surgeon: Malissa Hippo, MD;  Location: [facility];  Service: Endoscopy;  Laterality: N/A;  . STONE EXTRACTION WITH BASKET N/A 07/11/2020   Procedure: STONE EXTRACTION WITH WITH BALLOON;  Surgeon: Malissa Hippo, MD;  Location: [facility];  Service: Endoscopy;  Laterality: N/A;  . TYMPANOSTOMY TUBE PLACEMENT      Her Family History Is Significant For: Family History  Problem Relation Age of Onset  . Gastric cancer Maternal Grandfather        "not sure where, but somewhere in the stomach area"  . Colon cancer Neg Hx     Her Social History Is Significant For: Social History   Socioeconomic History  . Marital status: Single    Spouse name: Not on file  . Number of children: Not on file  . Years of education: Not on file  . Highest education level: Not on file  Occupational History  . Not on file  Tobacco Use  . Smoking status: Former Smoker    Quit date: 12/03/2019    Years since quitting: 1.0  . Smokeless tobacco: Never Used  . Tobacco comment: Smoked less than 1 pack per week for about 3 months  Vaping Use  . Vaping Use: Never used  Substance and Sexual Activity  . Alcohol use: Not Currently    Comment: may have 1-2 drinks/month. None currently.   . Drug use: Yes    Types: Marijuana  . Sexual activity: Yes  Other Topics Concern  . Not on file  Social History Narrative  . Not on file   Social Determinants of Health   Financial Resource Strain: Not on file  Food Insecurity: Not on file  Transportation Needs: Not on file  Physical Activity: Not on file  Stress: Not on file  Social Connections: Not on file    Her Allergies Are:  Allergies  Allergen Reactions  . Peanut-Containing Drug Products Hives and Swelling    Peanuts  :   Her Current Medications Are:  Outpatient Encounter Medications as of 12/16/2020  Medication Sig  . AUROVELA FE 1.5/30 1.5-30 MG-MCG tablet Take 1 tablet by mouth daily.  Marland Kitchen dicyclomine (BENTYL) 20 MG tablet Take 1 tablet (20 mg total) by mouth 2 (two) times daily.  .  promethazine (PHENERGAN) 25 MG tablet Take 1 tablet (25 mg total) by mouth every 6 (six) hours as needed for nausea or vomiting.  . propranolol (INDERAL) 10 MG tablet Take 10 mg by mouth daily as needed (If blood pressure rises).   . sertraline (ZOLOFT) 50 MG tablet Take 75 mg by mouth daily.  Marland Kitchen Ubrogepant (UBRELVY) 50 MG TABS Take 50 mg by mouth as needed (may repeat once in 2 hours. no more than 2 pills in 24 h.).  Marland Kitchen pantoprazole (PROTONIX) 40 MG tablet Take 1 tablet (40 mg total) by mouth 2 (two) times daily.  . [DISCONTINUED] famotidine (PEPCID) 20 MG tablet Take 1 tablet (20 mg total) by mouth 2 (two) times daily.   No facility-administered encounter medications on file as of 12/16/2020.  :  Review of Systems:  Out of a complete 14  point review of systems, all are reviewed and negative with the exception of these symptoms as listed below: Review of Systems  Neurological:       Here to discuss worsening migraines. Pt reports worst time was in December. Pt reports associated numbness/tingling, nausea, and vomiting. Pt reports she typically otc ibuprofen to help when sx are at the worst.     Objective:  Neurological Exam  Physical Exam Physical Examination:   Vitals:   12/16/20 0803  BP: 127/75  Pulse: 68    General Examination: The patient is a very pleasant 20 y.o. female in no acute distress. She appears well-developed and well-nourished and well groomed.   HEENT: Normocephalic, atraumatic, pupils are equal, round and reactive to light and accommodation. Funduscopic exam is normal with sharp disc margins noted. Extraocular tracking is good without limitation to gaze excursion or nystagmus noted. Normal smooth pursuit is noted. Hearing is grossly intact. Face is symmetric with normal facial animation and normal facial sensation. Speech is clear with no dysarthria noted. There is no hypophonia. There is no lip, neck/head, jaw or voice tremor. Neck is supple with full range of passive  and active motion. There are no carotid bruits on auscultation. Oropharynx exam reveals: no mouth dryness, good dental hygiene, tonsils are about 2+.  Tongue protrudes centrally and palate elevates symmetrically.  Negative Lhermitte's.  Chest: Clear to auscultation without wheezing, rhonchi or crackles noted.  Heart: S1+S2+0, regular and normal without murmurs, rubs or gallops noted.   Abdomen: Soft, non-tender and non-distended with normal bowel sounds appreciated on auscultation.  Extremities: There is no pitting edema in the distal lower extremities bilaterally. Pedal pulses are intact.  Skin: Warm and dry without trophic changes noted.  Musculoskeletal: exam reveals no obvious joint deformities, tenderness or joint swelling or erythema.   Neurologically:  Mental status: The patient is awake, alert and oriented in all 4 spheres. Her immediate and remote memory, attention, language skills and fund of knowledge are appropriate. There is no evidence of aphasia, agnosia, apraxia or anomia. Speech is clear with normal prosody and enunciation. Thought process is linear. Mood is normal and affect is normal.  Cranial nerves II - XII are as described above under HEENT exam. In addition: shoulder shrug is normal with equal shoulder height noted. Motor exam: Normal bulk, strength and tone is noted. There is no drift, tremor or rebound. Romberg is negative. Reflexes are 2+ throughout. Babinski: Toes are flexor bilaterally. Fine motor skills and coordination: intact with normal finger taps, normal hand movements, normal rapid alternating patting, normal foot taps and normal foot agility.  Cerebellar testing: No dysmetria or intention tremor on finger to nose testing. Heel to shin is unremarkable bilaterally. There is no truncal or gait ataxia.  Sensory exam: intact to light touch, vibration, temperature sense in the upper and lower extremities.  Gait, station and balance: She stands easily. No veering to  one side is noted. No leaning to one side is noted. Posture is age-appropriate and stance is narrow based. Gait shows normal stride length and normal pace. No problems turning are noted. Tandem walk is unremarkable. Intact toe and heel stance is noted.               Assessment and Plan:   In summary, TANNISHA KENNINGTON is a very pleasant 20 y.o.-year old female with an underlying medical history of reflux disease, anxiety, depression, history of bursitis, and recent status post cholecystectomy, who presents for evaluation of her migraines.  She reports a prior history of migraines, probably since her preteen years along with a family history of migraines.  She recently had associated paresthesias with one of her prolonged migraines.  She has a nonfocal neurological exam.  She likely has migraines with aura.  She is advised that sometimes oral contraceptive pills containing estrogen can cause more complex migraines or auras with migraines.  She is advised to talk to you about potential oral contraceptive pill options at have very low or no estrogen.  Since she had recent paresthesias, I would like to proceed with a brain MRI with and without contrast to rule out a structural cause of her symptoms.  Furthermore, she has prescription eyeglasses but has not had an eye examination in over 3 years.  She is advised to seek a formal eye exam and updated prescription for her eyeglasses if need be.  She is furthermore advised regarding headache and migraine triggers.  We talked about treatment options including preventative treatment and abortive treatment options.  She is advised to start Ubrelvy for acute use.  She typically takes over-the-counter medication which often does help but she certainly has specific symptoms pertaining to migraines that would respond well to migraine medication I believe.  She is advised regarding the use of Ubrelvy, its limitations, common side effects.  She was given written instructions and a  new prescription.  We talked about utilizing a preventative in the near future if the need arises.  We could consider an oral daily preventative, neuro check which is an every other day oral preventative, Bennie Pierini may be a good option for daily oral prevention and of course we also have the once a month injectable migraine preventatives.  Traditional preventatives include antidepressant medication, Topamax, Depakote, calcium channel blocker and beta-blocker.  I would favor one of the newer medications for her.  We will pick up our discussion in the near future.  For now, we will proceed with Ubrelvy as needed, 50 mg strength take at onset of migraine and may repeat after 2 hours once.  We will call her with her brain MRI results and plan to follow-up in this clinic in 3 months, sooner if needed.  I answered all her questions today and she was in agreement with the plan. Thank you very much for allowing me to participate in the care of this nice patient. If I can be of any further assistance to you please do not hesitate to call me at [phone].  Sincerely,   Huston Foley, MD, PhD

## 2020-12-17 ENCOUNTER — Telehealth: Payer: Self-pay

## 2020-12-17 LAB — COMPREHENSIVE METABOLIC PANEL
ALT: 9 IU/L (ref 0–32)
AST: 15 IU/L (ref 0–40)
Albumin/Globulin Ratio: 1.9 (ref 1.2–2.2)
Albumin: 4.7 g/dL (ref 3.9–5.0)
Alkaline Phosphatase: 65 IU/L (ref 42–106)
BUN/Creatinine Ratio: 14 (ref 9–23)
BUN: 9 mg/dL (ref 6–20)
Bilirubin Total: 0.3 mg/dL (ref 0.0–1.2)
CO2: 19 mmol/L — ABNORMAL LOW (ref 20–29)
Calcium: 9.5 mg/dL (ref 8.7–10.2)
Chloride: 105 mmol/L (ref 96–106)
Creatinine, Ser: 0.66 mg/dL (ref 0.57–1.00)
Globulin, Total: 2.5 g/dL (ref 1.5–4.5)
Glucose: 86 mg/dL (ref 65–99)
Potassium: 4.7 mmol/L (ref 3.5–5.2)
Sodium: 141 mmol/L (ref 134–144)
Total Protein: 7.2 g/dL (ref 6.0–8.5)
eGFR: 129 mL/min/{1.73_m2} (ref >59)

## 2020-12-17 NOTE — Telephone Encounter (Signed)
I called the pt and left vm advising of lab results. Pt advised to call back if she had any questions.

## 2020-12-17 NOTE — Progress Notes (Signed)
Labs look okay, okay to proceed with MRI as planned.

## 2020-12-17 NOTE — Telephone Encounter (Signed)
-----   Message from Huston Foley, MD sent at 12/17/2020  3:25 PM EDT ----- Labs look okay, okay to proceed with MRI as planned.

## 2020-12-18 NOTE — Progress Notes (Signed)
Attempted to obtain medical history via telephone, unable to reach at this time. I left a voicemail to return pre surgical testing department's phone call.  

## 2020-12-22 ENCOUNTER — Other Ambulatory Visit (HOSPITAL_COMMUNITY): Payer: Commercial Managed Care - PPO

## 2020-12-24 ENCOUNTER — Other Ambulatory Visit: Payer: Self-pay

## 2020-12-24 ENCOUNTER — Other Ambulatory Visit (HOSPITAL_COMMUNITY)
Admission: RE | Admit: 2020-12-24 | Discharge: 2020-12-24 | Disposition: A | Discharge status: Home / Self Care | Payer: Commercial Managed Care - PPO | Source: Ambulatory Visit | Attending: Gastroenterology | Admitting: Gastroenterology

## 2020-12-24 ENCOUNTER — Encounter (HOSPITAL_COMMUNITY): Payer: Self-pay | Admitting: Gastroenterology

## 2020-12-24 DIAGNOSIS — Z20822 Contact with and (suspected) exposure to covid-19: Secondary | ICD-10-CM | POA: Insufficient documentation

## 2020-12-24 DIAGNOSIS — Z01812 Encounter for preprocedural laboratory examination: Secondary | ICD-10-CM | POA: Insufficient documentation

## 2020-12-24 LAB — SARS CORONAVIRUS 2 (TAT 6-24 HRS): SARS Coronavirus 2: NEGATIVE

## 2020-12-24 NOTE — Progress Notes (Signed)
Annette Gould just returned call back from 12/18/20 history completed, but she did not get her COVID swab test completed. Informed her she must get it completed by 3PM today in order for procedure not to be rescheduled. She verbalized understanding.

## 2020-12-24 NOTE — Anesthesia Preprocedure Evaluation (Addendum)
Anesthesia Evaluation  Patient identified by MRN, date of birth, ID band Patient awake    Reviewed: Allergy & Precautions, NPO status , Patient's Chart, lab work & pertinent test results  History of Anesthesia Complications (+) PONV and history of anesthetic complications  Airway Mallampati: I       Dental no notable dental hx.    Pulmonary neg pulmonary ROS, former smoker,    Pulmonary exam normal        Cardiovascular negative cardio ROS Normal cardiovascular exam     Neuro/Psych  Headaches, PSYCHIATRIC DISORDERS Anxiety    GI/Hepatic Neg liver ROS,   Endo/Other  negative endocrine ROS  Renal/GU negative Renal ROS     Musculoskeletal negative musculoskeletal ROS (+)   Abdominal Normal abdominal exam  (+)   Peds  Hematology negative hematology ROS (+)   Anesthesia Other Findings   Reproductive/Obstetrics negative OB ROS                            Anesthesia Physical Anesthesia Plan  ASA: II  Anesthesia Plan: MAC   Post-op Pain Management:    Induction:   PONV Risk Score and Plan: Propofol infusion, Ondansetron and Dexamethasone  Airway Management Planned: Natural Airway and Mask  Additional Equipment: None  Intra-op Plan:   Post-operative Plan:   Informed Consent: I have reviewed the patients History and Physical, chart, labs and discussed the procedure including the risks, benefits and alternatives for the proposed anesthesia with the patient or authorized representative who has indicated his/her understanding and acceptance.     Dental advisory given  Plan Discussed with: CRNA  Anesthesia Plan Comments:       Anesthesia Quick Evaluation

## 2020-12-25 ENCOUNTER — Ambulatory Visit (HOSPITAL_COMMUNITY): Payer: Commercial Managed Care - PPO | Admitting: Anesthesiology

## 2020-12-25 ENCOUNTER — Ambulatory Visit (HOSPITAL_COMMUNITY)
Admission: RE | Admit: 2020-12-25 | Discharge: 2020-12-25 | Disposition: A | Discharge status: Home / Self Care | Payer: Commercial Managed Care - PPO | Attending: Gastroenterology | Admitting: Gastroenterology

## 2020-12-25 ENCOUNTER — Encounter (HOSPITAL_COMMUNITY)
Admission: RE | Disposition: A | Discharge status: Home / Self Care | Payer: Self-pay | Source: Home / Self Care | Attending: Gastroenterology

## 2020-12-25 ENCOUNTER — Encounter (HOSPITAL_COMMUNITY): Payer: Self-pay | Admitting: Gastroenterology

## 2020-12-25 ENCOUNTER — Other Ambulatory Visit: Payer: Self-pay

## 2020-12-25 DIAGNOSIS — K2289 Other specified disease of esophagus: Secondary | ICD-10-CM

## 2020-12-25 DIAGNOSIS — Z800 Family history of malignant neoplasm of digestive organs: Secondary | ICD-10-CM | POA: Insufficient documentation

## 2020-12-25 DIAGNOSIS — Z87891 Personal history of nicotine dependence: Secondary | ICD-10-CM | POA: Insufficient documentation

## 2020-12-25 DIAGNOSIS — K222 Esophageal obstruction: Secondary | ICD-10-CM | POA: Diagnosis present

## 2020-12-25 HISTORY — DX: Other specified postprocedural states: Z98.890

## 2020-12-25 HISTORY — PX: EUS: SHX5427

## 2020-12-25 HISTORY — PX: ESOPHAGOGASTRODUODENOSCOPY (EGD) WITH PROPOFOL: SHX5813

## 2020-12-25 HISTORY — DX: Other specified postprocedural states: R11.2

## 2020-12-25 LAB — PREGNANCY, URINE: Preg Test, Ur: NEGATIVE

## 2020-12-25 SURGERY — UPPER ENDOSCOPIC ULTRASOUND (EUS) RADIAL
Anesthesia: Monitor Anesthesia Care

## 2020-12-25 MED ORDER — PROPOFOL 1000 MG/100ML IV EMUL
INTRAVENOUS | Status: AC
  Filled 2020-12-25: qty 400

## 2020-12-25 MED ORDER — PROPOFOL 500 MG/50ML IV EMUL
INTRAVENOUS | Status: AC
  Filled 2020-12-25: qty 50

## 2020-12-25 MED ORDER — PROPOFOL 500 MG/50ML IV EMUL
INTRAVENOUS | Status: AC
  Filled 2020-12-25: qty 200

## 2020-12-25 MED ORDER — PROPOFOL 500 MG/50ML IV EMUL
INTRAVENOUS | Status: DC | PRN
  Administered 2020-12-25: 75 ug/kg/min via INTRAVENOUS

## 2020-12-25 MED ORDER — LACTATED RINGERS IV SOLN
INTRAVENOUS | Status: DC
  Administered 2020-12-25: 1000 mL via INTRAVENOUS

## 2020-12-25 SURGICAL SUPPLY — 14 items

## 2020-12-25 NOTE — Addendum Note (Signed)
Addendum  created 12/25/20 7062 by Leilani Able, MD   Clinical Note Signed

## 2020-12-25 NOTE — Anesthesia Postprocedure Evaluation (Addendum)
Anesthesia Post Note  Patient: Annette Gould  Procedure(s) Performed: UPPER ENDOSCOPIC ULTRASOUND (EUS) RADIAL (N/A ) ESOPHAGOGASTRODUODENOSCOPY (EGD) WITH PROPOFOL (N/A )     Patient location during evaluation: Endoscopy Anesthesia Type: MAC Level of consciousness: awake and sedated Pain management: pain level controlled Vital Signs Assessment: post-procedure vital signs reviewed and stable Respiratory status: spontaneous breathing Cardiovascular status: stable Postop Assessment: no apparent nausea or vomiting Anesthetic complications: no   No complications documented.  Last Vitals:  Vitals:   12/25/20 0820 12/25/20 0824  BP: (!) 108/56 101/62  Pulse: 74 68  Resp: 18 12  Temp:    SpO2: 99% 99%    Last Pain:  Vitals:   12/25/20 0824  TempSrc:   PainSc: 0-No pain                 Huston Foley

## 2020-12-25 NOTE — Op Note (Signed)
[facility] Patient Name: Annette Gould Procedure Date: 12/25/2020 MRN: [id number] Attending MD: Rachael Fee , MD Date of Birth: [birth year] CSN: [id number] Age: 20 Admit Type: Outpatient Procedure:                Upper EUS Indications:              asymptomatic proximal esophageal compression noted                            04/2020 EGD; CT of chest and neck 2021 were normal.                            No weight loss Providers:                Rachael Fee, MD, Dwain Sarna, RN, Beryle Beams, Technician Referring MD:             Nila Nephew, MD Medicines:                Monitored Anesthesia Care Complications:            No immediate complications. Estimated blood loss:                            None. Estimated Blood Loss:     Estimated blood loss: none. Procedure:                Pre-Anesthesia Assessment:                           - Prior to the procedure, a History and Physical                            was performed, and patient medications and                            allergies were reviewed. The patient's tolerance of                            previous anesthesia was also reviewed. The risks                            and benefits of the procedure and the sedation                            options and risks were discussed with the patient.                            All questions were answered, and informed consent                            was obtained. Prior Anticoagulants: The patient has  taken no previous anticoagulant or antiplatelet                            agents. ASA Grade Assessment: I - A normal, healthy                            patient. After reviewing the risks and benefits,                            the patient was deemed in satisfactory condition to                            undergo the procedure.                           After obtaining informed consent, the endoscope was                             passed under direct vision. Throughout the                            procedure, the patient's blood pressure, pulse, and                            oxygen saturations were monitored continuously. The                            [id number] ([id number]) Olympus was introduced through                            the mouth, and advanced to the body of the stomach.                            The upper EUS was accomplished without difficulty.                            The patient tolerated the procedure well. Scope In: Scope Out: Findings:      ENDOSCOPIC FINDING: :      There was a mobile, tubular, clearly extrinsic compression involving the       proximal esophagus around 24cm from the incisor.      The entire examined stomach was endoscopically normal.      ENDOSONOGRAPHIC FINDING: :      1. The area of the extrinsic compression noted above was normal. There       are no subepithelial lesions involving the esophageal wall. The       vasculature and airways around that segment of the esophagus all       appeared normal. There was no paraesophageal lymphadenopathy.      2. Limited views of the liver, pancreas, splee, portal vessels were all       normal. Impression:               - The area of the extrinsic compression in the  proximal esophageal was normal. There are no                            subepithelial lesions involving the esophageal                            wall. The vasculature and airways around that                            segment of the esophagus all appear normal. There                            was no paraesophageal lymphadenopathy. She is                            asymptomatic. CT scans of the chest and neck in                            2021 were normal.                           - I suspect that the extrinsic compression noted in                            the esopahgus is related to an exagerated but                             normal indentation from either the nearby airway or                            blood vessels. Moderate Sedation:      Not Applicable - Patient had care per Anesthesia. Recommendation:           - Discharge patient to home.                           - No further testing is necessary for this issue. Procedure Code(s):        --- Professional ---                           [phone], 52, Esophagogastroduodenoscopy, flexible,                            transoral; with endoscopic ultrasound examination                            limited to the esophagus, stomach or duodenum, and                            adjacent structures Diagnosis Code(s):        --- Professional ---                           K22.8, Other specified diseases of esophagus CPT copyright 2019 [employer]. All rights reserved. The  codes documented in this report are preliminary and upon coder review may  be revised to meet current compliance requirements. Rachael Fee, MD 12/25/2020 8:23:59 AM This report has been signed electronically. Number of Addenda: 0

## 2020-12-25 NOTE — H&P (Signed)
HPI: This is a 20 yo woman with proximal esophageal extrinsic compression  This is asymptomatic  Ct scans of chest and neck show no esophageal tumors   ROS: complete GI ROS as described in HPI, all other review negative.  Constitutional:  No unintentional weight loss   Past Medical History:  Diagnosis Date  . Bursitis    hips  . GERD (gastroesophageal reflux disease)   . Migraine headache    approx 2/month  . PONV (postoperative nausea and vomiting)     Past Surgical History:  Procedure Laterality Date  . BIOPSY N/A 04/15/2020   Procedure: BIOPSY;  Surgeon: Pasty Spillers, MD;  Location: [facility];  Service: Endoscopy;  Laterality: N/A;  . CHOLECYSTECTOMY N/A 07/10/2020   Procedure: LAPAROSCOPIC CHOLECYSTECTOMY WITH INTRAOPERATIVE CHOLANGIOGRAM;  Surgeon: [name], MD;  Location: [facility];  Service: General;  Laterality: N/A;  . DENTAL SURGERY    . ERCP N/A 07/11/2020   Procedure: ENDOSCOPIC RETROGRADE CHOLANGIOPANCREATOGRAPHY (ERCP);  Surgeon: Malissa Hippo, MD;  Location: [facility];  Service: Endoscopy;  Laterality: N/A;  . ESOPHAGOGASTRODUODENOSCOPY (EGD) WITH PROPOFOL N/A 04/15/2020   Procedure: ESOPHAGOGASTRODUODENOSCOPY (EGD) WITH PROPOFOL;  Surgeon: Pasty Spillers, MD;  Location: [facility];  Service: Endoscopy;  Laterality: N/A;  . SPHINCTEROTOMY N/A 07/11/2020   Procedure: SPHINCTEROTOMY PYLORICE CHANNEL DILALATION , NORMAL SIZE BILE DUCT;  Surgeon: Malissa Hippo, MD;  Location: [facility];  Service: Endoscopy;  Laterality: N/A;  . STONE EXTRACTION WITH BASKET N/A 07/11/2020   Procedure: STONE EXTRACTION WITH WITH BALLOON;  Surgeon: Malissa Hippo, MD;  Location: [facility];  Service: Endoscopy;  Laterality: N/A;  . TYMPANOSTOMY TUBE PLACEMENT      Current Facility-Administered Medications  Medication Dose Route Frequency Provider Last Rate Last Admin  . lactated ringers infusion   Intravenous Continuous Rachael Fee, MD        Allergies as of 11/11/2020 - Review Complete 10/24/2020  Allergen Reaction Noted  . Peanut-containing drug products Hives and Swelling 04/10/2020    Family History  Problem Relation Age of Onset  . Gastric cancer Maternal Grandfather        "not sure where, but somewhere in the stomach area"  . Colon cancer Neg Hx     Social History   Socioeconomic History  . Marital status: Single    Spouse name: Not on file  . Number of children: Not on file  . Years of education: Not on file  . Highest education level: Not on file  Occupational History  . Not on file  Tobacco Use  . Smoking status: Former Smoker    Quit date: 12/03/2019    Years since quitting: 1.0  . Smokeless tobacco: Never Used  . Tobacco comment: Smoked less than 1 pack per week for about 3 months  Vaping Use  . Vaping Use: Never used  Substance and Sexual Activity  . Alcohol use: Not Currently    Comment: may have 1-2 drinks/month. None currently.   . Drug use: Yes    Types: Marijuana  . Sexual activity: Yes  Other Topics Concern  . Not on file  Social History Narrative  . Not on file   Social Determinants of Health   Financial Resource Strain: Not on file  Food Insecurity: Not on file  Transportation Needs: Not on file  Physical Activity: Not on file  Stress: Not on file  Social Connections: Not on file  Intimate Partner Violence: Not on  file     Physical Exam: BP (!) 123/54   Pulse 67   Temp 98.7 F (37.1 C) (Oral)   Resp 16   Ht 5\' 2"  (1.575 m)   Wt 63.5 kg   SpO2 100%   BMI 25.61 kg/m  Constitutional: generally well-appearing Psychiatric: alert and oriented x3 Abdomen: soft, nontender, nondistended, no obvious ascites, no peritoneal signs, normal bowel sounds No peripheral edema noted in lower extremities  Assessment and plan: 20 y.o. female with asymptomatic esophageal extrinsic compression  For upper EUS evaluation today  Please see the "Patient Instructions"  section for addition details about the plan.  35, MD [facility] 12/25/2020, 7:19 AM

## 2020-12-25 NOTE — Discharge Instructions (Signed)
YOU HAD AN ENDOSCOPIC PROCEDURE TODAY: Refer to the procedure report and other information in the discharge instructions given to you for any specific questions about what was found during the examination. If this information does not answer your questions, please call [place] office at [phone] to clarify.  ° °YOU SHOULD EXPECT: Some feelings of bloating in the abdomen. Passage of more gas than usual. Walking can help get rid of the air that was put into your GI tract during the procedure and reduce the bloating.  ° °DIET: Your first meal following the procedure should be a light meal and then it is ok to progress to your normal diet. A half-sandwich or bowl of soup is an example of a good first meal. Heavy or fried foods are harder to digest and may make you feel nauseous or bloated. Drink plenty of fluids but you should avoid alcoholic beverages for 24 hours. ° °ACTIVITY: Your care partner should take you home directly after the procedure. You should plan to take it easy, moving slowly for the rest of the day. You can resume normal activity the day after the procedure however YOU SHOULD NOT DRIVE, use power tools, machinery or perform tasks that involve climbing or major physical exertion for 24 hours (because of the sedation medicines used during the test).  ° °SYMPTOMS TO REPORT IMMEDIATELY: °A gastroenterologist can be reached at any hour. Please call [phone]  for any of the following symptoms:  ° °Following upper endoscopy (EGD, EUS, ERCP, esophageal dilation) °Vomiting of blood or coffee ground material  °New, significant abdominal pain  °New, significant chest pain or pain under the shoulder blades  °Painful or persistently difficult swallowing  °New shortness of breath  °Black, tarry-looking or red, bloody stools ° °FOLLOW UP:  °If any biopsies were taken you will be contacted by phone or by letter within the next 1-3 weeks. Call [phone]  if you have not heard about the biopsies in 3 weeks.    °Please also call with any specific questions about appointments or follow up tests. ° °

## 2020-12-25 NOTE — Transfer of Care (Signed)
Immediate Anesthesia Transfer of Care Note  Patient: Annette Gould  Procedure(s) Performed: Procedure(s): UPPER ENDOSCOPIC ULTRASOUND (EUS) RADIAL (N/A) ESOPHAGOGASTRODUODENOSCOPY (EGD) WITH PROPOFOL (N/A)  Patient Location: PACU  Anesthesia Type:General  Level of Consciousness: Alert, Awake, Oriented  Airway & Oxygen Therapy: Patient Spontanous Breathing  Post-op Assessment: Report given to RN  Post vital signs: Reviewed and stable  Last Vitals:  Vitals:   12/25/20 0654  BP: (!) 123/54  Pulse: 67  Resp: 16  Temp: 37.1 C  SpO2: 100%    Complications: No apparent anesthesia complications

## 2020-12-26 ENCOUNTER — Encounter (HOSPITAL_COMMUNITY): Payer: Self-pay | Admitting: Gastroenterology

## 2020-12-26 ENCOUNTER — Ambulatory Visit (HOSPITAL_COMMUNITY): Payer: Commercial Managed Care - PPO | Attending: Neurology

## 2021-01-15 ENCOUNTER — Other Ambulatory Visit: Payer: Self-pay

## 2021-01-15 ENCOUNTER — Ambulatory Visit (HOSPITAL_COMMUNITY)
Admission: RE | Admit: 2021-01-15 | Discharge: 2021-01-15 | Disposition: A | Discharge status: Home / Self Care | Payer: Commercial Managed Care - PPO | Source: Ambulatory Visit | Attending: Neurology | Admitting: Neurology

## 2021-01-15 DIAGNOSIS — R202 Paresthesia of skin: Secondary | ICD-10-CM | POA: Insufficient documentation

## 2021-01-15 DIAGNOSIS — G43101 Migraine with aura, not intractable, with status migrainosus: Secondary | ICD-10-CM | POA: Insufficient documentation

## 2021-01-15 IMAGING — MR MR HEAD WO/W CM
15 of 17 series · 32 of 48 positions shown · IV contrast (gadavist)
Comparison: None.

CLINICAL DATA: Headaches.

EXAM:
MRI HEAD WITHOUT AND WITH CONTRAST
TECHNIQUE: Multiplanar, multiecho pulse sequences of the brain and surrounding
structures were obtained without and with intravenous contrast.
CONTRAST:  7mL GADAVIST GADOBUTROL 1 MMOL/ML IV SOLN

[Series 5: DWI · axial · 3.0mm · 0.77mm/px · z∈[-25,+115]mm · 2 of 48 slices shown (1 of 6)]
[im 1/48]
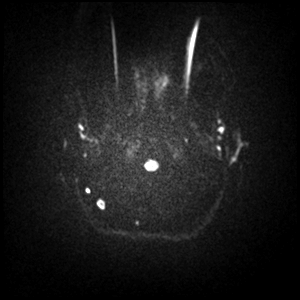
[im 48/48]
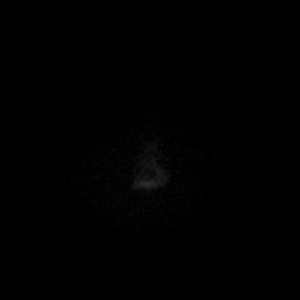

[Series 5: DWI · axial · 3.0mm · 0.77mm/px · z∈[-25,+115]mm · 3 of 48 slices shown (2 of 6)]
[im 1/48]
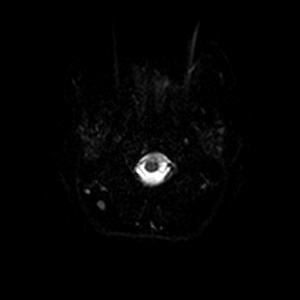
[im 24/48]
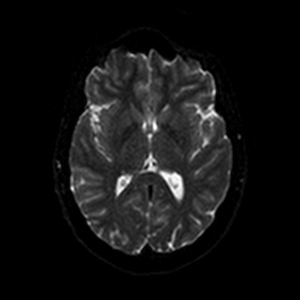
[im 48/48]
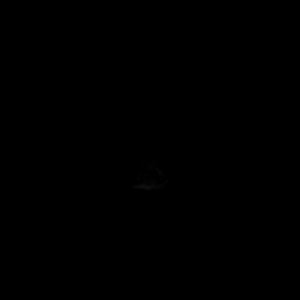

[Series 6: DWI · axial · 3.0mm · 0.77mm/px · z∈[-25,+115]mm · 3 of 48 slices shown (3 of 6)]
[im 1/48]
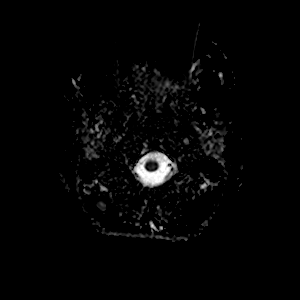
[im 24/48]
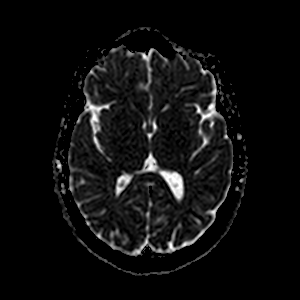
[im 48/48]
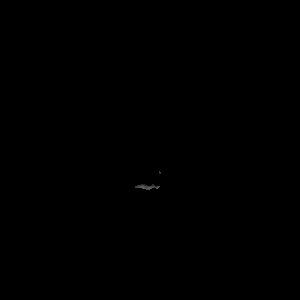

[Series 7: DWI · coronal · 5.0mm · 0.88mm/px · 2 of 28 slices shown (4 of 6)]
[im 1/28]
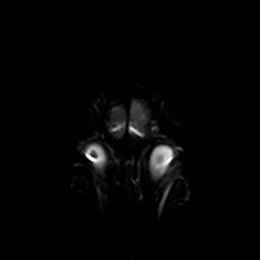
[im 28/28]
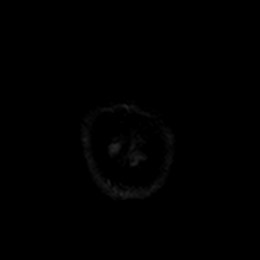

[Series 7: DWI · coronal · 5.0mm · 0.88mm/px · 2 of 28 slices shown (5 of 6)]
[im 1/28]
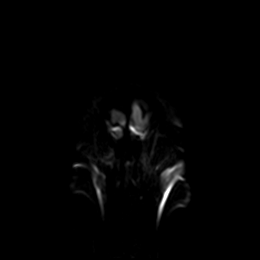
[im 28/28]
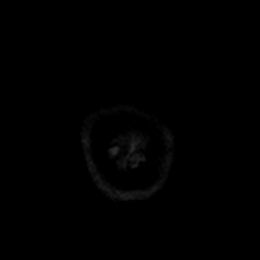

[Series 8: DWI · coronal · 5.0mm · 0.88mm/px · 2 of 28 slices shown (6 of 6)]
[im 1/28]
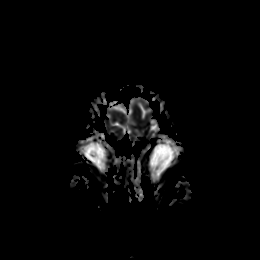
[im 28/28]
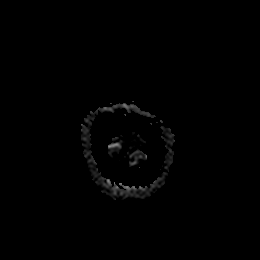

[Series 9: T1 · sagittal · 5.0mm · 0.75mm/px · 1 of 19 slices shown]
[im 1/19]
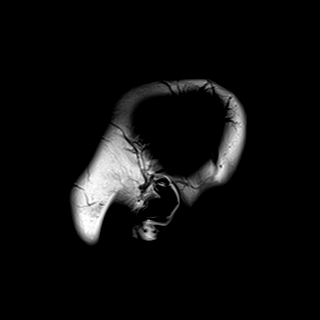

[Series 10: T2 · axial · 5.0mm · 0.72mm/px · 1 of 20 slices shown]
[im 1/20]
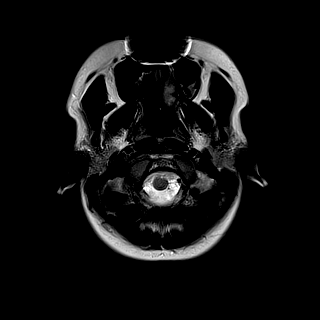

[Series 11: mag_images · axial · 3.0mm · 0.90mm/px · z∈[-38,+138]mm · 3 of 60 slices shown]
[im 1/60]
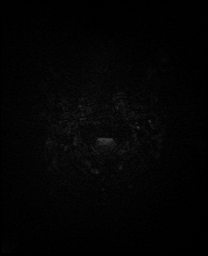
[im 30/60]
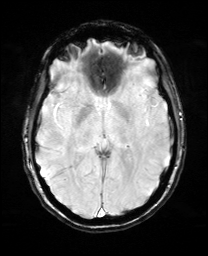
[im 60/60]
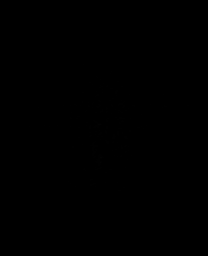

[Series 12: pha_images · axial · 3.0mm · 0.90mm/px · z∈[-38,+135]mm · 3 of 59 slices shown]
[im 1/59]
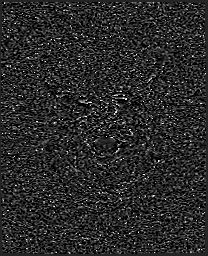
[im 30/59]
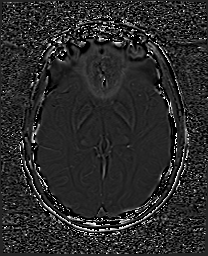
[im 59/59]
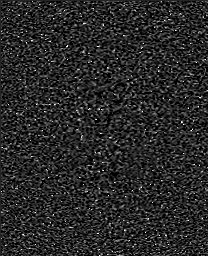

[Series 13: swi_images · axial · 3.0mm · 0.90mm/px · z∈[-38,+138]mm · 3 of 60 slices shown]
[im 1/60]
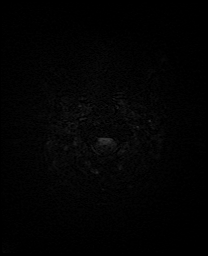
[im 30/60]
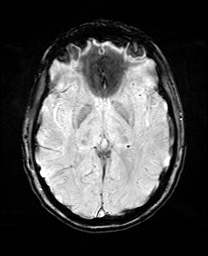
[im 60/60]
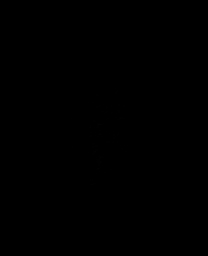

[Series 15: FLAIR · axial · 3.0mm · 0.45mm/px · z∈[-6,+111]mm · 2 of 40 slices shown]
[im 1/40]
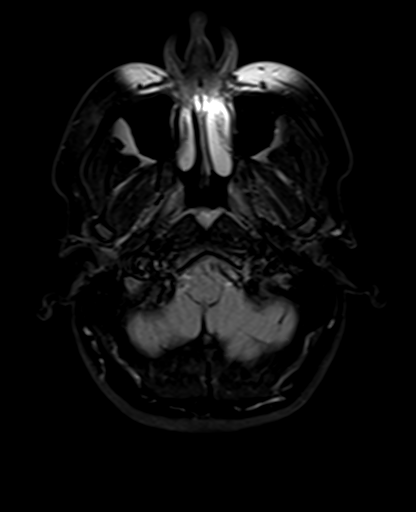
[im 40/40]
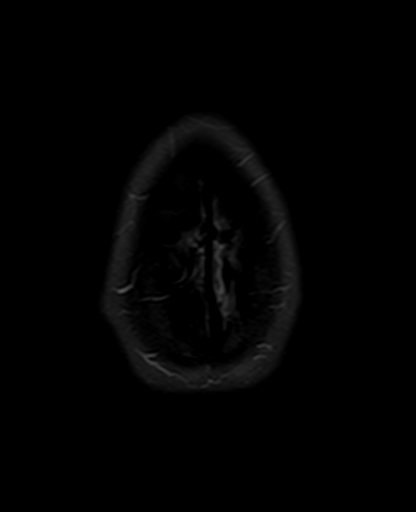

[Series 17: T2 post-contrast · coronal · 5.0mm · 0.72mm/px · 2 of 28 slices shown]
[im 1/28]
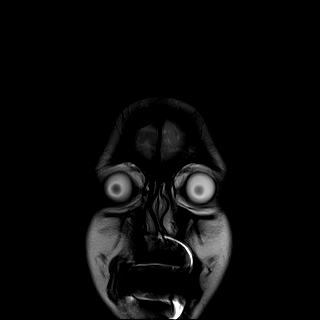
[im 28/28]
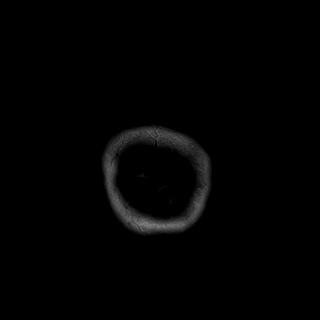

[Series 19: T1 post-contrast · coronal · 5.0mm · 0.34mm/px · 2 of 28 slices shown (1 of 2)]
[im 1/28]
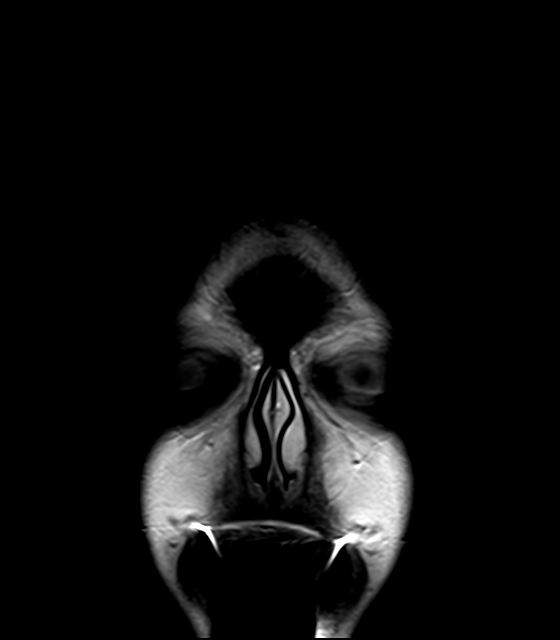
[im 28/28]
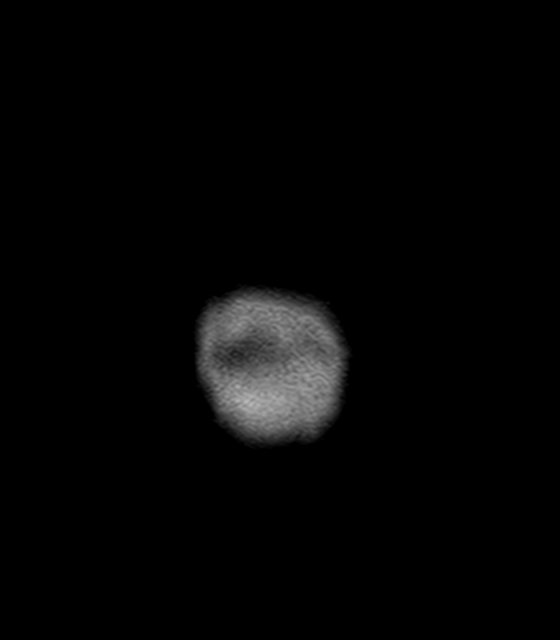

[Series 20: T1 post-contrast · sagittal · 5.0mm · 0.72mm/px · 1 of 19 slices shown (2 of 2)]
[im 1/19]
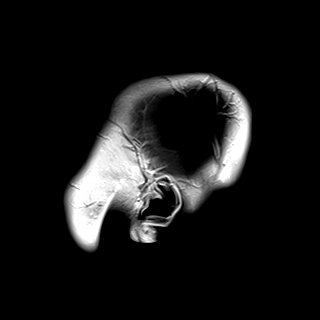

[32 of 48 positions shown; findings below may reference images not displayed]

FINDINGS: Brain: No acute infarction, hemorrhage, hydrocephalus, extra-axial
collection or mass lesion. Approximately 1 cm nonenhancing pineal
cyst without significant mass effect.

Vascular: Major arterial flow voids are maintained at the skull
base.

Skull and upper cervical spine: Normal marrow signal.

Sinuses/Orbits: Clear sinuses.  Unremarkable orbits.

Other: No mastoid effusions.
IMPRESSION: 1. No evidence of acute intracranial abnormality.
2. Approximately 1 cm nonenhancing pineal cyst without significant
mass effect.

## 2021-01-15 MED ORDER — GADOBUTROL 1 MMOL/ML IV SOLN
7.0000 mL | Freq: Once | INTRAVENOUS | Status: AC | PRN
  Administered 2021-01-15: 7 mL via INTRAVENOUS

## 2021-01-19 ENCOUNTER — Telehealth: Payer: Self-pay | Admitting: Neurology

## 2021-01-19 NOTE — Telephone Encounter (Signed)
I called the pt and advised of MRI.  Pt sts this is a new finding for her and I advised of plan to repeat in 12-24 months. Pt advised I would fwd report to Dr. Frances Furbish to review once she returns to the office.  Pt verbalized understanding and had no questions/concerns.

## 2021-01-19 NOTE — Telephone Encounter (Signed)
  IMPRESSION: 1. No evidence of acute intracranial abnormality. 2. Approximately 1 cm nonenhancing pineal cyst without significant mass effect.  Please call patient, MRI of the brain showed no acute abnormality, incidental findings of 1 cm of enhancing pineal cyst, with no evidence of mass-effect,  Above findings less likely account for her frequent headache  If this is the first time she knows about the existence of the pineal cyst, may consider repeat MRI in 12 to 24 months to establish stability

## 2021-02-08 ENCOUNTER — Ambulatory Visit
Admission: RE | Admit: 2021-02-08 | Discharge: 2021-02-08 | Disposition: A | Discharge status: Home / Self Care | Payer: Commercial Managed Care - PPO | Source: Ambulatory Visit | Attending: Family Medicine | Admitting: Family Medicine

## 2021-02-08 ENCOUNTER — Other Ambulatory Visit: Payer: Self-pay

## 2021-02-08 VITALS — BP 127/84 | HR 95 | Temp 99.4°F | Resp 17

## 2021-02-08 DIAGNOSIS — U071 COVID-19: Secondary | ICD-10-CM | POA: Diagnosis not present

## 2021-02-08 MED ORDER — NAPROXEN 375 MG PO TABS: 375.0000 mg | ORAL_TABLET | Freq: Two times a day (BID) | ORAL | 0 refills | Status: DC

## 2021-02-08 MED ORDER — LEVOCETIRIZINE DIHYDROCHLORIDE 5 MG PO TABS: 5.0000 mg | ORAL_TABLET | Freq: Every evening | ORAL | 0 refills | Status: DC

## 2021-02-08 MED ORDER — PSEUDOEPHEDRINE HCL ER 120 MG PO TB12: 120.0000 mg | ORAL_TABLET | Freq: Two times a day (BID) | ORAL | 0 refills | Status: DC | PRN

## 2021-02-08 NOTE — ED Provider Notes (Signed)
[facility]    CSN: [id number] Arrival date & time: 02/08/21  1452      History   Chief Complaint Chief Complaint  Patient presents with  . Covid Positive    HPI Annette Gould is a 20 y.o. female.   HPI  Patient presents today for repeat COVID test.  She took a home test and her COVID test was positive. She reports that she has had fever at home, severe nasal congestion, and mild cough. Denies any shortness of breath or weakness. Past Medical History:  Diagnosis Date  . Bursitis    hips  . GERD (gastroesophageal reflux disease)   . Migraine headache    approx 2/month  . PONV (postoperative nausea and vomiting)     Patient Active Problem List   Diagnosis Date Noted  . Gastroesophageal reflux disease with esophagitis without hemorrhage 10/21/2020  . Migraine aura without headache 10/21/2020  . History of cholecystectomy 09/11/2020  . Other specified anxiety disorders 09/11/2020  . Choledocholithiasis   . Gallstone pancreatitis 07/08/2020  . Transaminitis 07/08/2020  . Hypokalemia 07/08/2020  . Hyperbilirubinemia 07/08/2020  . Generalized abdominal pain 01/01/2020  . Nausea with vomiting 01/01/2020  . GERD (gastroesophageal reflux disease) 01/01/2020  . Constipation 01/01/2020    Past Surgical History:  Procedure Laterality Date  . BIOPSY N/A 04/15/2020   Procedure: BIOPSY;  Surgeon: Pasty Spillers, MD;  Location: [facility];  Service: Endoscopy;  Laterality: N/A;  . CHOLECYSTECTOMY N/A 07/10/2020   Procedure: LAPAROSCOPIC CHOLECYSTECTOMY WITH INTRAOPERATIVE CHOLANGIOGRAM;  Surgeon: [name], MD;  Location: [facility];  Service: General;  Laterality: N/A;  . DENTAL SURGERY    . ERCP N/A 07/11/2020   Procedure: ENDOSCOPIC RETROGRADE CHOLANGIOPANCREATOGRAPHY (ERCP);  Surgeon: Malissa Hippo, MD;  Location: [facility];  Service: Endoscopy;  Laterality: N/A;  . ESOPHAGOGASTRODUODENOSCOPY (EGD) WITH PROPOFOL N/A 04/15/2020   Procedure:  ESOPHAGOGASTRODUODENOSCOPY (EGD) WITH PROPOFOL;  Surgeon: Pasty Spillers, MD;  Location: [facility];  Service: Endoscopy;  Laterality: N/A;  . ESOPHAGOGASTRODUODENOSCOPY (EGD) WITH PROPOFOL N/A 12/25/2020   Procedure: ESOPHAGOGASTRODUODENOSCOPY (EGD) WITH PROPOFOL;  Surgeon: Rachael Fee, MD;  Location: [facility];  Service: Endoscopy;  Laterality: N/A;  . EUS N/A 12/25/2020   Procedure: UPPER ENDOSCOPIC ULTRASOUND (EUS) RADIAL;  Surgeon: Rachael Fee, MD;  Location: [facility];  Service: Endoscopy;  Laterality: N/A;  . SPHINCTEROTOMY N/A 07/11/2020   Procedure: SPHINCTEROTOMY PYLORICE CHANNEL DILALATION , NORMAL SIZE BILE DUCT;  Surgeon: Malissa Hippo, MD;  Location: [facility];  Service: Endoscopy;  Laterality: N/A;  . STONE EXTRACTION WITH BASKET N/A 07/11/2020   Procedure: STONE EXTRACTION WITH WITH BALLOON;  Surgeon: Malissa Hippo, MD;  Location: [facility];  Service: Endoscopy;  Laterality: N/A;  . TYMPANOSTOMY TUBE PLACEMENT      OB History    Gravida  0   Para  0   Term  0   Preterm  0   AB  0   Living  0     SAB  0   IAB  0   Ectopic  0   Multiple  0   Live Births  0            Home Medications    Prior to Admission medications   Medication Sig Start Date End Date Taking? Authorizing Provider  dicyclomine (BENTYL) 20 MG tablet Take 1 tablet (20 mg total) by mouth 2 (two) times daily. Patient not taking: Reported on 12/16/2020  09/14/20   Mannie Stabile, PA-C  norethindrone-ethinyl estradiol (LOESTRIN FE) 1-20 MG-MCG tablet Take 1 tablet by mouth daily.    [provider]  pantoprazole (PROTONIX) 40 MG tablet Take 1 tablet (40 mg total) by mouth 2 (two) times daily. 09/11/20 10/11/20  Pasty Spillers, MD  promethazine (PHENERGAN) 25 MG tablet Take 1 tablet (25 mg total) by mouth every 6 (six) hours as needed for nausea or vomiting. 10/24/20   Moshe Cipro, NP  propranolol (INDERAL) 10 MG tablet Take  10 mg by mouth daily as needed (If blood pressure rises).  11/12/19   [provider]  sertraline (ZOLOFT) 50 MG tablet Take 50 mg by mouth daily. 11/12/19   [provider]  Ubrogepant (UBRELVY) 50 MG TABS Take 50 mg by mouth as needed (may repeat once in 2 hours. no more than 2 pills in 24 h.). 12/16/20   Huston Foley, MD  famotidine (PEPCID) 20 MG tablet Take 1 tablet (20 mg total) by mouth 2 (two) times daily. 12/28/19 01/01/20  [name], PA-C    Family History Family History  Problem Relation Age of Onset  . Gastric cancer Maternal Grandfather        "not sure where, but somewhere in the stomach area"  . Colon cancer Neg Hx     Social History Social History   Tobacco Use  . Smoking status: Former Smoker    Quit date: 12/03/2019    Years since quitting: 1.1  . Smokeless tobacco: Never Used  . Tobacco comment: Smoked less than 1 pack per week for about 3 months  Vaping Use  . Vaping Use: Never used  Substance Use Topics  . Alcohol use: Not Currently    Comment: may have 1-2 drinks/month. None currently.   . Drug use: Yes    Types: Marijuana     Allergies   Peanut-containing drug products   Review of Systems Review of Systems Pertinent negatives listed in HPI  Physical Exam Triage Vital Signs ED Triage Vitals [02/08/21 1506]  Enc Vitals Group     BP 127/84     Pulse Rate 95     Resp 17     Temp 99.4 F (37.4 C)     Temp Source Oral     SpO2 97 %     Weight      Height      Head Circumference      Peak Flow      Pain Score 4     Pain Loc      Pain Edu?      Excl. in GC?    No data found.  Updated Vital Signs BP 127/84 (BP Location: Right Arm)   Pulse 95   Temp 99.4 F (37.4 C) (Oral)   Resp 17   SpO2 97%   Visual Acuity Right Eye Distance:   Left Eye Distance:   Bilateral Distance:    Right Eye Near:   Left Eye Near:    Bilateral Near:     Physical Exam  General Appearance:    Alert, cooperative, no distress  HENT:    Normocephalic, ears normal, nares mucosal edema with congestion, rhinorrhea, oropharynx clear   Eyes:    PERRL, conjunctiva/corneas clear, EOM's intact       Lungs:     Clear to auscultation bilaterally, respirations unlabored  Heart:    Regular rate and rhythm  Neurologic:   Awake, alert, oriented x 3. No apparent focal neurological  defect.      UC Treatments / Results  Labs (all labs ordered are listed, but only abnormal results are displayed) Labs Reviewed - No data to display  EKG   Radiology No results found.  Procedures Procedures (including critical care time)  Medications Ordered in UC Medications - No data to display  Initial Impression / Assessment and Plan / UC Course  I have reviewed the triage vital signs and the nursing notes.  Pertinent labs & imaging results that were available during my care of the patient were reviewed by me and considered in my medical decision making (see chart for details).     COVID-19 infection, confirmatory COVID test completed. Symptomatic manage warranted. Quarantine indication discussed.  Final Clinical Impressions(s) / UC Diagnoses   Final diagnoses:  COVID-19 virus infection     Discharge Instructions     Your COVID 19 results should result within 3 days. Negative results are immediately resulted to Mychart. Positive results will receive a follow-up call from our clinic. If symptoms are present, I recommend home quarantine until results are known.  Alternate Tylenol and ibuprofen as needed for body aches and fever.  S    ED Prescriptions    Medication Sig Dispense Auth. Provider   pseudoephedrine (SUDAFED 12 HOUR) 120 MG 12 hr tablet Take 1 tablet (120 mg total) by mouth every 12 (twelve) hours as needed for congestion. 30 tablet Bing Neighbors, FNP   naproxen (NAPROSYN) 375 MG tablet Take 1 tablet (375 mg total) by mouth 2 (two) times daily. 20 tablet Bing Neighbors, FNP   levocetirizine (XYZAL) 5 MG  tablet Take 1 tablet (5 mg total) by mouth every evening. 30 tablet Bing Neighbors, FNP     PDMP not reviewed this encounter.   Bing Neighbors, [place] 02/11/21 [phone]

## 2021-02-08 NOTE — ED Triage Notes (Signed)
Sore throat, body aches , fatigue, headache s/s started Thursday.  + at home covid test done on Friday.

## 2021-02-08 NOTE — Discharge Instructions (Addendum)
Your COVID 19 results should result within 3 days. Negative results are immediately resulted to Mychart. Positive results will receive a follow-up call from our clinic. If symptoms are present, I recommend home quarantine until results are known.  Alternate Tylenol and ibuprofen as needed for body aches and fever.  S

## 2021-02-09 LAB — COVID-19, FLU A+B NAA
Influenza A, NAA: NOT DETECTED
Influenza B, NAA: NOT DETECTED
SARS-CoV-2, NAA: DETECTED — AB

## 2021-03-19 ENCOUNTER — Ambulatory Visit (INDEPENDENT_AMBULATORY_CARE_PROVIDER_SITE_OTHER): Payer: Commercial Managed Care - PPO | Admitting: Neurology

## 2021-03-19 ENCOUNTER — Other Ambulatory Visit: Payer: Self-pay

## 2021-03-19 ENCOUNTER — Encounter: Payer: Self-pay | Admitting: Neurology

## 2021-03-19 VITALS — BP 130/82 | HR 81 | Ht 62.0 in | Wt 146.0 lb

## 2021-03-19 DIAGNOSIS — G43101 Migraine with aura, not intractable, with status migrainosus: Secondary | ICD-10-CM

## 2021-03-19 DIAGNOSIS — R202 Paresthesia of skin: Secondary | ICD-10-CM | POA: Diagnosis not present

## 2021-03-19 MED ORDER — UBRELVY 100 MG PO TABS: 100.0000 mg | ORAL_TABLET | ORAL | 3 refills | Status: DC | PRN

## 2021-03-19 MED ORDER — PROPRANOLOL HCL 20 MG PO TABS: 20.0000 mg | ORAL_TABLET | Freq: Every day | ORAL | 5 refills | Status: DC

## 2021-03-19 NOTE — Patient Instructions (Addendum)
It was nice to see you again today.  I am glad to hear that Annette Gould has helped a little bit.  As discussed, I would like to increase the dose to the 100 mg strength as needed.  We will consider a prescription daily preventative called Qulipta in the future.  For now, since you have a prescription for propranolol and have tried it before I would like for you to take propranolol daily, 20 mg strength.  Please be aware that it can cause side effects including: lethargy, sedation, low blood pressure and low pulse rate.   We use a beta-blocker often for first-line preventative treatment for migraines.    Please try to get enough sleep, 7 to 8 hours are recommended for the average adult.  Please follow-up routinely to see one of our nurse practitioners in 6 months, sooner if needed.  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  The repeat MRI would serve as surveillance to make sure the cyst looks stable.

## 2021-03-19 NOTE — Progress Notes (Signed)
Subjective:    Patient ID: Annette Gould is a 20 y.o. female.  HPI    Interim history:   Ms. Pennisi is a 20 year old right-handed woman with an underlying medical history of reflux disease, anxiety, depression, history of bursitis, and recent status post cholecystectomy, who presents for follow-up consultation of her migraine headaches.  He is unaccompanied today.  I first met her at the request of her primary care provider on 12/16/2020, at which time she reported a longstanding history of migraine headaches since preteen years.  She has had intermittent tingling.  She had a nonfocal exam.  Migraine frequency did not necessarily warrant a preventative.  But she had prolonged migraines at times.  She was advised to start Ubrelvy as needed to 50 mg strength.  We talked about utilizing Nurtec or Qulipta as a preventative.  She was advised to get an eye exam done as she had not seen her eye doctor in some years.  She was also encouraged to talk to her GYN about switching her contraceptive pill to one with low estrogen or no estrogen.  I suggested we proceed with a brain MRI.  She had a brain MRI with and without contrast on 01/15/2021 and I reviewed the results:  IMPRESSION: 1. No evidence of acute intracranial abnormality. 2. Approximately 1 cm nonenhancing pineal cyst without significant mass effect.  She was called with the test results.  Today, 03/19/2021: She reports that the [name] has helped.  She had to take a second dose at times.  It helps better when she takes it right away.  She has had approximately 2 migraine attacks per month but they can last up to 5 days.  She has had a updated eye exam and has new eyeglasses.  She stopped taking her contraceptive pill but it did not make a difference so she is back on it.  She stopped her sertraline recently on her own, it was 50 mg strength, she is advised not to stop any antidepressant or anxiety medication cold [place] especially without the  advice of her provider.  She reports that she did not feel well on it and had no repercussions after stopping it.  She has a prescription for propranolol 10 mg as needed for anxiety.  She took 1 pill recently.  She tolerates it.  She hydrates well with water.  She is not always good with her sleep habits or sleep schedule.  She works full-time at [employer].  Previously:   12/16/20: (She) reports a longstanding history of migraines since her preteen years.  She has a strong family history on her mom's side of migraines including mom, several aunts, and great aunts.  In December, she had a prolonged migraine that lasted about 2 weeks, she had associated tingling, she started having a mild headache while at work, she works in a coffee shop.  She noticed that she had numbness and tingling in both hands and it expanded to involve both arms, both legs, including her face.  She did not seek immediate medical attention at the time.  She did have a severe migraine at the time including nausea and vomiting.  When she has a migraine she typically also has photophobia and sonophobia.  Migraines tend to be behind her eyes and extend upwards to the center of her head and sometimes go backwards to the neck or base of the head.  She has no residual tingling or permanent numbness.  She has not had any weakness or slurring  of speech or sudden onset of facial droop.  She does have occasional visual auras with her migraines including flickering lines, floaters, even tunnel vision.  She has prescription eyeglasses and reports that she has not had a formal eye examination in over 3 years.  She tries to hydrate well.  She tries to get 8 hours of sleep.  Triggers may include stress, occasional dehydration, and overheating.  She currently reports a migraine frequency of once every 2 weeks but they can last up to 3 days.  She does not have much in the way of tingling, certainly not as consistent as in December 2021.    I reviewed your  telemedicine note from 10/10/2020.  He reported increase in her headaches.  She reported intermittent tingling in her face, arms and legs.  She has been on sertraline, 50 mg strength 1-1/2 pills daily.  She has been on oral contraceptive pills.  She is taking pantoprazole for reflux.  She has been on propranolol 10 mg strength once daily. She drinks caffeine in the form of coffee, 2 cups/day, typically no later than lunchtime.  She drinks alcohol rarely to none.  She does smoke marijuana occasionally, does not smoke cigarettes.  She lives with her parents.   Her Past Medical History Is Significant For: Past Medical History:  Diagnosis Date   Bursitis    hips   GERD (gastroesophageal reflux disease)    Migraine headache    approx 2/month   PONV (postoperative nausea and vomiting)     Her Past Surgical History Is Significant For: Past Surgical History:  Procedure Laterality Date   BIOPSY N/A 04/15/2020   Procedure: BIOPSY;  Surgeon: Virgel Manifold, MD;  Location: [facility];  Service: Endoscopy;  Laterality: N/A;   CHOLECYSTECTOMY N/A 07/10/2020   Procedure: LAPAROSCOPIC CHOLECYSTECTOMY WITH INTRAOPERATIVE CHOLANGIOGRAM;  Surgeon: Aviva Signs, MD;  Location: [facility];  Service: General;  Laterality: N/A;   DENTAL SURGERY     ERCP N/A 07/11/2020   Procedure: ENDOSCOPIC RETROGRADE CHOLANGIOPANCREATOGRAPHY (ERCP);  Surgeon: Rogene Houston, MD;  [facility];  Service: Endoscopy;  Laterality: N/A;   ESOPHAGOGASTRODUODENOSCOPY (EGD) WITH PROPOFOL N/A 04/15/2020   Procedure: ESOPHAGOGASTRODUODENOSCOPY (EGD) WITH PROPOFOL;  Surgeon: Virgel Manifold, MD;  Location: [facility];  Service: Endoscopy;  Laterality: N/A;   ESOPHAGOGASTRODUODENOSCOPY (EGD) WITH PROPOFOL N/A 12/25/2020   Procedure: ESOPHAGOGASTRODUODENOSCOPY (EGD) WITH PROPOFOL;  Surgeon: Milus Banister, MD;  Location: [facility];  Service: Endoscopy;  Laterality: N/A;   EUS N/A 12/25/2020    Procedure: UPPER ENDOSCOPIC ULTRASOUND (EUS) RADIAL;  Surgeon: Milus Banister, MD;  Location: [facility];  Service: Endoscopy;  Laterality: N/A;   SPHINCTEROTOMY N/A 07/11/2020   Procedure: SPHINCTEROTOMY PYLORICE CHANNEL DILALATION 15MM, NORMAL SIZE BILE DUCT;  Surgeon: Rogene Houston, MD;  [facility];  Service: Endoscopy;  Laterality: N/A;   STONE EXTRACTION WITH BASKET N/A 07/11/2020   Procedure: STONE EXTRACTION WITH WITH BALLOON;  Surgeon: Rogene Houston, MD;  [facility];  Service: Endoscopy;  Laterality: N/A;   TYMPANOSTOMY TUBE PLACEMENT      Her Family History Is Significant For: Family History  Problem Relation Age of Onset   Gastric cancer Maternal Grandfather        "not sure where, but somewhere in the stomach area"   Colon cancer Neg Hx     Her Social History Is Significant For: Social History   Socioeconomic History   Marital status: Single  Spouse name: Not on file   Number of children: Not on file   Years of education: Not on file   Highest education level: Not on file  Occupational History   Not on file  Tobacco Use   Smoking status: Former    Pack years: 0.00    Types: Cigarettes    Quit date: 12/03/2019    Years since quitting: 1.2   Smokeless tobacco: Never   Tobacco comments:    Smoked less than 1 pack per week for about 3 months  Vaping Use   Vaping Use: Never used  Substance and Sexual Activity   Alcohol use: Not Currently    Comment: may have 1-2 drinks/month. None currently.    Drug use: Yes    Types: Marijuana   Sexual activity: Yes  Other Topics Concern   Not on file  Social History Narrative   Not on file   Social Determinants of Health   Financial Resource Strain: Not on file  Food Insecurity: Not on file  Transportation Needs: Not on file  Physical Activity: Not on file  Stress: Not on file  Social Connections: Not on file    Her Allergies Are:  Allergies  Allergen Reactions   Peanut-Containing  Drug Products Hives and Swelling    Peanuts  :   Her Current Medications Are:  Outpatient Encounter Medications as of 03/19/2021  Medication Sig   promethazine (PHENERGAN) 25 MG tablet Take 1 tablet (25 mg total) by mouth every 6 (six) hours as needed for nausea or vomiting.   propranolol (INDERAL) 10 MG tablet Take 10 mg by mouth daily as needed (If blood pressure rises).    Ubrogepant (UBRELVY) 50 MG TABS Take 50 mg by mouth as needed (may repeat once in 2 hours. no more than 2 pills in 24 h.).   [DISCONTINUED] dicyclomine (BENTYL) 20 MG tablet Take 1 tablet (20 mg total) by mouth 2 (two) times daily. (Patient not taking: Reported on 12/16/2020)   [DISCONTINUED] famotidine (PEPCID) 20 MG tablet Take 1 tablet (20 mg total) by mouth 2 (two) times daily.   [DISCONTINUED] levocetirizine (XYZAL) 5 MG tablet Take 1 tablet (5 mg total) by mouth every evening.   [DISCONTINUED] naproxen (NAPROSYN) 375 MG tablet Take 1 tablet (375 mg total) by mouth 2 (two) times daily.   [DISCONTINUED] norethindrone-ethinyl estradiol (LOESTRIN FE) 1-20 MG-MCG tablet Take 1 tablet by mouth daily.   [DISCONTINUED] pantoprazole (PROTONIX) 40 MG tablet Take 1 tablet (40 mg total) by mouth 2 (two) times daily.   [DISCONTINUED] pseudoephedrine (SUDAFED 12 HOUR) 120 MG 12 hr tablet Take 1 tablet (120 mg total) by mouth every 12 (twelve) hours as needed for congestion.   [DISCONTINUED] sertraline (ZOLOFT) 50 MG tablet Take 50 mg by mouth daily.   No facility-administered encounter medications on file as of 03/19/2021.  :  Review of Systems:  Out of a complete 14 point review of systems, all are reviewed and negative with the exception of these symptoms as listed below:  Review of Systems  Neurological:        Here for 3 month f/u on migraines. Reports sx are the same. Reports 2 migraines that lasted 5 days each over the last 30 days.   Objective:  Neurological Exam  Physical Exam Physical Examination:   Vitals:    03/19/21 0837  BP: 130/82  Pulse: 81    General Examination: The patient is a very pleasant 20 y.o. female in no acute distress. She appears  well-developed and well-nourished and well groomed.   HEENT: Normocephalic, atraumatic, pupils are equal, round and reactive to light, corrective eyeglasses in place, extraocular tracking is well-preserved, face is symmetric with normal facial animation, airway exam reveals stable findings.    Chest: Clear to auscultation without wheezing, rhonchi or crackles noted.   Heart: S1+S2+0, regular and normal without murmurs, rubs or gallops noted.   Abdomen: Soft, non-tender and non-distended.   Extremities: There is no pitting edema in the distal lower extremities bilaterally.   Skin: Warm and dry without trophic changes noted.   Musculoskeletal: exam reveals no obvious joint deformities.   Neurologically: Mental status: The patient is awake, alert and oriented in all 4 spheres. Her immediate and remote memory, attention, language skills and fund of knowledge are appropriate. There is no evidence of aphasia, agnosia, apraxia or anomia. Speech is clear with normal prosody and enunciation. Thought process is linear. Mood is normal and affect is normal. Cranial nerves II - XII are as described above under HEENT exam.  Motor exam: Normal bulk, strength and tone is noted. There is no drift or tremor. Romberg is negative. Fine motor skills and coordination: Grossly intact throughout.  Sensory exam is intact to light touch throuout.  Cerebellar testing: No dysmetria or intention tremor. There is no truncal or gait ataxia. Gait, station and balance: She stands easily. No veering to one side is noted. No leaning to one side is noted. Posture is age-appropriate and stance is narrow based. Gait shows normal stride length and normal pace. No problems turning are noted. Tandem walk is unremarkable.   Assessment and Plan:    In summary, SAHALIE BETH is a very  pleasant 20 year old female with an underlying medical history of reflux disease, anxiety, depression, history of bursitis, and recent status post cholecystectomy, who presents for follow-up consultation of her migraine headaches.  She has had modest results with Ubrelvy 50 mg strength as needed.  She stopped her antidepressant medication recently.  She is advised to notify her primary care about this.  She has received new eyeglasses.  She needs to pick up her contact lenses she says.  She is advised to repeat her brain MRI down the road for surveillance of her benign pineal gland cyst.  This was likely an incidental finding which was seen on her brain MRI with and without contrast from April 2022.  Exam is nonfocal.  We talked about migraine prevention at this time, we mutually agreed to increase her propranolol.  She has a prescription from her primary care for as needed use of 10 mg strength propranolol and I suggested we increase it to 20 mg once daily consistently for migraine prevention.  She is agreeable to this.  Alternatively, we can consider Qulipta in the near future.  Furthermore, I would like to increase her Ubrelvy to 100 mg as needed.  She is agreeable.  I suggested a follow-up in this clinic to see one of our nurse practitioners in 6 months, sooner if needed.  She was given written instructions as well today.  I answered all her questions today and she was in agreement with the plan. I spent 32 minutes in total face-to-face time and in reviewing records during pre-charting, more than 50% of which was spent in counseling and coordination of care, reviewing test results, reviewing medications and treatment regimen and/or in discussing or reviewing the diagnosis of Migraines, the prognosis and treatment options. Pertinent laboratory and imaging test results that were available  during this visit with the patient were reviewed by me and considered in my medical decision making (see chart for details).

## 2021-06-13 ENCOUNTER — Encounter: Payer: Self-pay | Admitting: Emergency Medicine

## 2021-06-13 ENCOUNTER — Ambulatory Visit
Admission: EM | Admit: 2021-06-13 | Discharge: 2021-06-13 | Disposition: A | Discharge status: Home / Self Care | Payer: Commercial Managed Care - PPO | Attending: Emergency Medicine | Admitting: Emergency Medicine

## 2021-06-13 DIAGNOSIS — J069 Acute upper respiratory infection, unspecified: Secondary | ICD-10-CM

## 2021-06-13 MED ORDER — BENZONATATE 100 MG PO CAPS: 100.0000 mg | ORAL_CAPSULE | Freq: Three times a day (TID) | ORAL | 0 refills | Status: DC

## 2021-06-13 MED ORDER — CETIRIZINE-PSEUDOEPHEDRINE ER 5-120 MG PO TB12: 1.0000 | ORAL_TABLET | Freq: Every day | ORAL | 0 refills | Status: DC

## 2021-06-13 NOTE — ED Triage Notes (Signed)
Sinus and chest congestion x 4 days

## 2021-06-13 NOTE — Discharge Instructions (Signed)
Get plenty of rest and push fluids Zyrtec d prescribed Tessalon Perles prescribed for cough Use OTC zyrtec for nasal congestion, runny nose, and/or sore throat Use OTC flonase for nasal congestion and runny nose Use medications daily for symptom relief Use OTC medications like ibuprofen or tylenol as needed fever or pain Call or go to the ED if you have any new or worsening symptoms such as fever, worsening cough, shortness of breath, chest tightness, chest pain, turning blue, changes in mental status, etc..Marland Kitchen

## 2021-06-13 NOTE — ED Provider Notes (Signed)
[facility]   [id number] 06/13/21 Arrival Time: 1456   CC: COVID symptoms  SUBJECTIVE: History from: patient.  Annette Gould is a 20 y.o. female who presents with sinus and chest congestion x 4 days.  Denies sick exposure to COVID, flu or strep.  Denies aggravating or alleviating factors.  Reports previous symptoms in the past with covid infection.   Denies fever, chills, SOB, wheezing, chest pain, nausea, changes in bowel or bladder habits.     ROS: As per HPI.  All other pertinent ROS negative.     Past Medical History:  Diagnosis Date   Bursitis    hips   GERD (gastroesophageal reflux disease)    Migraine headache    approx 2/month   PONV (postoperative nausea and vomiting)    Past Surgical History:  Procedure Laterality Date   BIOPSY N/A 04/15/2020   Procedure: BIOPSY;  Surgeon: Pasty Spillers, MD;  Location: [facility];  Service: Endoscopy;  Laterality: N/A;   CHOLECYSTECTOMY N/A 07/10/2020   Procedure: LAPAROSCOPIC CHOLECYSTECTOMY WITH INTRAOPERATIVE CHOLANGIOGRAM;  Surgeon: [name], MD;  Location: [facility];  Service: General;  Laterality: N/A;   DENTAL SURGERY     ERCP N/A 07/11/2020   Procedure: ENDOSCOPIC RETROGRADE CHOLANGIOPANCREATOGRAPHY (ERCP);  Surgeon: Malissa Hippo, MD;  [facility];  Service: Endoscopy;  Laterality: N/A;   ESOPHAGOGASTRODUODENOSCOPY (EGD) WITH PROPOFOL N/A 04/15/2020   Procedure: ESOPHAGOGASTRODUODENOSCOPY (EGD) WITH PROPOFOL;  Surgeon: Pasty Spillers, MD;  Location: [facility];  Service: Endoscopy;  Laterality: N/A;   ESOPHAGOGASTRODUODENOSCOPY (EGD) WITH PROPOFOL N/A 12/25/2020   Procedure: ESOPHAGOGASTRODUODENOSCOPY (EGD) WITH PROPOFOL;  Surgeon: Rachael Fee, MD;  [facility];  Service: Endoscopy;  Laterality: N/A;   EUS N/A 12/25/2020   Procedure: UPPER ENDOSCOPIC ULTRASOUND (EUS) RADIAL;  Surgeon: Rachael Fee, MD;  Location[facility];  Service: Endoscopy;   Laterality: N/A;   SPHINCTEROTOMY N/A 07/11/2020   Procedure: SPHINCTEROTOMY PYLORICE CHANNEL DILALATION , NORMAL SIZE BILE DUCT;  Surgeon: Malissa Hippo, MD;  [facility];  Service: Endoscopy;  Laterality: N/A;   STONE EXTRACTION WITH BASKET N/A 07/11/2020   Procedure: STONE EXTRACTION WITH WITH BALLOON;  Surgeon: Malissa Hippo, MD;  [facility];  Service: Endoscopy;  Laterality: N/A;   TYMPANOSTOMY TUBE PLACEMENT     Allergies  Allergen Reactions   Peanut-Containing Drug Products Hives and Swelling    Peanuts   No current facility-administered medications on file prior to encounter.   Current Outpatient Medications on File Prior to Encounter  Medication Sig Dispense Refill   promethazine (PHENERGAN) 25 MG tablet Take 1 tablet (25 mg total) by mouth every 6 (six) hours as needed for nausea or vomiting. 30 tablet 0   propranolol (INDERAL) 20 MG tablet Take 1 tablet (20 mg total) by mouth daily. 30 tablet 5   Ubrogepant (UBRELVY) 100 MG TABS Take 100 mg by mouth as needed. May repeat in 2 hours if needed, no more than 2 pills in 24 hours. 10 tablet 3   [DISCONTINUED] famotidine (PEPCID) 20 MG tablet Take 1 tablet (20 mg total) by mouth 2 (two) times daily. 30 tablet 0   Social History   Socioeconomic History   Marital status: Single    Spouse name: Not on file   Number of children: Not on file   Years of education: Not on file   Highest education level: Not on file  Occupational History   Not on file  Tobacco Use   Smoking status: Former    Types: Cigarettes    Quit date: 12/03/2019    Years since quitting: 1.5   Smokeless tobacco: Never   Tobacco comments:    Smoked less than 1 pack per week for about 3 months  Vaping Use   Vaping Use: Never used  Substance and Sexual Activity   Alcohol use: Not Currently    Comment: may have 1-2 drinks/month. None currently.    Drug use: Yes    Types: Marijuana   Sexual activity: Yes  Other Topics Concern    Not on file  Social History Narrative   Not on file   Social Determinants of Health   Financial Resource Strain: Not on file  Food Insecurity: Not on file  Transportation Needs: Not on file  Physical Activity: Not on file  Stress: Not on file  Social Connections: Not on file  Intimate Partner Violence: Not on file   Family History  Problem Relation Age of Onset   Gastric cancer Maternal Grandfather        "not sure where, but somewhere in the stomach area"   Colon cancer Neg Hx     OBJECTIVE:  Vitals:   06/13/21 1506  BP: (!) 142/86  Pulse: 85  Resp: 18  Temp: 99 F (37.2 C)  TempSrc: Oral  SpO2: 98%     General appearance: alert; appears fatigued, but nontoxic; speaking in full sentences and tolerating own secretions HEENT: NCAT; Ears: EACs clear, TMs pearly gray; Eyes: PERRL.  EOM grossly intact. Nose: nares patent mild clear rhinorrhea, Throat: oropharynx clear, tonsils non erythematous or enlarged, uvula midline  Neck: supple without LAD Lungs: unlabored respirations, symmetrical air entry; cough: absent; no respiratory distress; CTAB Heart: regular rate and rhythm.   Skin: warm and dry Psychological: alert and cooperative; normal mood and affect   ASSESSMENT & PLAN:  1. Viral URI with cough     Meds ordered this encounter  Medications   cetirizine-pseudoephedrine (ZYRTEC-D) 5-120 MG tablet    Sig: Take 1 tablet by mouth daily.    Dispense:  30 tablet    Refill:  0    Order Specific Question:   Supervising Provider    Answer:   Eustace Moore [[id number]]   benzonatate (TESSALON) 100 MG capsule    Sig: Take 1 capsule (100 mg total) by mouth every 8 (eight) hours.    Dispense:  21 capsule    Refill:  0    Order Specific Question:   Supervising Provider    Answer:   Eustace Moore [[id number]]    Get plenty of rest and push fluids Zyrtec d prescribed Tessalon Perles prescribed for cough Use OTC zyrtec for nasal congestion, runny nose, and/or  sore throat Use OTC flonase for nasal congestion and runny nose Use medications daily for symptom relief Use OTC medications like ibuprofen or tylenol as needed fever or pain Call or go to the ED if you have any new or worsening symptoms such as fever, worsening cough, shortness of breath, chest tightness, chest pain, turning blue, changes in mental status, etc...   Reviewed expectations re: course of current medical issues. Questions answered. Outlined signs and symptoms indicating need for more acute intervention. Patient verbalized understanding. After Visit Summary given.          Rennis Harding, PA-C 06/13/21 1511

## 2021-09-14 NOTE — Patient Instructions (Incomplete)

## 2021-09-14 NOTE — Progress Notes (Deleted)
No chief complaint on file.    HISTORY OF PRESENT ILLNESS:  09/14/21 ALL:  Annette Gould is a 20 y.o. female here today for follow up for migraines. She was started on propranolol 21m daily at last visit with Dr Annette Alberts6/2022. URoselyn Meierwas continued.    HISTORY (copied from Dr Annette Sabinprevious note)  Annette Gould a 20year old right-handed woman with an underlying medical history of reflux disease, anxiety, depression, history of bursitis, and recent status post cholecystectomy, who presents for follow-up consultation of her migraine headaches.  He is unaccompanied today.  I first met her at the request of her primary care provider on 12/16/2020, at which time she reported a longstanding history of migraine headaches since preteen years.  She has had intermittent tingling.  She had a nonfocal exam.  Migraine frequency did not necessarily warrant a preventative.  But she had prolonged migraines at times.  She was advised to start Ubrelvy as needed to 50 mg strength.  We talked about utilizing Nurtec or Qulipta as a preventative.  She was advised to get an eye exam done as she had not seen her eye doctor in some years.  She was also encouraged to talk to her GYN about switching her contraceptive pill to one with low estrogen or no estrogen.  I suggested we proceed with a brain MRI.   She had a brain MRI with and without contrast on 01/15/2021 and I reviewed the results:  IMPRESSION: 1. No evidence of acute intracranial abnormality. 2. Approximately 1 cm nonenhancing pineal cyst without significant mass effect.   She was called with the test results.   Today, 03/19/2021: She reports that the URoselyn Meierhas helped.  She had to take a second dose at times.  It helps better when she takes it right away.  She has had approximately 2 migraine attacks per month but they can last up to 5 days.  She has had a updated eye exam and has new eyeglasses.  She stopped taking her contraceptive pill but it did  not make a difference so she is back on it.  She stopped her sertraline recently on her own, it was 50 mg strength, she is advised not to stop any antidepressant or anxiety medication [place] without the advice of her provider.  She reports that she did not feel well on it and had no repercussions after stopping it.  She has a prescription for propranolol 10 mg as needed for anxiety.  She took 1 pill recently.  She tolerates it.  She hydrates well with water.  She is not always good with her sleep habits or sleep schedule.  She works full-time at [employer]   REVIEW OF SYSTEMS: Out of a complete 14 system review of symptoms, the patient complains only of the following symptoms, and all other reviewed systems are negative.   ALLERGIES: Allergies  Allergen Reactions   Peanut-Containing Drug Products Hives and Swelling    Peanuts     HOME MEDICATIONS: Outpatient Medications Prior to Visit  Medication Sig Dispense Refill   benzonatate (TESSALON) 100 MG capsule Take 1 capsule (100 mg total) by mouth every 8 (eight) hours. 21 capsule 0   cetirizine-pseudoephedrine (ZYRTEC-D) 5-120 MG tablet Take 1 tablet by mouth daily. 30 tablet 0   promethazine (PHENERGAN) 25 MG tablet Take 1 tablet (25 mg total) by mouth every 6 (six) hours as needed for nausea or vomiting. 30 tablet 0   propranolol (INDERAL) 20 MG  tablet Take 1 tablet (20 mg total) by mouth daily. 30 tablet 5   Ubrogepant (UBRELVY) 100 MG TABS Take 100 mg by mouth as needed. May repeat in 2 hours if needed, no more than 2 pills in 24 hours. 10 tablet 3   No facility-administered medications prior to visit.     PAST MEDICAL HISTORY: Past Medical History:  Diagnosis Date   Bursitis    hips   GERD (gastroesophageal reflux disease)    Migraine headache    approx 2/month   PONV (postoperative nausea and vomiting)      PAST SURGICAL HISTORY: Past Surgical History:  Procedure Laterality Date   BIOPSY N/A 04/15/2020    Procedure: BIOPSY;  Surgeon: Virgel Manifold, MD;  Location: [place];  Service: Endoscopy;  Laterality: N/A;   CHOLECYSTECTOMY N/A 07/10/2020   Procedure: LAPAROSCOPIC CHOLECYSTECTOMY WITH INTRAOPERATIVE CHOLANGIOGRAM;  Surgeon: Aviva Signs, MD;  Location[facility];  Service: General;  Laterality: N/A;   DENTAL SURGERY     ERCP N/A 07/11/2020   Procedure: ENDOSCOPIC RETROGRADE CHOLANGIOPANCREATOGRAPHY (ERCP);  Surgeon: Rogene Houston, MD;  [facility];  Service: Endoscopy;  Laterality: N/A;   ESOPHAGOGASTRODUODENOSCOPY (EGD) WITH PROPOFOL N/A 04/15/2020   Procedure: ESOPHAGOGASTRODUODENOSCOPY (EGD) WITH PROPOFOL;  Surgeon: Virgel Manifold, MD;  Location: [place];  Service: Endoscopy;  Laterality: N/A;   ESOPHAGOGASTRODUODENOSCOPY (EGD) WITH PROPOFOL N/A 12/25/2020   Procedure: ESOPHAGOGASTRODUODENOSCOPY (EGD) WITH PROPOFOL;  Surgeon: Milus Banister, MD;  [facility];  Service: Endoscopy;  Laterality: N/A;   EUS N/A 12/25/2020   Procedure: UPPER ENDOSCOPIC ULTRASOUND (EUS) RADIAL;  Surgeon: Milus Banister, MD;  [facility];  Service: Endoscopy;  Laterality: N/A;   SPHINCTEROTOMY N/A 07/11/2020   Procedure: SPHINCTEROTOMY PYLORICE CHANNEL DILALATION 15MM, NORMAL SIZE BILE DUCT;  Surgeon: Rogene Houston, MD;  [facility];  Service: Endoscopy;  Laterality: N/A;   STONE EXTRACTION WITH BASKET N/A 07/11/2020   Procedure: STONE EXTRACTION WITH WITH BALLOON;  Surgeon: Rogene Houston, MD;  [facility];  Service: Endoscopy;  Laterality: N/A;   TYMPANOSTOMY TUBE PLACEMENT       FAMILY HISTORY: Family History  Problem Relation Age of Onset   Gastric cancer Maternal Grandfather        "not sure where, but somewhere in the stomach area"   Colon cancer Neg Hx      SOCIAL HISTORY: Social History   Socioeconomic History   Marital status: Single    Spouse name: Not on file   Number of children: Not on file    Years of education: Not on file   Highest education level: Not on file  Occupational History   Not on file  Tobacco Use   Smoking status: Former    Types: Cigarettes    Quit date: 12/03/2019    Years since quitting: 1.7   Smokeless tobacco: Never   Tobacco comments:    Smoked less than 1 pack per week for about 3 months  Vaping Use   Vaping Use: Never used  Substance and Sexual Activity   Alcohol use: Not Currently    Comment: may have 1-2 drinks/month. None currently.    Drug use: Yes    Types: Marijuana   Sexual activity: Yes  Other Topics Concern   Not on file  Social History Narrative   Not on file   Social Determinants of Health   Financial Resource Strain: Not on file  Food Insecurity: Not on  file  Transportation Needs: Not on file  Physical Activity: Not on file  Stress: Not on file  Social Connections: Not on file  Intimate Partner Violence: Not on file     PHYSICAL EXAM  There were no vitals filed for this visit. There is no height or weight on file to calculate BMI.  Generalized: Well developed, in no acute distress  Cardiology: normal rate and rhythm, no murmur auscultated  Respiratory: clear to auscultation bilaterally    Neurological examination  Mentation: Alert oriented to time, place, history taking. Follows all commands speech and language fluent Cranial nerve II-XII: Pupils were equal round reactive to light. Extraocular movements were full, visual field were full on confrontational test. Facial sensation and strength were normal. Uvula tongue midline. Head turning and shoulder shrug  were normal and symmetric. Motor: The motor testing reveals 5 over 5 strength of all 4 extremities. Good symmetric motor tone is noted throughout.  Sensory: Sensory testing is intact to soft touch on all 4 extremities. No evidence of extinction is noted.  Coordination: Cerebellar testing reveals good finger-nose-finger and heel-to-shin bilaterally.  Gait and  station: Gait is normal. Tandem gait is normal. Romberg is negative. No drift is seen.  Reflexes: Deep tendon reflexes are symmetric and normal bilaterally.    DIAGNOSTIC DATA (LABS, IMAGING, TESTING) - I reviewed patient records, labs, notes, testing and imaging myself where available.  Lab Results  Component Value Date   WBC 12.0 (H) 10/24/2020   HGB 14.4 10/24/2020   HCT 41.6 10/24/2020   MCV 78 (L) 10/24/2020   PLT 292 10/24/2020      Component Value Date/Time   NA 141 12/16/2020 0858   K 4.7 12/16/2020 0858   CL 105 12/16/2020 0858   CO2 19 (L) 12/16/2020 0858   GLUCOSE 86 12/16/2020 0858   GLUCOSE 100 (H) 09/14/2020 0950   BUN 9 12/16/2020 0858   CREATININE 0.66 12/16/2020 0858   CALCIUM 9.5 12/16/2020 0858   PROT 7.2 12/16/2020 0858   ALBUMIN 4.7 12/16/2020 0858   AST 15 12/16/2020 0858   ALT 9 12/16/2020 0858   ALKPHOS 65 12/16/2020 0858   BILITOT 0.3 12/16/2020 0858   GFRNONAA 61 10/24/2020 1009   GFRNONAA >60 09/14/2020 0950   GFRAA 70 10/24/2020 1009   Lab Results  Component Value Date   CHOL 176 07/08/2020   HDL 80 07/08/2020   LDLCALC 83 07/08/2020   TRIG 63 07/08/2020   CHOLHDL 2.2 07/08/2020   No results found for: HGBA1C No results found for: VITAMINB12 No results found for: TSH  No flowsheet data found.   No flowsheet data found.   ASSESSMENT AND PLAN  20 y.o. year old female  has a past medical history of Bursitis, GERD (gastroesophageal reflux disease), Migraine headache, and PONV (postoperative nausea and vomiting). here with    Migraine with aura and with status migrainosus, not intractable   No orders of the defined types were placed in this encounter.    No orders of the defined types were placed in this encounter.     Debbora Presto, MSN, FNP-C 09/14/2021, 4:21 PM  [facility] [address], [place], [place] [address] [phone]

## 2021-09-16 ENCOUNTER — Ambulatory Visit: Payer: Commercial Managed Care - PPO | Admitting: Family Medicine

## 2021-09-16 ENCOUNTER — Telehealth: Payer: Self-pay | Admitting: Family Medicine

## 2021-09-16 DIAGNOSIS — G43101 Migraine with aura, not intractable, with status migrainosus: Secondary | ICD-10-CM

## 2021-09-16 NOTE — Telephone Encounter (Signed)
LVM and sent mychart msg informing pt of NP out sick.

## 2021-09-24 NOTE — Patient Instructions (Incomplete)

## 2021-09-24 NOTE — Progress Notes (Deleted)
No chief complaint on file.     HISTORY OF PRESENT ILLNESS:  09/24/21 ALL:  Annette Gould is a 20 y.o. female here today for follow up for migraines. She was started on propranolol 64m daily at last visit with [name]6/2022. Annette Meierwas continued.    HISTORY (copied from Dr AGuadelupe Sabinprevious note)  [name] a 20year old right-handed woman with an underlying medical history of reflux disease, anxiety, depression, history of bursitis, and recent status post cholecystectomy, who presents for follow-up consultation of her migraine headaches.  He is unaccompanied today.  I first met her at the request of her primary care provider on 12/16/2020, at which time she reported a longstanding history of migraine headaches since preteen years.  She has had intermittent tingling.  She had a nonfocal exam.  Migraine frequency did not necessarily warrant a preventative.  But she had prolonged migraines at times.  She was advised to start Ubrelvy as needed to 50 mg strength.  We talked about utilizing Nurtec or Qulipta as a preventative.  She was advised to get an eye exam done as she had not seen her eye doctor in some years.  She was also encouraged to talk to her GYN about switching her contraceptive pill to one with low estrogen or no estrogen.  I suggested we proceed with a brain MRI.   She had a brain MRI with and without contrast on 01/15/2021 and I reviewed the results:  IMPRESSION: 1. No evidence of acute intracranial abnormality. 2. Approximately 1 cm nonenhancing pineal cyst without significant mass effect.   She was called with the test results.   Today, 03/19/2021: She reports that the Annette Meierhas helped.  She had to take a second dose at times.  It helps better when she takes it right away.  She has had approximately 2 migraine attacks per month but they can last up to 5 days.  She has had a updated eye exam and has new eyeglasses.  She stopped taking her contraceptive pill but it  did not make a difference so she is back on it.  She stopped her sertraline recently on her own, it was 50 mg strength, she is advised not to stop any antidepressant or anxiety medication [place] without the advice of her provider.  She reports that she did not feel well on it and had no repercussions after stopping it.  She has a prescription for propranolol 10 mg as needed for anxiety.  She took 1 pill recently.  She tolerates it.  She hydrates well with water.  She is not always good with her sleep habits or sleep schedule.  She works full-time at [employer]   REVIEW OF SYSTEMS: Out of a complete 14 system review of symptoms, the patient complains only of the following symptoms, and all other reviewed systems are negative.   ALLERGIES: Allergies  Allergen Reactions   Peanut-Containing Drug Products Hives and Swelling    Peanuts     HOME MEDICATIONS: Outpatient Medications Prior to Visit  Medication Sig Dispense Refill   benzonatate (TESSALON) 100 MG capsule Take 1 capsule (100 mg total) by mouth every 8 (eight) hours. 21 capsule 0   cetirizine-pseudoephedrine (ZYRTEC-D) 5-120 MG tablet Take 1 tablet by mouth daily. 30 tablet 0   promethazine (PHENERGAN) 25 MG tablet Take 1 tablet (25 mg total) by mouth every 6 (six) hours as needed for nausea or vomiting. 30 tablet 0   propranolol (INDERAL)  20 MG tablet Take 1 tablet (20 mg total) by mouth daily. 30 tablet 5   Ubrogepant (UBRELVY) 100 MG TABS Take 100 mg by mouth as needed. May repeat in 2 hours if needed, no more than 2 pills in 24 hours. 10 tablet 3   No facility-administered medications prior to visit.     PAST MEDICAL HISTORY: Past Medical History:  Diagnosis Date   Bursitis    hips   GERD (gastroesophageal reflux disease)    Migraine headache    approx 2/month   PONV (postoperative nausea and vomiting)      PAST SURGICAL HISTORY: Past Surgical History:  Procedure Laterality Date   BIOPSY N/A 04/15/2020    Procedure: BIOPSY;  Surgeon: Virgel Manifold, MD;  Location: [place];  Service: Endoscopy;  Laterality: N/A;   CHOLECYSTECTOMY N/A 07/10/2020   Procedure: LAPAROSCOPIC CHOLECYSTECTOMY WITH INTRAOPERATIVE CHOLANGIOGRAM;  Surgeon: Aviva Signs, MD;  Location: [facility];  Service: General;  Laterality: N/A;   DENTAL SURGERY     ERCP N/A 07/11/2020   Procedure: ENDOSCOPIC RETROGRADE CHOLANGIOPANCREATOGRAPHY (ERCP);  Surgeon: Rogene Houston, MD;  [facility];  Service: Endoscopy;  Laterality: N/A;   ESOPHAGOGASTRODUODENOSCOPY (EGD) WITH PROPOFOL N/A 04/15/2020   Procedure: ESOPHAGOGASTRODUODENOSCOPY (EGD) WITH PROPOFOL;  Surgeon: Virgel Manifold, MD;  Location: [place];  Service: Endoscopy;  Laterality: N/A;   ESOPHAGOGASTRODUODENOSCOPY (EGD) WITH PROPOFOL N/A 12/25/2020   Procedure: ESOPHAGOGASTRODUODENOSCOPY (EGD) WITH PROPOFOL;  Surgeon: Milus Banister, MD;  [facility];  Service: Endoscopy;  Laterality: N/A;   EUS N/A 12/25/2020   Procedure: UPPER ENDOSCOPIC ULTRASOUND (EUS) RADIAL;  Surgeon: Milus Banister, MD;  [facility];  Service: Endoscopy;  Laterality: N/A;   SPHINCTEROTOMY N/A 07/11/2020   Procedure: SPHINCTEROTOMY PYLORICE CHANNEL DILALATION 15MM, NORMAL SIZE BILE DUCT;  Surgeon: Rogene Houston, MD;  [facility];  Service: Endoscopy;  Laterality: N/A;   STONE EXTRACTION WITH BASKET N/A 07/11/2020   Procedure: STONE EXTRACTION WITH WITH BALLOON;  Surgeon: Rogene Houston, MD;  [facility];  Service: Endoscopy;  Laterality: N/A;   TYMPANOSTOMY TUBE PLACEMENT       FAMILY HISTORY: Family History  Problem Relation Age of Onset   Gastric cancer Maternal Grandfather        "not sure where, but somewhere in the stomach area"   Colon cancer Neg Hx      SOCIAL HISTORY: Social History   Socioeconomic History   Marital status: Single    Spouse name: Not on file   Number of children: Not on  file   Years of education: Not on file   Highest education level: Not on file  Occupational History   Not on file  Tobacco Use   Smoking status: Former    Types: Cigarettes    Quit date: 12/03/2019    Years since quitting: 1.8   Smokeless tobacco: Never   Tobacco comments:    Smoked less than 1 pack per week for about 3 months  Vaping Use   Vaping Use: Never used  Substance and Sexual Activity   Alcohol use: Not Currently    Comment: may have 1-2 drinks/month. None currently.    Drug use: Yes    Types: Marijuana   Sexual activity: Yes  Other Topics Concern   Not on file  Social History Narrative   Not on file   Social Determinants of Health   Financial Resource Strain: Not on file  Food Insecurity:  Not on file  Transportation Needs: Not on file  Physical Activity: Not on file  Stress: Not on file  Social Connections: Not on file  Intimate Partner Violence: Not on file     PHYSICAL EXAM  There were no vitals filed for this visit. There is no height or weight on file to calculate BMI.  Generalized: Well developed, in no acute distress  Cardiology: normal rate and rhythm, no murmur auscultated  Respiratory: clear to auscultation bilaterally    Neurological examination  Mentation: Alert oriented to time, place, history taking. Follows all commands speech and language fluent Cranial nerve II-XII: Pupils were equal round reactive to light. Extraocular movements were full, visual field were full on confrontational test. Facial sensation and strength were normal. Uvula tongue midline. Head turning and shoulder shrug  were normal and symmetric. Motor: The motor testing reveals 5 over 5 strength of all 4 extremities. Good symmetric motor tone is noted throughout.  Sensory: Sensory testing is intact to soft touch on all 4 extremities. No evidence of extinction is noted.  Coordination: Cerebellar testing reveals good finger-nose-finger and heel-to-shin bilaterally.  Gait and  station: Gait is normal. Tandem gait is normal. Romberg is negative. No drift is seen.  Reflexes: Deep tendon reflexes are symmetric and normal bilaterally.    DIAGNOSTIC DATA (LABS, IMAGING, TESTING) - I reviewed patient records, labs, notes, testing and imaging myself where available.  Lab Results  Component Value Date   WBC 12.0 (H) 10/24/2020   HGB 14.4 10/24/2020   HCT 41.6 10/24/2020   MCV 78 (L) 10/24/2020   PLT 292 10/24/2020      Component Value Date/Time   NA 141 12/16/2020 0858   K 4.7 12/16/2020 0858   CL 105 12/16/2020 0858   CO2 19 (L) 12/16/2020 0858   GLUCOSE 86 12/16/2020 0858   GLUCOSE 100 (H) 09/14/2020 0950   BUN 9 12/16/2020 0858   CREATININE 0.66 12/16/2020 0858   CALCIUM 9.5 12/16/2020 0858   PROT 7.2 12/16/2020 0858   ALBUMIN 4.7 12/16/2020 0858   AST 15 12/16/2020 0858   ALT 9 12/16/2020 0858   ALKPHOS 65 12/16/2020 0858   BILITOT 0.3 12/16/2020 0858   GFRNONAA 61 10/24/2020 1009   GFRNONAA >60 09/14/2020 0950   GFRAA 70 10/24/2020 1009   Lab Results  Component Value Date   CHOL 176 07/08/2020   HDL 80 07/08/2020   LDLCALC 83 07/08/2020   TRIG 63 07/08/2020   CHOLHDL 2.2 07/08/2020   No results found for: HGBA1C No results found for: VITAMINB12 No results found for: TSH  No flowsheet data found.   No flowsheet data found.   ASSESSMENT AND PLAN  20 y.o. year old female  has a past medical history of Bursitis, GERD (gastroesophageal reflux disease), Migraine headache, and PONV (postoperative nausea and vomiting). here with    No diagnosis found.   No orders of the defined types were placed in this encounter.     No orders of the defined types were placed in this encounter.      Debbora Presto, MSN, FNP-C 09/24/2021, 3:40 PM  [facility] [address], [place], [place] [address] [phone]

## 2021-10-06 ENCOUNTER — Ambulatory Visit: Payer: Commercial Managed Care - PPO | Admitting: Family Medicine

## 2021-10-07 ENCOUNTER — Encounter: Payer: Self-pay | Admitting: Neurology

## 2021-10-07 ENCOUNTER — Ambulatory Visit (INDEPENDENT_AMBULATORY_CARE_PROVIDER_SITE_OTHER): Payer: Commercial Managed Care - PPO | Admitting: Neurology

## 2021-10-07 VITALS — BP 125/74 | HR 83 | Ht 62.0 in | Wt 156.0 lb

## 2021-10-07 DIAGNOSIS — R202 Paresthesia of skin: Secondary | ICD-10-CM | POA: Diagnosis not present

## 2021-10-07 DIAGNOSIS — G43101 Migraine with aura, not intractable, with status migrainosus: Secondary | ICD-10-CM | POA: Diagnosis not present

## 2021-10-07 DIAGNOSIS — E348 Other specified endocrine disorders: Secondary | ICD-10-CM | POA: Diagnosis not present

## 2021-10-07 MED ORDER — UBRELVY 100 MG PO TABS: 100.0000 mg | ORAL_TABLET | ORAL | 3 refills | Status: DC | PRN

## 2021-10-07 MED ORDER — PROPRANOLOL HCL 20 MG PO TABS: 20.0000 mg | ORAL_TABLET | Freq: Two times a day (BID) | ORAL | 5 refills | Status: DC

## 2021-10-07 NOTE — Patient Instructions (Addendum)
It was nice to see you again today. As discussed, let's increase your propranolol to 20 mg twice daily.  You can continue with Ubrelvy 100 mg strength as needed.  I will request a repeat brain MRI due to your pineal cyst, we will do the MRI in order around April for comparison from last year. Please follow-up in 6 months to see one of our nurse practitioners, we will call you with your brain MRI results in the interim.

## 2021-10-07 NOTE — Progress Notes (Signed)
Subjective:    Patient ID: Annette Gould is a 21 y.o. female.  HPI    Interim history:   Annette Gould is a 21 year old right-handed woman with an underlying medical history of reflux disease, anxiety, depression, history of bursitis, status-post cholecystectomy, who presents for follow-up consultation of her migraine headaches. The patient is unaccompanied today. I last saw her on 03/19/2021, at which time she reported that the [place] had helped.  She had to take a second dose at times. She had about 2 migraine attacks per month but they would last up to 5 days.  She had had a updated eye exam and has new eyeglasses.  She had stopped taking her contraceptive pill but it did not make a difference, so she went back on it.  She stopped her sertraline on her own, 50 mg strength. She had a prescription for propranolol 10 mg as needed for anxiety. I suggested she take Propranolol 20 mg daily for migraine prevention and I suggested we increase her Ubrelvy to 100 mg strength.  Today, 10/07/2021: She reports feeling fairly stable.  She has kept a log of her migraines in her cell phone which I reviewed, she has had some occasional tingling associated with her migraines, mostly tingling in her arms and face.  She has had no new symptoms as such, she has had 1 episode of lightheadedness, associated with tunnel vision.  She had a fall recently but did not trip over anything, felt like her legs were not under control.  She did not lose any consciousness or hurt herself thankfully.  She takes Annette Gould about 3 times per month on average.  She has not had any side effects from taking the propranolol which currently 20 mg once daily.  She has not started any other new medication, in particular, did not restart sertraline or any other antidepressant.  She hydrates well with water, drinks about 2 servings of caffeine containing beverages per day.  She does not drink any alcohol, she smokes marijuana in the evenings  occasionally.  She has been advised to be cautious with marijuana use because it can affect balance and can be sedating.   Previously:      I first met her at the request of her primary care provider on 12/16/2020, at which time she reported a longstanding history of migraine headaches since preteen years.  She has had intermittent tingling.  She had a nonfocal exam.  Migraine frequency did not necessarily warrant a preventative.  But she had prolonged migraines at times.  She was advised to start Ubrelvy as needed to 50 mg strength.  We talked about utilizing Nurtec or Qulipta as a preventative.  She was advised to get an eye exam done as she had not seen her eye doctor in some years.  She was also encouraged to talk to her GYN about switching her contraceptive pill to one with low estrogen or no estrogen.  I suggested we proceed with a brain MRI.   She had a brain MRI with and without contrast on 01/15/2021 and I reviewed the results:  IMPRESSION: 1. No evidence of acute intracranial abnormality. 2. Approximately 1 cm nonenhancing pineal cyst without significant mass effect.   She was called with the test results.   12/16/20: (She) reports a longstanding history of migraines since her preteen years.  She has a strong family history on her mom's side of migraines including mom, several aunts, and great aunts.  In December, she had a prolonged  migraine that lasted about 2 weeks, she had associated tingling, she started having a mild headache while at work, she works in a coffee shop.  She noticed that she had numbness and tingling in both hands and it expanded to involve both arms, both legs, including her face.  She did not seek immediate medical attention at the time.  She did have a severe migraine at the time including nausea and vomiting.  When she has a migraine she typically also has photophobia and sonophobia.  Migraines tend to be behind her eyes and extend upwards to the center of her head and  sometimes go backwards to the neck or base of the head.  She has no residual tingling or permanent numbness.  She has not had any weakness or slurring of speech or sudden onset of facial droop.  She does have occasional visual auras with her migraines including flickering lines, floaters, even tunnel vision.  She has prescription eyeglasses and reports that she has not had a formal eye examination in over 3 years.  She tries to hydrate well.  She tries to get 8 hours of sleep.  Triggers may include stress, occasional dehydration, and overheating.  She currently reports a migraine frequency of once every 2 weeks but they can last up to 3 days.  She does not have much in the way of tingling, certainly not as consistent as in December 2021.    I reviewed your telemedicine note from 10/10/2020. She reported increase in her headaches.  She reported intermittent tingling in her face, arms and legs.  She has been on sertraline, 50 mg strength 1-1/2 pills daily.  She has been on oral contraceptive pills.  She is taking pantoprazole for reflux.  She has been on propranolol 10 mg strength once daily. She drinks caffeine in the form of coffee, 2 cups/day, typically no later than lunchtime.  She drinks alcohol rarely to none.  She does smoke marijuana occasionally, does not smoke cigarettes.  She lives with her parents.     Her Past Medical History Is Significant For: Past Medical History:  Diagnosis Date   Bursitis    hips   GERD (gastroesophageal reflux disease)    Migraine headache    approx 2/month   PONV (postoperative nausea and vomiting)     Her Past Surgical History Is Significant For: Past Surgical History:  Procedure Laterality Date   BIOPSY N/A 04/15/2020   Procedure: BIOPSY;  Surgeon: Virgel Manifold, MD;  Location: [place];  Service: Endoscopy;  Laterality: N/A;   CHOLECYSTECTOMY N/A 07/10/2020   Procedure: LAPAROSCOPIC CHOLECYSTECTOMY WITH INTRAOPERATIVE CHOLANGIOGRAM;   Surgeon: Aviva Signs, MD;  Location: [facility];  Service: General;  Laterality: N/A;   DENTAL SURGERY     ERCP N/A 07/11/2020   Procedure: ENDOSCOPIC RETROGRADE CHOLANGIOPANCREATOGRAPHY (ERCP);  Surgeon: Rogene Houston, MD;  Location[facility];  Service: Endoscopy;  Laterality: N/A;   ESOPHAGOGASTRODUODENOSCOPY (EGD) WITH PROPOFOL N/A 04/15/2020   Procedure: ESOPHAGOGASTRODUODENOSCOPY (EGD) WITH PROPOFOL;  Surgeon: Virgel Manifold, MD;  Location: [place];  Service: Endoscopy;  Laterality: N/A;   ESOPHAGOGASTRODUODENOSCOPY (EGD) WITH PROPOFOL N/A 12/25/2020   Procedure: ESOPHAGOGASTRODUODENOSCOPY (EGD) WITH PROPOFOL;  Surgeon: Milus Banister, MD;  Location: [facility];  Service: Endoscopy;  Laterality: N/A;   EUS N/A 12/25/2020   Procedure: UPPER ENDOSCOPIC ULTRASOUND (EUS) RADIAL;  Surgeon: Milus Banister, MD;  Location: [facility];  Service: Endoscopy;  Laterality: N/A;   SPHINCTEROTOMY N/A 07/11/2020  Procedure: SPHINCTEROTOMY PYLORICE CHANNEL DILALATION 15MM, NORMAL SIZE BILE DUCT;  Surgeon: Rogene Houston, MD;  Location[facility];  Service: Endoscopy;  Laterality: N/A;   STONE EXTRACTION WITH BASKET N/A 07/11/2020   Procedure: STONE EXTRACTION WITH WITH BALLOON;  Surgeon: Rogene Houston, MD;  [facility];  Service: Endoscopy;  Laterality: N/A;   TYMPANOSTOMY TUBE PLACEMENT      Her Family History Is Significant For: Family History  Problem Relation Age of Onset   Migraines Mother    Gastric cancer Maternal Grandfather        "not sure where, but somewhere in the stomach area"   Colon cancer Neg Hx     Her Social History Is Significant For: Social History   Socioeconomic History   Marital status: Single    Spouse name: Not on file   Number of children: Not on file   Years of education: Not on file   Highest education level: Not on file  Occupational History   Not on file  Tobacco Use   Smoking status: Former    Types: Cigarettes     Quit date: 12/03/2019    Years since quitting: 1.8   Smokeless tobacco: Never   Tobacco comments:    Smoked less than 1 pack per week for about 3 months  Vaping Use   Vaping Use: Never used  Substance and Sexual Activity   Alcohol use: Not Currently    Comment: may have 1-2 drinks/month. None currently.    Drug use: Yes    Types: Marijuana   Sexual activity: Yes  Other Topics Concern   Not on file  Social History Narrative   Not on file   Social Determinants of Health   Financial Resource Strain: Not on file  Food Insecurity: Not on file  Transportation Needs: Not on file  Physical Activity: Not on file  Stress: Not on file  Social Connections: Not on file    Her Allergies Are:  Allergies  Allergen Reactions   Peanut-Containing Drug Products Hives and Swelling    Peanuts  :   Her Current Medications Are:  Outpatient Encounter Medications as of 10/07/2021  Medication Sig   benzonatate (TESSALON) 100 MG capsule Take 1 capsule (100 mg total) by mouth every 8 (eight) hours.   cetirizine-pseudoephedrine (ZYRTEC-D) 5-120 MG tablet Take 1 tablet by mouth daily.   promethazine (PHENERGAN) 25 MG tablet Take 1 tablet (25 mg total) by mouth every 6 (six) hours as needed for nausea or vomiting.   propranolol (INDERAL) 20 MG tablet Take 1 tablet (20 mg total) by mouth daily.   Ubrogepant (UBRELVY) 100 MG TABS Take 100 mg by mouth as needed. May repeat in 2 hours if needed, no more than 2 pills in 24 hours.   [DISCONTINUED] famotidine (PEPCID) 20 MG tablet Take 1 tablet (20 mg total) by mouth 2 (two) times daily.   No facility-administered encounter medications on file as of 10/07/2021.  :  Review of Systems:  Out of a complete 14 point review of systems, all are reviewed and negative with the exception of these symptoms as listed below:  Review of Systems  Neurological:        Pt states she is here for follow up with migraines . Pt has a log of Migraines and headaches she has  had since end  of may 2022 . Pt states she has no questions or concerns for todays visit .    Objective:  Neurological  Exam  Physical Exam Physical Examination:   Vitals:   10/07/21 1246  BP: 125/74  Pulse: 83    General Examination: The patient is a very pleasant 21 y.o. female in no acute distress. She appears well-developed and well-nourished and well groomed.   HEENT: Normocephalic, atraumatic, pupils are equal, round and reactive to light, corrective eyeglasses in place, extraocular tracking is well-preserved, face is symmetric with normal facial animation, airway exam reveals stable findings.    Chest: Clear to auscultation without wheezing, rhonchi or crackles noted.   Heart: S1+S2+0, regular and normal without murmurs, rubs or gallops noted.   Abdomen: Soft, non-tender and non-distended.   Extremities: There is no pitting edema in the distal lower extremities bilaterally.   Skin: Warm and dry without trophic changes noted.   Musculoskeletal: exam reveals no obvious joint deformities.   Neurologically: Mental status: The patient is awake, alert and oriented in all 4 spheres. Her immediate and remote memory, attention, language skills and fund of knowledge are appropriate. There is no evidence of aphasia, agnosia, apraxia or anomia. Speech is clear with normal prosody and enunciation. Thought process is linear. Mood is normal and affect is normal. Cranial nerves II - XII are as described above under HEENT exam.  Motor exam: Normal bulk, strength and tone is noted.  Reflexes 2+ throughout.  There is no drift or tremor, no rebound. Romberg is negative. Fine motor skills and coordination: Grossly intact throughout.  Normal finger taps and foot taps, normal hand movements, normal rapid alternating patting.  Sensory exam is intact to light touch throuout.  Cerebellar testing: No dysmetria or intention tremor. There is no truncal or gait ataxia.  Heel-to-shin and finger-to-nose  unremarkable. Gait, station and balance: She stands easily. No veering to one side is noted. No leaning to one side is noted. Posture is age-appropriate and stance is narrow based. Gait shows normal stride length and normal pace. No problems turning are noted. Tandem walk is unremarkable.   Assessment and Plan:    In summary, Annette Gould is a very pleasant 21 year old female with an underlying medical history of reflux disease, anxiety, depression, history of bursitis, and recent status post cholecystectomy, who presents for follow-up consultation of her migraine headaches.  She has had fairly good results with Annette Gould as needed, we increase this to 100 mg strength in June 2022.  She is on propranolol 20 mg once daily and I suggested we increase this to 20 mg twice daily at this time.  She has an average of 2-3 migraines per month at this time, sometimes she has had abdominal migraine and also association with paresthesias which have not changed much.  MRI brain from April 2022 showed an incidental finding of a pineal cyst.  I suggested we repeat her brain MRI at this time and schedule it for around April of this year.  She is advised that we will call her with her MRI results, neurological exam is nonfocal.  I renewed her prescription for Ubrelvy and increased her propranolol prescription at this time.  She is encouraged to talk to her primary care physician if she has residual anxiety or depressive symptoms since she stopped sertraline last year.  She is advised to follow-up in this clinic to see one of our nurse practitioners routinely in 6 months, sooner if needed.  I answered all her questions today and she was in agreement.   I spent 30 minutes in total face-to-face time and in reviewing records during  pre-charting, more than 50% of which was spent in counseling and coordination of care, reviewing test results, reviewing medications and treatment regimen and/or in discussing or reviewing the diagnosis  of Migraines, the prognosis and treatment options. Pertinent laboratory and imaging test results that were available during this visit with the patient were reviewed by me and considered in my medical decision making (see chart for details).

## 2021-10-19 ENCOUNTER — Telehealth: Payer: Self-pay | Admitting: Neurology

## 2021-10-19 NOTE — Telephone Encounter (Signed)
[facility]: [id number] (exp. 10/19/21 to 01/16/22) order sent to [facility] to be schedule at [facility]

## 2021-11-03 ENCOUNTER — Other Ambulatory Visit: Payer: Self-pay

## 2021-11-03 ENCOUNTER — Ambulatory Visit (HOSPITAL_COMMUNITY)
Admission: RE | Admit: 2021-11-03 | Discharge: 2021-11-03 | Disposition: A | Discharge status: Home / Self Care | Payer: Commercial Managed Care - PPO | Source: Ambulatory Visit | Attending: Neurology | Admitting: Neurology

## 2021-11-03 DIAGNOSIS — R202 Paresthesia of skin: Secondary | ICD-10-CM | POA: Diagnosis present

## 2021-11-03 DIAGNOSIS — E348 Other specified endocrine disorders: Secondary | ICD-10-CM | POA: Diagnosis present

## 2021-11-03 DIAGNOSIS — G43101 Migraine with aura, not intractable, with status migrainosus: Secondary | ICD-10-CM | POA: Diagnosis not present

## 2021-11-03 IMAGING — MR MR HEAD WO/W CM
15 of 17 series · 32 of 48 positions shown · IV contrast (gadavist)
Comparison: Brain MRI [DATE].

CLINICAL DATA: Provided history: Migraine with YANA and with status
migrainosus, not intractable. Paresthesias. Pineal gland cyst.
Brain/CNS neoplasm, monitor; surveillance brain MRI to re-evaluate
pineal cyst which was found in [DATE].

EXAM:
MRI HEAD WITHOUT AND WITH CONTRAST
TECHNIQUE: Multiplanar, multiecho pulse sequences of the brain and surrounding
structures were obtained without and with intravenous contrast.
CONTRAST:  7mL GADAVIST GADOBUTROL 1 MMOL/ML IV SOLN

[Series 5: DWI · axial · 3.0mm · 0.77mm/px · z∈[-89,+46]mm · 2 of 48 slices shown (1 of 6)]
[im 1/48]
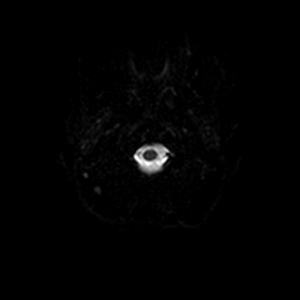
[im 48/48]
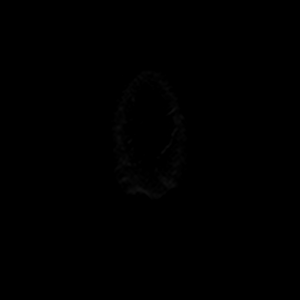

[Series 5: DWI · axial · 3.0mm · 0.77mm/px · z∈[-89,+46]mm · 3 of 48 slices shown (2 of 6)]
[im 1/48]
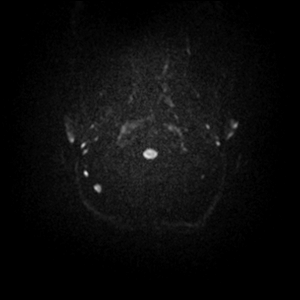
[im 24/48]
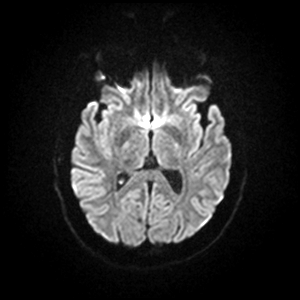
[im 48/48]
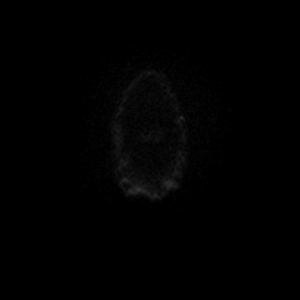

[Series 6: DWI · axial · 3.0mm · 0.77mm/px · z∈[-89,+46]mm · 3 of 48 slices shown (3 of 6)]
[im 1/48]
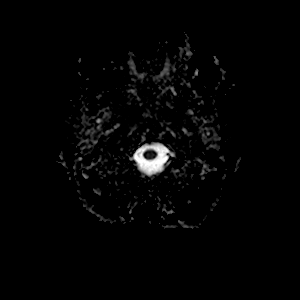
[im 24/48]
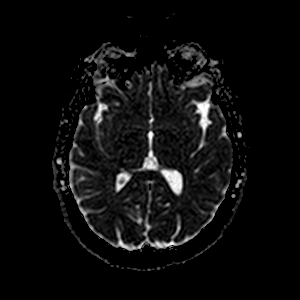
[im 48/48]
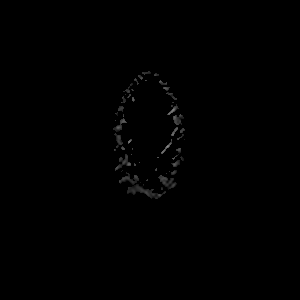

[Series 7: DWI · coronal · 5.0mm · 0.88mm/px · 2 of 28 slices shown (4 of 6)]
[im 1/28]
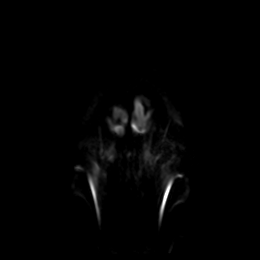
[im 28/28]
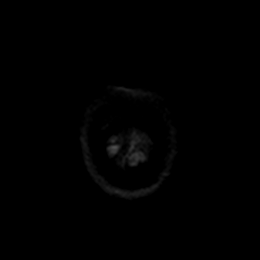

[Series 7: DWI · coronal · 5.0mm · 0.88mm/px · 2 of 28 slices shown (5 of 6)]
[im 1/28]
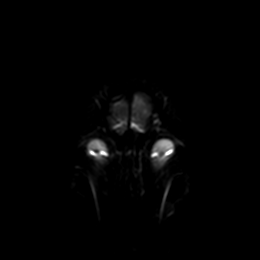
[im 28/28]
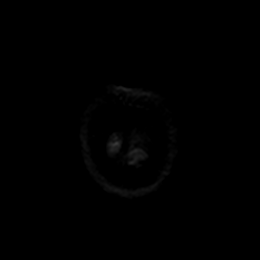

[Series 8: DWI · coronal · 5.0mm · 0.88mm/px · 2 of 28 slices shown (6 of 6)]
[im 1/28]
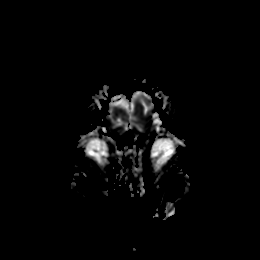
[im 28/28]
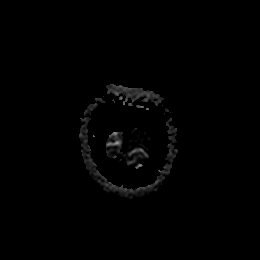

[Series 9: T1 · sagittal · 5.0mm · 0.75mm/px · 1 of 19 slices shown]
[im 1/19]
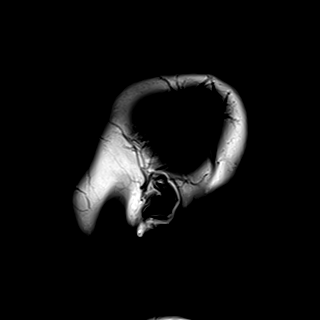

[Series 10: T2 · axial · 5.0mm · 0.72mm/px · 1 of 20 slices shown]
[im 1/20]
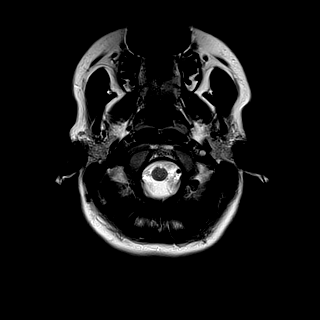

[Series 11: mag_images · axial · 3.0mm · 0.90mm/px · z∈[-99,+72]mm · 3 of 60 slices shown]
[im 1/60]
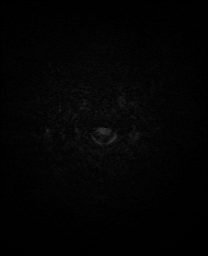
[im 30/60]
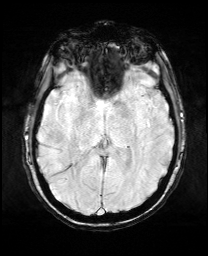
[im 60/60]
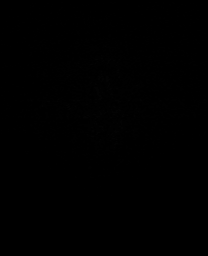

[Series 12: pha_images · axial · 3.0mm · 0.90mm/px · z∈[-99,+67]mm · 3 of 58 slices shown]
[im 1/58]
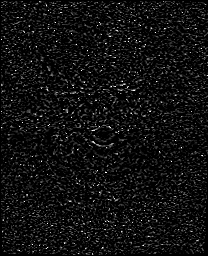
[im 29/58]
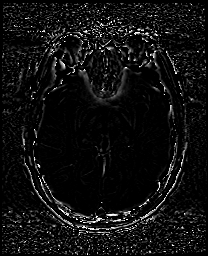
[im 58/58]
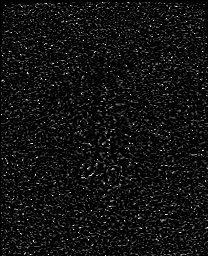

[Series 13: swi_images · axial · 3.0mm · 0.90mm/px · z∈[-99,+72]mm · 3 of 60 slices shown]
[im 1/60]
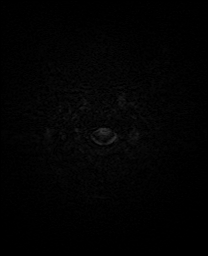
[im 30/60]
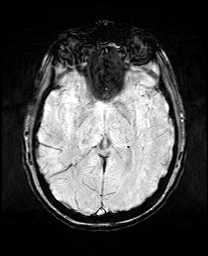
[im 60/60]
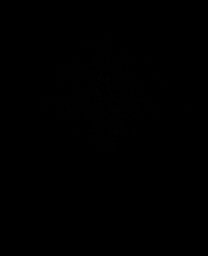

[Series 15: FLAIR · axial · 3.0mm · 0.45mm/px · z∈[-70,+43]mm · 2 of 40 slices shown]
[im 1/40]
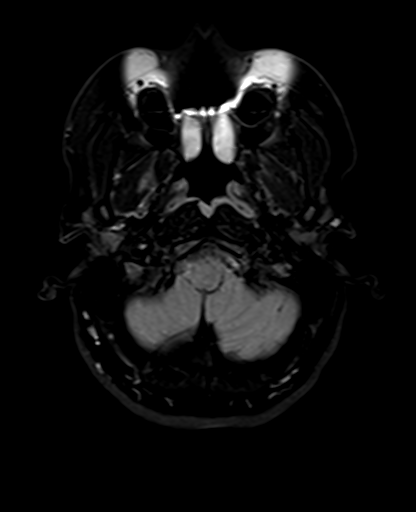
[im 40/40]
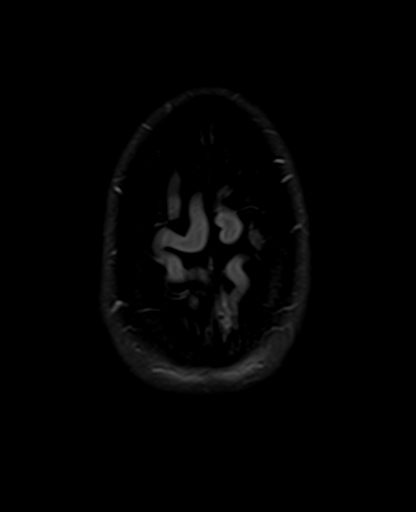

[Series 17: T2 post-contrast · coronal · 5.0mm · 0.72mm/px · 2 of 28 slices shown]
[im 1/28]
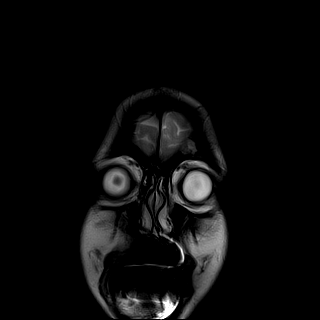
[im 28/28]
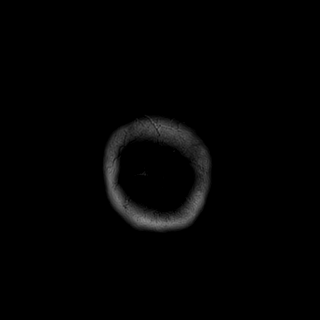

[Series 19: T1 post-contrast · coronal · 5.0mm · 0.34mm/px · 2 of 28 slices shown (1 of 2)]
[im 1/28]
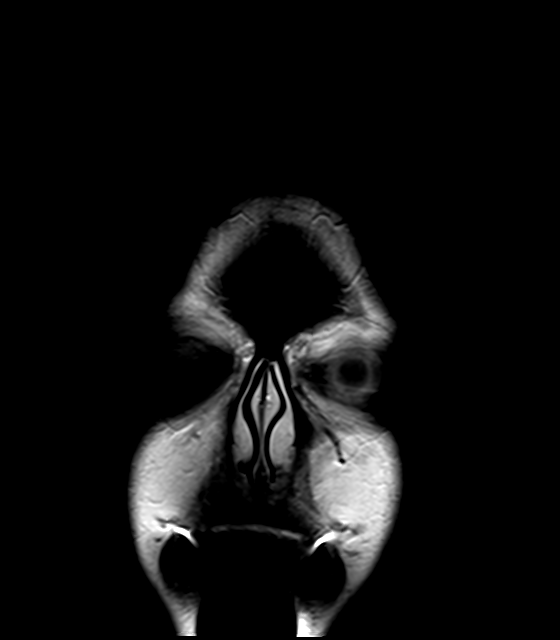
[im 28/28]
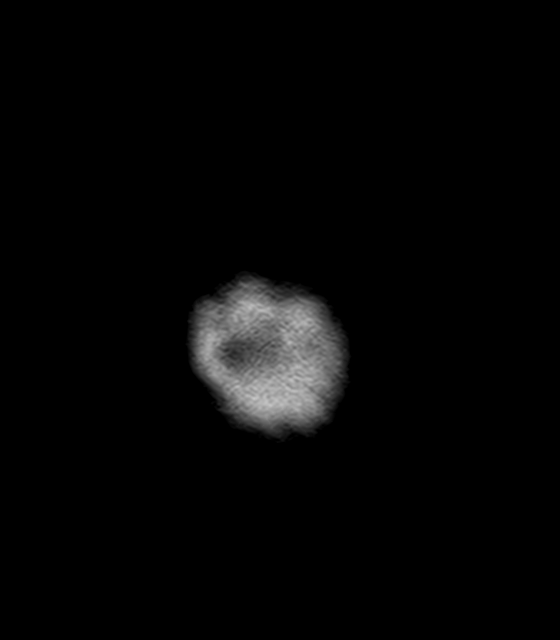

[Series 20: T1 post-contrast · sagittal · 5.0mm · 0.72mm/px · 1 of 19 slices shown (2 of 2)]
[im 1/19]
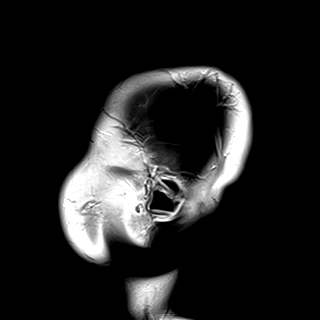

[32 of 48 positions shown; findings below may reference images not displayed]

FINDINGS: Brain:

Cerebral volume is normal.

A 13 x 10 mm pineal cyst is unchanged in size and appearance as
compared to the brain MRI of [DATE] (remeasured on prior). There
is no associated suspicious mass-like or nodular enhancement. No
significant mass effect.

No cortical encephalomalacia is identified. No significant cerebral
white matter disease.

There is no acute infarct.

No evidence of an intracranial mass.

No chronic intracranial blood products.

No extra-axial fluid collection.

No midline shift.

No pathologic intracranial enhancement identified.

Vascular: Maintained flow voids within the proximal large arterial
vessels. Dominant left vertebral artery

Skull and upper cervical spine: No focal suspicious marrow lesion.

Sinuses/Orbits: Visualized orbits show no acute finding. No
significant paranasal sinus disease.
IMPRESSION: 13 x 10 mm pineal cyst without suspicious mass-like or nodular
enhancement, stable in size and appearance as compared to the brain
MRI of [DATE]. No significant mass effect.

Otherwise unremarkable MRI appearance of the brain.

## 2021-11-03 MED ORDER — GADOBUTROL 1 MMOL/ML IV SOLN
7.0000 mL | Freq: Once | INTRAVENOUS | Status: AC | PRN
  Administered 2021-11-03: 7 mL via INTRAVENOUS

## 2021-12-23 ENCOUNTER — Ambulatory Visit: Payer: Commercial Managed Care - PPO | Admitting: Family Medicine

## 2022-04-08 ENCOUNTER — Other Ambulatory Visit: Payer: Self-pay | Admitting: Neurology

## 2022-04-08 ENCOUNTER — Ambulatory Visit (INDEPENDENT_AMBULATORY_CARE_PROVIDER_SITE_OTHER): Payer: Commercial Managed Care - PPO | Admitting: Adult Health

## 2022-04-08 ENCOUNTER — Encounter: Payer: Self-pay | Admitting: Adult Health

## 2022-04-08 VITALS — BP 122/80 | HR 78 | Ht 62.0 in | Wt 151.2 lb

## 2022-04-08 DIAGNOSIS — G43101 Migraine with aura, not intractable, with status migrainosus: Secondary | ICD-10-CM | POA: Diagnosis not present

## 2022-04-08 MED ORDER — PROPRANOLOL HCL 20 MG PO TABS: 20.0000 mg | ORAL_TABLET | Freq: Three times a day (TID) | ORAL | 5 refills | Status: DC

## 2022-04-08 NOTE — Patient Instructions (Signed)
Your Plan:  Increase propranolol to 20 mg three times a day Continue ubrelvy If your symptoms worsen or you develop new symptoms please let us know.    Thank you for coming to see [place] at [facility]. I hope we have been able to provide you high quality care today.  You may receive a patient satisfaction survey over the next few weeks. We would appreciate your feedback and comments so that we may continue to improve ourselves and the health of our patients.

## 2022-04-08 NOTE — Progress Notes (Signed)
PATIENT: Annette Gould DOB: 05-16-2001  REASON FOR VISIT: follow up HISTORY FROM: patient PRIMARY NEUROLOGIST: Dr. Frances Furbish  Chief Complaint  Patient presents with   Follow-up    Rm 19, alone.  Still having word slurring or body numbness.  Photosensitivity .       HISTORY OF PRESENT ILLNESS: Today 04/08/22:  Annette Gould is a 21 year old female with a history of migraine. She returns today for follow-up. Migraines are better. Reports that she has a headache "symptoms "every other day. Has numbness in hands or face. Feels sometimes that her whole body goes numb then followed by headache the next day. Sometimes she will just have the numbness and tingling. She has headache about 3 times a week.  Denies any nausea or vomiting. Light and noise sensitivity.  Reports trouble holding things.  Mother has rheumatoid arthritis.  Patient plans to discuss with her PCP  HISTORY 10/07/2021: She reports feeling fairly stable.  She has kept a log of her migraines in her cell phone which I reviewed, she has had some occasional tingling associated with her migraines, mostly tingling in her arms and face.  She has had no new symptoms as such, she has had 1 episode of lightheadedness, associated with tunnel vision.  She had a fall recently but did not trip over anything, felt like her legs were not under control.  She did not lose any consciousness or hurt herself thankfully.  She takes Bernita Raisin about 3 times per month on average.  She has not had any side effects from taking the propranolol which currently 20 mg once daily.  She has not started any other new medication, in particular, did not restart sertraline or any other antidepressant.  She hydrates well with water, drinks about 2 servings of caffeine containing beverages per day.  She does not drink any alcohol, she smokes marijuana in the evenings occasionally.  She has been advised to be cautious with marijuana use because it can affect balance and can be  sedating.  REVIEW OF SYSTEMS: Out of a complete 14 system review of symptoms, the patient complains only of the following symptoms, and all other reviewed systems are negative.  ALLERGIES: Allergies  Allergen Reactions   Peanut-Containing Drug Products Hives and Swelling    Peanuts    HOME MEDICATIONS: Outpatient Medications Prior to Visit  Medication Sig Dispense Refill   promethazine (PHENERGAN) 25 MG tablet Take 1 tablet (25 mg total) by mouth every 6 (six) hours as needed for nausea or vomiting. 30 tablet 0   propranolol (INDERAL) 20 MG tablet Take 1 tablet (20 mg total) by mouth 2 (two) times daily. 60 tablet 5   Ubrogepant (UBRELVY) 100 MG TABS Take 100 mg by mouth as needed. May repeat in 2 hours if needed, no more than 2 pills in 24 hours. 10 tablet 3   benzonatate (TESSALON) 100 MG capsule Take 1 capsule (100 mg total) by mouth every 8 (eight) hours. 21 capsule 0   cetirizine-pseudoephedrine (ZYRTEC-D) 5-120 MG tablet Take 1 tablet by mouth daily. 30 tablet 0   No facility-administered medications prior to visit.    PAST MEDICAL HISTORY: Past Medical History:  Diagnosis Date   Bursitis    hips   GERD (gastroesophageal reflux disease)    Migraine headache    approx 2/month   PONV (postoperative nausea and vomiting)     PAST SURGICAL HISTORY: Past Surgical History:  Procedure Laterality Date   BIOPSY N/A 04/15/2020  Procedure: BIOPSY;  Surgeon: Pasty Spillers, MD;  Location: [facility];  Service: Endoscopy;  Laterality: N/A;   CHOLECYSTECTOMY N/A 07/10/2020   Procedure: LAPAROSCOPIC CHOLECYSTECTOMY WITH INTRAOPERATIVE CHOLANGIOGRAM;  Surgeon: [name], MD;  Location[facility];  Service: General;  Laterality: N/A;   DENTAL SURGERY     ERCP N/A 07/11/2020   Procedure: ENDOSCOPIC RETROGRADE CHOLANGIOPANCREATOGRAPHY (ERCP);  Surgeon: Malissa Hippo, MD;  [facility];  Service: Endoscopy;  Laterality: N/A;   ESOPHAGOGASTRODUODENOSCOPY (EGD)  WITH PROPOFOL N/A 04/15/2020   Procedure: ESOPHAGOGASTRODUODENOSCOPY (EGD) WITH PROPOFOL;  Surgeon: Pasty Spillers, MD;  Location: [facility];  Service: Endoscopy;  Laterality: N/A;   ESOPHAGOGASTRODUODENOSCOPY (EGD) WITH PROPOFOL N/A 12/25/2020   Procedure: ESOPHAGOGASTRODUODENOSCOPY (EGD) WITH PROPOFOL;  Surgeon: Rachael Fee, MD;  Location: [facility];  Service: Endoscopy;  Laterality: N/A;   EUS N/A 12/25/2020   Procedure: UPPER ENDOSCOPIC ULTRASOUND (EUS) RADIAL;  Surgeon: Rachael Fee, MD;  Location: [facility];  Service: Endoscopy;  Laterality: N/A;   SPHINCTEROTOMY N/A 07/11/2020   Procedure: SPHINCTEROTOMY PYLORICE CHANNEL DILALATION , NORMAL SIZE BILE DUCT;  Surgeon: Malissa Hippo, MD;  [facility];  Service: Endoscopy;  Laterality: N/A;   STONE EXTRACTION WITH BASKET N/A 07/11/2020   Procedure: STONE EXTRACTION WITH WITH BALLOON;  Surgeon: Malissa Hippo, MD;  [facility];  Service: Endoscopy;  Laterality: N/A;   TYMPANOSTOMY TUBE PLACEMENT      FAMILY HISTORY: Family History  Problem Relation Age of Onset   Migraines Mother    Gastric cancer Maternal Grandfather        "not sure where, but somewhere in the stomach area"   Colon cancer Neg Hx     SOCIAL HISTORY: Social History   Socioeconomic History   Marital status: Single    Spouse name: Not on file   Number of children: Not on file   Years of education: Not on file   Highest education level: Not on file  Occupational History   Not on file  Tobacco Use   Smoking status: Former    Types: Cigarettes    Quit date: 12/03/2019    Years since quitting: 2.3   Smokeless tobacco: Never   Tobacco comments:    Smoked less than 1 pack per week for about 3 months  Vaping Use   Vaping Use: Never used  Substance and Sexual Activity   Alcohol use: Not Currently    Comment: may have 1-2 drinks/month. None currently.    Drug use: Yes    Types: Marijuana   Sexual  activity: Yes  Other Topics Concern   Not on file  Social History Narrative   Not on file   Social Determinants of Health   Financial Resource Strain: Not on file  Food Insecurity: Not on file  Transportation Needs: Not on file  Physical Activity: Not on file  Stress: Not on file  Social Connections: Not on file  Intimate Partner Violence: Not on file      PHYSICAL EXAM  Vitals:   04/08/22 1313  BP: 122/80  Pulse: 78  Weight: 151 lb 3.2 oz (68.6 kg)  Height: 5\' 2"  (1.575 m)   Body mass index is 27.65 kg/m.  Generalized: Well developed, in no acute distress   Neurological examination  Mentation: Alert oriented to time, place, history taking. Follows all commands speech and language fluent Cranial nerve II-XII: Pupils were equal round reactive to light. Extraocular movements were full, visual  field were full on confrontational test. Facial sensation and strength were normal. Head turning and shoulder shrug  were normal and symmetric. Motor: The motor testing reveals 5 over 5 strength of all 4 extremities. Good symmetric motor tone is noted throughout.  Sensory: Sensory testing is intact to soft touch on all 4 extremities. No evidence of extinction is noted.  Coordination: Cerebellar testing reveals good finger-nose-finger and heel-to-shin bilaterally.  Gait and station: Gait is normal.    DIAGNOSTIC DATA (LABS, IMAGING, TESTING) - I reviewed patient records, labs, notes, testing and imaging myself where available.  Lab Results  Component Value Date   WBC 12.0 (H) 10/24/2020   HGB 14.4 10/24/2020   HCT 41.6 10/24/2020   MCV 78 (L) 10/24/2020   PLT 292 10/24/2020      Component Value Date/Time   NA 141 12/16/2020 0858   K 4.7 12/16/2020 0858   CL 105 12/16/2020 0858   CO2 19 (L) 12/16/2020 0858   GLUCOSE 86 12/16/2020 0858   GLUCOSE 100 (H) 09/14/2020 0950   BUN 9 12/16/2020 0858   CREATININE 0.66 12/16/2020 0858   CALCIUM 9.5 12/16/2020 0858   PROT 7.2  12/16/2020 0858   ALBUMIN 4.7 12/16/2020 0858   AST 15 12/16/2020 0858   ALT 9 12/16/2020 0858   ALKPHOS 65 12/16/2020 0858   BILITOT 0.3 12/16/2020 0858   GFRNONAA 61 10/24/2020 1009   GFRNONAA >60 09/14/2020 0950   GFRAA 70 10/24/2020 1009   Lab Results  Component Value Date   CHOL 176 07/08/2020   HDL 80 07/08/2020   LDLCALC 83 07/08/2020   TRIG 63 07/08/2020   CHOLHDL 2.2 07/08/2020       ASSESSMENT AND PLAN 21 y.o. year old female  has a past medical history of Bursitis, GERD (gastroesophageal reflux disease), Migraine headache, and PONV (postoperative nausea and vomiting). here with :  1.  Migraine headache  -Increase propranolol to 20 mg 3 times a day -Continue Ubrelvy for abortive therapy advised to take at the onset of symptoms.  Can repeat in 2 hours if needed -Follow-up in 6 months or sooner if needed     Butch Penny, MSN, NP-C 04/08/2022, 1:12 PM [facility] [address] [place], [place] [address] [phone]

## 2022-10-28 ENCOUNTER — Ambulatory Visit (INDEPENDENT_AMBULATORY_CARE_PROVIDER_SITE_OTHER): Payer: Commercial Managed Care - PPO | Admitting: Adult Health

## 2022-10-28 ENCOUNTER — Encounter: Payer: Self-pay | Admitting: Adult Health

## 2022-10-28 VITALS — BP 115/60 | HR 57 | Ht 62.0 in | Wt 152.0 lb

## 2022-10-28 DIAGNOSIS — G43101 Migraine with aura, not intractable, with status migrainosus: Secondary | ICD-10-CM

## 2022-10-28 MED ORDER — NORTRIPTYLINE HCL 10 MG PO CAPS: 10.0000 mg | ORAL_CAPSULE | Freq: Every day | ORAL | 3 refills | Status: DC

## 2022-10-28 MED ORDER — PROPRANOLOL HCL 20 MG PO TABS: 20.0000 mg | ORAL_TABLET | Freq: Two times a day (BID) | ORAL | 5 refills | Status: DC

## 2022-10-28 NOTE — Patient Instructions (Signed)
Your Plan:  Continue ubrelvy for abortive therapy  Decrease propranolol to 20 mg twice a day Start Nortriptyline 10 mg at bedtime If your symptoms worsen or you develop new symptoms please let us know.    Thank you for coming to see [place] at [facility]. I hope we have been able to provide you high quality care today.  You may receive a patient satisfaction survey over the next few weeks. We would appreciate your feedback and comments so that we may continue to improve ourselves and the health of our patients.

## 2022-10-28 NOTE — Progress Notes (Signed)
PATIENT: Annette Gould DOB: 2001-04-05  REASON FOR VISIT: follow up HISTORY FROM: patient PRIMARY NEUROLOGIST: Dr. Rexene Alberts  Chief Complaint  Patient presents with   RM 4    Patient is here alone for migraine f/u. She has been tracking her migraines on the migraine buddy app. She says they have been ok. She has Ubrelvy PRN and Propranolol for prevention.      HISTORY OF PRESENT ILLNESS: Today 10/28/22:  Annette Gould is a 22 y.o. female with a history of migraine headaches. Returns today for follow-up.  She reports that she uses a phone app to help track her migraines.  She states that they have been "ok."  Having approximately 10-15 headache days a month.  Continues on propranolol 20 mg 3 times a day and takes [place] for abortive therapy. Does get a aura- usually visual changes, or trouble holding things.  She reports that [name] has been beneficial but she often does not have it with her when she develops a headache.  She is also noticed that she is more sluggish since increasing her dose of propranolol     04/08/22: Annette Gould is a 22 year old female with a history of migraine. She returns today for follow-up. Migraines are better. Reports that she has a headache "symptoms "every other day. Has numbness in hands or face. Feels sometimes that her whole body goes numb then followed by headache the next day. Sometimes she will just have the numbness and tingling. She has headache about 3 times a week.  Denies any nausea or vomiting. Light and noise sensitivity.  Reports trouble holding things.  Mother has rheumatoid arthritis.  Patient plans to discuss with her PCP  HISTORY 10/07/2021: She reports feeling fairly stable.  She has kept a log of her migraines in her cell phone which I reviewed, she has had some occasional tingling associated with her migraines, mostly tingling in her arms and face.  She has had no new symptoms as such, she has had 1 episode of lightheadedness, associated  with tunnel vision.  She had a fall recently but did not trip over anything, felt like her legs were not under control.  She did not lose any consciousness or hurt herself thankfully.  She takes [name] about 3 times per month on average.  She has not had any side effects from taking the propranolol which currently 20 mg once daily.  She has not started any other new medication, in particular, did not restart sertraline or any other antidepressant.  She hydrates well with water, drinks about 2 servings of caffeine containing beverages per day.  She does not drink any alcohol, she smokes marijuana in the evenings occasionally.  She has been advised to be cautious with marijuana use because it can affect balance and can be sedating.  REVIEW OF SYSTEMS: Out of a complete 14 system review of symptoms, the patient complains only of the following symptoms, and all other reviewed systems are negative.  ALLERGIES: Allergies  Allergen Reactions   Peanut-Containing Drug Products Hives and Swelling    Peanuts    HOME MEDICATIONS: Outpatient Medications Prior to Visit  Medication Sig Dispense Refill   norethindrone (MICRONOR) 0.35 MG tablet Take 1 tablet by mouth daily.     propranolol (INDERAL) 20 MG tablet Take 1 tablet (20 mg total) by mouth 3 (three) times daily. 60 tablet 5   UBRELVY 100 MG TABS TAKE 1 TABLET BY MOUTH AS NEEDED. MAY REPEAT IN 2  HOURS IF NEEDED, NO MORE THAN 2 TABLETS IN 24 HOURS 10 tablet 3   No facility-administered medications prior to visit.    PAST MEDICAL HISTORY: Past Medical History:  Diagnosis Date   Bursitis    hips   GERD (gastroesophageal reflux disease)    Migraine headache    approx 2/month   PONV (postoperative nausea and vomiting)     PAST SURGICAL HISTORY: Past Surgical History:  Procedure Laterality Date   BIOPSY N/A 04/15/2020   Procedure: BIOPSY;  Surgeon: Virgel Manifold, MD;  Location: [place];  Service: Endoscopy;  Laterality: N/A;    CHOLECYSTECTOMY N/A 07/10/2020   Procedure: LAPAROSCOPIC CHOLECYSTECTOMY WITH INTRAOPERATIVE CHOLANGIOGRAM;  Surgeon: Aviva Signs, MD;  Location: [facility];  Service: General;  Laterality: N/A;   DENTAL SURGERY     ERCP N/A 07/11/2020   Procedure: ENDOSCOPIC RETROGRADE CHOLANGIOPANCREATOGRAPHY (ERCP);  Surgeon: Rogene Houston, MD;  [facility];  Service: Endoscopy;  Laterality: N/A;   ESOPHAGOGASTRODUODENOSCOPY (EGD) WITH PROPOFOL N/A 04/15/2020   Procedure: ESOPHAGOGASTRODUODENOSCOPY (EGD) WITH PROPOFOL;  Surgeon: Virgel Manifold, MD;  Location: [place];  Service: Endoscopy;  Laterality: N/A;   ESOPHAGOGASTRODUODENOSCOPY (EGD) WITH PROPOFOL N/A 12/25/2020   Procedure: ESOPHAGOGASTRODUODENOSCOPY (EGD) WITH PROPOFOL;  Surgeon: Milus Banister, MD;  [facility];  Service: Endoscopy;  Laterality: N/A;   EUS N/A 12/25/2020   Procedure: UPPER ENDOSCOPIC ULTRASOUND (EUS) RADIAL;  Surgeon: Milus Banister, MD;  Location[facility];  Service: Endoscopy;  Laterality: N/A;   SPHINCTEROTOMY N/A 07/11/2020   Procedure: SPHINCTEROTOMY PYLORICE CHANNEL DILALATION 15MM, NORMAL SIZE BILE DUCT;  Surgeon: Rogene Houston, MD;  [facility];  Service: Endoscopy;  Laterality: N/A;   STONE EXTRACTION WITH BASKET N/A 07/11/2020   Procedure: STONE EXTRACTION WITH WITH BALLOON;  Surgeon: Rogene Houston, MD;  [facility];  Service: Endoscopy;  Laterality: N/A;   TYMPANOSTOMY TUBE PLACEMENT      FAMILY HISTORY: Family History  Problem Relation Age of Onset   Migraines Mother    Gastric cancer Maternal Grandfather        "not sure where, but somewhere in the stomach area"   Colon cancer Neg Hx     SOCIAL HISTORY: Social History   Socioeconomic History   Marital status: Single    Spouse name: Not on file   Number of children: Not on file   Years of education: Not on file   Highest education level: Not on file  Occupational History   Not on  file  Tobacco Use   Smoking status: Former    Types: Cigarettes    Quit date: 12/03/2019    Years since quitting: 2.9   Smokeless tobacco: Never   Tobacco comments:    Smoked less than 1 pack per week for about 3 months  Vaping Use   Vaping Use: Never used  Substance and Sexual Activity   Alcohol use: Not Currently    Comment: may have 1-2 drinks/month. None currently.    Drug use: Yes    Types: Marijuana   Sexual activity: Yes  Other Topics Concern   Not on file  Social History Narrative   Not on file   Social Determinants of Health   Financial Resource Strain: Not on file  Food Insecurity: Not on file  Transportation Needs: Not on file  Physical Activity: Not on file  Stress: Not on file  Social Connections: Not on file  Intimate Partner Violence: Not on  file      PHYSICAL EXAM  Vitals:   10/28/22 1314  BP: 115/60  Pulse: (!) 57  Weight: 152 lb (68.9 kg)  Height: 5\' 2"  (1.575 m)    Body mass index is 27.8 kg/m.  Generalized: Well developed, in no acute distress   Neurological examination  Mentation: Alert oriented to time, place, history taking. Follows all commands speech and language fluent Cranial nerve II-XII: Pupils were equal round reactive to light. Extraocular movements were full, visual field were full on confrontational test. Facial sensation and strength were normal. Head turning and shoulder shrug  were normal and symmetric. Motor: The motor testing reveals 5 over 5 strength of all 4 extremities. Good symmetric motor tone is noted throughout.  Sensory: Sensory testing is intact to soft touch on all 4 extremities. No evidence of extinction is noted.  Coordination: Cerebellar testing reveals good finger-nose-finger and heel-to-shin bilaterally.  Gait and station: Gait is normal.    DIAGNOSTIC DATA (LABS, IMAGING, TESTING) - I reviewed patient records, labs, notes, testing and imaging myself where available.  Lab Results  Component Value Date    WBC 12.0 (H) 10/24/2020   HGB 14.4 10/24/2020   HCT 41.6 10/24/2020   MCV 78 (L) 10/24/2020   PLT 292 10/24/2020      Component Value Date/Time   NA 141 12/16/2020 0858   K 4.7 12/16/2020 0858   CL 105 12/16/2020 0858   CO2 19 (L) 12/16/2020 0858   GLUCOSE 86 12/16/2020 0858   GLUCOSE 100 (H) 09/14/2020 0950   BUN 9 12/16/2020 0858   CREATININE 0.66 12/16/2020 0858   CALCIUM 9.5 12/16/2020 0858   PROT 7.2 12/16/2020 0858   ALBUMIN 4.7 12/16/2020 0858   AST 15 12/16/2020 0858   ALT 9 12/16/2020 0858   ALKPHOS 65 12/16/2020 0858   BILITOT 0.3 12/16/2020 0858   GFRNONAA 61 10/24/2020 1009   GFRNONAA >60 09/14/2020 0950   GFRAA 70 10/24/2020 1009   Lab Results  Component Value Date   CHOL 176 07/08/2020   HDL 80 07/08/2020   LDLCALC 83 07/08/2020   TRIG 63 07/08/2020   CHOLHDL 2.2 07/08/2020       ASSESSMENT AND PLAN 22 y.o. year old female  has a past medical history of Bursitis, GERD (gastroesophageal reflux disease), Migraine headache, and PONV (postoperative nausea and vomiting). here with :  1.  Migraine headache  -Decrease propranolol to 20 mg BID -Start nortriptyline 10 mg at bedtime.  I have reviewed this medication with the patient and provided her with a handout. -Continue Ubrelvy for abortive therapy advised to take at the onset of symptoms.  Can repeat in 2 hours if needed -Follow-up in 3 months or sooner if needed     21, MSN, NP-C 10/28/2022, 1:12 PM [facility] [address] [place], [place] [phone]

## 2023-02-08 ENCOUNTER — Telehealth (INDEPENDENT_AMBULATORY_CARE_PROVIDER_SITE_OTHER): Payer: Commercial Managed Care - PPO | Admitting: Adult Health

## 2023-02-08 ENCOUNTER — Other Ambulatory Visit: Payer: Self-pay | Admitting: Adult Health

## 2023-02-08 DIAGNOSIS — G43101 Migraine with aura, not intractable, with status migrainosus: Secondary | ICD-10-CM | POA: Diagnosis not present

## 2023-02-08 MED ORDER — NORTRIPTYLINE HCL 10 MG PO CAPS: 20.0000 mg | ORAL_CAPSULE | Freq: Every day | ORAL | 3 refills | Status: DC

## 2023-02-08 NOTE — Patient Instructions (Addendum)
Your Plan:  Continue Propranolol  Increase nortriptyline 20 mg at bedtime. Continue Ubrelvy for abortive therapy advised to take at the onset of symptoms.  Can repeat in 2 hours if needed    Thank you for coming to see [place] at [facility]. I hope we have been able to provide you high quality care today.  You may receive a patient satisfaction survey over the next few weeks. We would appreciate your feedback and comments so that we may continue to improve ourselves and the health of our patients.

## 2023-02-08 NOTE — Progress Notes (Signed)
PATIENT: Annette Gould DOB: 13-Apr-2001  REASON FOR VISIT: follow up HISTORY FROM: patient  Virtual Visit via Video Note  I connected with Annette Gould on 02/08/23 at  1:00 PM EDT by a video enabled telemedicine application located remotely at [facility] and verified that I am speaking with the correct person using two identifiers who was located at their own home.   I discussed the limitations of evaluation and management by telemedicine and the availability of in person appointments. The patient expressed understanding and agreed to proceed.   PATIENT: Annette Gould DOB: 2001/03/13  REASON FOR VISIT: follow up HISTORY FROM: patient    HISTORY OF PRESENT ILLNESS: Today 02/08/23:  Annette Gould is a 22 y.o. female with a history of Migraine headaches. Returns today for follow-up.  At the last visit we decreased propranolol due to sluggishness.  She reports that has improved.  We also started nortriptyline 10 mg at bedtime.  She reports that it has helped her sleep but having headaches daily. She is wondering if it could be allergy related. No currently taking anything for her allergies. Has an eye appointment coming up for evaluation.   HISTORY 10/28/22:  Annette Gould is a 22 y.o. female with a history of migraine headaches. Returns today for follow-up.  She reports that she uses a phone app to help track her migraines.  She states that they have been "ok."  Having approximately 10-15 headache days a month.  Continues on propranolol 20 mg 3 times a day and takes [place] for abortive therapy. Does get a aura- usually visual changes, or trouble holding things.  She reports that Bernita Raisin has been beneficial but she often does not have it with her when she develops a headache.  She is also noticed that she is more sluggish since increasing her dose of propranolol  04/08/22: Annette Gould is a 22 year old female with a history of migraine. She returns today  for follow-up. Migraines are better. Reports that she has a headache "symptoms "every other day. Has numbness in hands or face. Feels sometimes that her whole body goes numb then followed by headache the next day. Sometimes she will just have the numbness and tingling. She has headache about 3 times a week.  Denies any nausea or vomiting. Light and noise sensitivity.  Reports trouble holding things.  Mother has rheumatoid arthritis.  Patient plans to discuss with her PCP  HISTORY 10/07/2021: She reports feeling fairly stable.  She has kept a log of her migraines in her cell phone which I reviewed, she has had some occasional tingling associated with her migraines, mostly tingling in her arms and face.  She has had no new symptoms as such, she has had 1 episode of lightheadedness, associated with tunnel vision.  She had a fall recently but did not trip over anything, felt like her legs were not under control.  She did not lose any consciousness or hurt herself thankfully.  She takes Bernita Raisin about 3 times per month on average.  She has not had any side effects from taking the propranolol which currently 20 mg once daily.  She has not started any other new medication, in particular, did not restart sertraline or any other antidepressant.  She hydrates well with water, drinks about 2 servings of caffeine containing beverages per day.  She does not drink any alcohol, she smokes marijuana in the evenings occasionally.  She has been advised to be  cautious with marijuana use because it can affect balance and can be sedating.  REVIEW OF SYSTEMS: Out of a complete 14 system review of symptoms, the patient complains only of the following symptoms, and all other reviewed systems are negative.  ALLERGIES: Allergies  Allergen Reactions   Peanut-Containing Drug Products Hives and Swelling    Peanuts    HOME MEDICATIONS: Outpatient Medications Prior to Visit  Medication Sig Dispense Refill   norethindrone (MICRONOR)  0.35 MG tablet Take 1 tablet by mouth daily.     nortriptyline (PAMELOR) 10 MG capsule Take 1 capsule (10 mg total) by mouth at bedtime. 90 capsule 3   propranolol (INDERAL) 20 MG tablet Take 1 tablet (20 mg total) by mouth 2 (two) times daily. 60 tablet 5   UBRELVY 100 MG TABS TAKE 1 TABLET BY MOUTH AS NEEDED. MAY REPEAT IN 2 HOURS IF NEEDED, NO MORE THAN 2 TABLETS IN 24 HOURS 10 tablet 3   No facility-administered medications prior to visit.    PAST MEDICAL HISTORY: Past Medical History:  Diagnosis Date   Bursitis    hips   GERD (gastroesophageal reflux disease)    Migraine headache    approx 2/month   PONV (postoperative nausea and vomiting)     PAST SURGICAL HISTORY: Past Surgical History:  Procedure Laterality Date   BIOPSY N/A 04/15/2020   Procedure: BIOPSY;  Surgeon: Pasty Spillers, MD;  Location: [facility];  Service: Endoscopy;  Laterality: N/A;   CHOLECYSTECTOMY N/A 07/10/2020   Procedure: LAPAROSCOPIC CHOLECYSTECTOMY WITH INTRAOPERATIVE CHOLANGIOGRAM;  Surgeon: [name], MD;  Location: [facility];  Service: General;  Laterality: N/A;   DENTAL SURGERY     ERCP N/A 07/11/2020   Procedure: ENDOSCOPIC RETROGRADE CHOLANGIOPANCREATOGRAPHY (ERCP);  Surgeon: Malissa Hippo, MD;  [facility];  Service: Endoscopy;  Laterality: N/A;   ESOPHAGOGASTRODUODENOSCOPY (EGD) WITH PROPOFOL N/A 04/15/2020   Procedure: ESOPHAGOGASTRODUODENOSCOPY (EGD) WITH PROPOFOL;  Surgeon: Pasty Spillers, MD;  [facility]: [facility];  Service: Endoscopy;  Laterality: N/A;   ESOPHAGOGASTRODUODENOSCOPY (EGD) WITH PROPOFOL N/A 12/25/2020   Procedure: ESOPHAGOGASTRODUODENOSCOPY (EGD) WITH PROPOFOL;  Surgeon: Rachael Fee, MD;  Location: [facility];  Service: Endoscopy;  Laterality: N/A;   EUS N/A 12/25/2020   Procedure: UPPER ENDOSCOPIC ULTRASOUND (EUS) RADIAL;  Surgeon: Rachael Fee, MD;  Location: [facility];  Service: Endoscopy;  Laterality: N/A;    SPHINCTEROTOMY N/A 07/11/2020   Procedure: SPHINCTEROTOMY PYLORICE CHANNEL DILALATION , NORMAL SIZE BILE DUCT;  Surgeon: Malissa Hippo, MD;  [facility];  Service: Endoscopy;  Laterality: N/A;   STONE EXTRACTION WITH BASKET N/A 07/11/2020   Procedure: STONE EXTRACTION WITH WITH BALLOON;  Surgeon: Malissa Hippo, MD;  [facility];  Service: Endoscopy;  Laterality: N/A;   TYMPANOSTOMY TUBE PLACEMENT      FAMILY HISTORY: Family History  Problem Relation Age of Onset   Migraines Mother    Gastric cancer Maternal Grandfather        "not sure where, but somewhere in the stomach area"   Colon cancer Neg Hx     SOCIAL HISTORY: Social History   Socioeconomic History   Marital status: Single    Spouse name: Not on file   Number of children: Not on file   Years of education: Not on file   Highest education level: Not on file  Occupational History   Not on file  Tobacco Use   Smoking status: Never   Smokeless tobacco: Never  Tobacco comments:    Smoked less than 1 pack per week for about 3 months. Update 10/28/2022 pt doesn't remember ever smoking. Says she's never tried a cigarette.   Vaping Use   Vaping Use: Never used  Substance and Sexual Activity   Alcohol use: Not Currently    Comment: very occasional, maybe once a month max   Drug use: Yes    Frequency: 5.0 times per week    Types: Marijuana   Sexual activity: Yes    Birth control/protection: Pill  Other Topics Concern   Not on file  Social History Narrative   Lives with roommate   Right handed   Caffeine: espresso shots about 4/day    Social Determinants of Health   Financial Resource Strain: Not on file  Food Insecurity: Not on file  Transportation Needs: Not on file  Physical Activity: Not on file  Stress: Not on file  Social Connections: Not on file  Intimate Partner Violence: Not on file      PHYSICAL EXAM Generalized: Well developed, in no acute distress   Neurological  examination  Mentation: Alert oriented to time, place, history taking. Follows all commands speech and language fluent Cranial nerve II-XII: Facial symmetry noted  DIAGNOSTIC DATA (LABS, IMAGING, TESTING) - I reviewed patient records, labs, notes, testing and imaging myself where available.  Lab Results  Component Value Date   WBC 12.0 (H) 10/24/2020   HGB 14.4 10/24/2020   HCT 41.6 10/24/2020   MCV 78 (L) 10/24/2020   PLT 292 10/24/2020      Component Value Date/Time   NA 141 12/16/2020 0858   K 4.7 12/16/2020 0858   CL 105 12/16/2020 0858   CO2 19 (L) 12/16/2020 0858   GLUCOSE 86 12/16/2020 0858   GLUCOSE 100 (H) 09/14/2020 0950   BUN 9 12/16/2020 0858   CREATININE 0.66 12/16/2020 0858   CALCIUM 9.5 12/16/2020 0858   PROT 7.2 12/16/2020 0858   ALBUMIN 4.7 12/16/2020 0858   AST 15 12/16/2020 0858   ALT 9 12/16/2020 0858   ALKPHOS 65 12/16/2020 0858   BILITOT 0.3 12/16/2020 0858   GFRNONAA 61 10/24/2020 1009   GFRNONAA >60 09/14/2020 0950   GFRAA 70 10/24/2020 1009   Lab Results  Component Value Date   CHOL 176 07/08/2020   HDL 80 07/08/2020   LDLCALC 83 07/08/2020   TRIG 63 07/08/2020   CHOLHDL 2.2 07/08/2020      ASSESSMENT AND PLAN 22 y.o. year old female  has a past medical history of Bursitis, GERD (gastroesophageal reflux disease), Migraine headache, and PONV (postoperative nausea and vomiting). here with:  1.  Migraine headache   - Continue propranolol to 20 mg BID -Increase nortriptyline 20 mg at bedtime. -Continue Ubrelvy for abortive therapy advised to take at the onset of symptoms.  Can repeat in 2 hours if needed -Consider repeating MRI of the brain at next visit to evaluate pineal cyst -Follow-up in 6 months months or sooner if needed    Butch Penny, MSN, NP-C 02/08/2023, 12:56 PM [facility] [address], [address] [place], [place] [address] [phone]

## 2023-09-19 ENCOUNTER — Telehealth: Payer: Self-pay | Admitting: Adult Health

## 2023-09-19 NOTE — Telephone Encounter (Signed)
.  myc

## 2023-09-20 ENCOUNTER — Telehealth: Payer: Commercial Managed Care - PPO | Admitting: Adult Health

## 2023-09-20 ENCOUNTER — Ambulatory Visit: Payer: Commercial Managed Care - PPO | Admitting: Adult Health

## 2023-09-20 DIAGNOSIS — D354 Benign neoplasm of pineal gland: Secondary | ICD-10-CM

## 2023-09-20 DIAGNOSIS — G43109 Migraine with aura, not intractable, without status migrainosus: Secondary | ICD-10-CM | POA: Diagnosis not present

## 2023-09-20 DIAGNOSIS — E348 Other specified endocrine disorders: Secondary | ICD-10-CM

## 2023-09-20 MED ORDER — NORTRIPTYLINE HCL 10 MG PO CAPS: 30.0000 mg | ORAL_CAPSULE | Freq: Every day | ORAL | 3 refills | Status: DC

## 2023-09-20 NOTE — Telephone Encounter (Signed)
Pt called back to change appt to a MyChart Video Visit for today.  .. Pt understands that although there may be some limitations with this type of visit, we will take all precautions to reduce any security or privacy concerns.  Pt understands that this will be treated like an in office visit and we will file with pt's insurance, and there may be a patient responsible charge related to this service.

## 2023-09-20 NOTE — Progress Notes (Signed)
PATIENT: Annette Gould DOB: [birth year]  REASON FOR VISIT: follow up HISTORY FROM: patient  Virtual Visit via Video Note  I connected with Annette Gould on 09/20/23 at 11:00 AM EST by a video enabled telemedicine application located remotely at [facility] and verified that I am speaking with the correct person using two identifiers who was located at their own home.   I discussed the limitations of evaluation and management by telemedicine and the availability of in person appointments. The patient expressed understanding and agreed to proceed.   PATIENT: Annette Gould DOB: 22-May-2001  REASON FOR VISIT: follow up HISTORY FROM: patient    HISTORY OF PRESENT ILLNESS: Today 09/20/23:  Annette Gould is a 22 y.o. female with a history of Migraine headaches. Returns today for follow-up. Reports headaches are better.  Has approximately 2 headaches a week. Will use ubrelvy and it works well if she takes it as soon as the headache starts.  She does state that she has noticed that when she travels to the mountains or the beach the first 2 days she will have a migraine.  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  Last scan was completed in February 2023.  Patient is asking about repeat imaging to ensure that the cyst has not changed.     02/08/23: Annette Gould is a 22 y.o. female with a history of Migraine headaches. Returns today for follow-up.  At the last visit we decreased propranolol due to sluggishness.  She reports that has improved.  We also started nortriptyline 10 mg at bedtime.  She reports that it has helped her sleep but having headaches daily. She is wondering if it could be allergy related. No currently taking anything for her allergies. Has an eye appointment coming up for evaluation.   HISTORY 10/28/22:  Annette Gould is a 22 y.o. female with a history of migraine headaches. Returns today for follow-up.  She  reports that she uses a phone app to help track her migraines.  She states that they have been "ok."  Having approximately 10-15 headache days a month.  Continues on propranolol 20 mg 3 times a day and takes [place] for abortive therapy. Does get a aura- usually visual changes, or trouble holding things.  She reports that Bernita Raisin has been beneficial but she often does not have it with her when she develops a headache.  She is also noticed that she is more sluggish since increasing her dose of propranolol  04/08/22: Annette Gould is a 22 year old female with a history of migraine. She returns today for follow-up. Migraines are better. Reports that she has a headache "symptoms "every other day. Has numbness in hands or face. Feels sometimes that her whole body goes numb then followed by headache the next day. Sometimes she will just have the numbness and tingling. She has headache about 3 times a week.  Denies any nausea or vomiting. Light and noise sensitivity.  Reports trouble holding things.  Mother has rheumatoid arthritis.  Patient plans to discuss with her PCP  HISTORY 10/07/2021: She reports feeling fairly stable.  She has kept a log of her migraines in her cell phone which I reviewed, she has had some occasional tingling associated with her migraines, mostly tingling in her arms and face.  She has had no new symptoms as such, she has had 1 episode of lightheadedness, associated with tunnel vision.  She had a fall recently but did not trip over anything, felt like her legs were not under control.  She did not lose any consciousness or hurt herself thankfully.  She takes Bernita Raisin about 3 times per month on average.  She has not had any side effects from taking the propranolol which currently 20 mg once daily.  She has not started any other new medication, in particular, did not restart sertraline or any other antidepressant.  She hydrates well with water, drinks about 2 servings of caffeine containing beverages  per day.  She does not drink any alcohol, she smokes marijuana in the evenings occasionally.  She has been advised to be cautious with marijuana use because it can affect balance and can be sedating.  REVIEW OF SYSTEMS: Out of a complete 14 system review of symptoms, the patient complains only of the following symptoms, and all other reviewed systems are negative.  ALLERGIES: Allergies  Allergen Reactions   Peanut-Containing Drug Products Hives and Swelling    Peanuts    HOME MEDICATIONS: Outpatient Medications Prior to Visit  Medication Sig Dispense Refill   norethindrone (MICRONOR) 0.35 MG tablet Take 1 tablet by mouth daily.     nortriptyline (PAMELOR) 10 MG capsule Take 2 capsules (20 mg total) by mouth at bedtime. 180 capsule 3   propranolol (INDERAL) 20 MG tablet Take 1 tablet (20 mg total) by mouth 2 (two) times daily. 60 tablet 5   Ubrogepant (UBRELVY) 100 MG TABS TAKE 1 TABLET BY MOUTH AS NEEDED. MAY REPEAT IN 2 HOURS IF NEEDED. NO MORE THAN 2 TABLET IN 24 HOURS 16 tablet 6   No facility-administered medications prior to visit.    PAST MEDICAL HISTORY: Past Medical History:  Diagnosis Date   Bursitis    hips   GERD (gastroesophageal reflux disease)    Migraine headache    approx 2/month   PONV (postoperative nausea and vomiting)     PAST SURGICAL HISTORY: Past Surgical History:  Procedure Laterality Date   BIOPSY N/A 04/15/2020   Procedure: BIOPSY;  Surgeon: Pasty Spillers, MD;  Location: [facility];  Service: Endoscopy;  Laterality: N/A;   CHOLECYSTECTOMY N/A 07/10/2020   Procedure: LAPAROSCOPIC CHOLECYSTECTOMY WITH INTRAOPERATIVE CHOLANGIOGRAM;  Surgeon: [name], MD;  Location: [facility];  Service: General;  Laterality: N/A;   DENTAL SURGERY     ERCP N/A 07/11/2020   Procedure: ENDOSCOPIC RETROGRADE CHOLANGIOPANCREATOGRAPHY (ERCP);  Surgeon: Malissa Hippo, MD;  [facility];  Service: Endoscopy;  Laterality: N/A;    ESOPHAGOGASTRODUODENOSCOPY (EGD) WITH PROPOFOL N/A 04/15/2020   Procedure: ESOPHAGOGASTRODUODENOSCOPY (EGD) WITH PROPOFOL;  Surgeon: Pasty Spillers, MD;  Location: [facility];  Service: Endoscopy;  Laterality: N/A;   ESOPHAGOGASTRODUODENOSCOPY (EGD) WITH PROPOFOL N/A 12/25/2020   Procedure: ESOPHAGOGASTRODUODENOSCOPY (EGD) WITH PROPOFOL;  Surgeon: Rachael Fee, MD;  [facility];  Service: Endoscopy;  Laterality: N/A;   EUS N/A 12/25/2020   Procedure: UPPER ENDOSCOPIC ULTRASOUND (EUS) RADIAL;  Surgeon: Rachael Fee, MD;  Location: [facility];  Service: Endoscopy;  Laterality: N/A;   SPHINCTEROTOMY N/A 07/11/2020   Procedure: SPHINCTEROTOMY PYLORICE CHANNEL DILALATION , NORMAL SIZE BILE DUCT;  Surgeon: Malissa Hippo, MD;  [facility];  Service: Endoscopy;  Laterality: N/A;   STONE EXTRACTION WITH BASKET N/A 07/11/2020   Procedure: STONE EXTRACTION WITH WITH BALLOON;  Surgeon: Malissa Hippo, MD;  [facility];  Service: Endoscopy;  Laterality: N/A;   TYMPANOSTOMY TUBE PLACEMENT  FAMILY HISTORY: Family History  Problem Relation Age of Onset   Migraines Mother    Gastric cancer Maternal Grandfather        "not sure where, but somewhere in the stomach area"   Colon cancer Neg Hx     SOCIAL HISTORY: Social History   Socioeconomic History   Marital status: Single    Spouse name: Not on file   Number of children: Not on file   Years of education: Not on file   Highest education level: Not on file  Occupational History   Not on file  Tobacco Use   Smoking status: Never   Smokeless tobacco: Never   Tobacco comments:    Smoked less than 1 pack per week for about 3 months. Update 10/28/2022 pt doesn't remember ever smoking. Says she's never tried a cigarette.   Vaping Use   Vaping status: Never Used  Substance and Sexual Activity   Alcohol use: Not Currently    Comment: very occasional, maybe once a month max   Drug use:  Yes    Frequency: 5.0 times per week    Types: Marijuana   Sexual activity: Yes    Birth control/protection: Pill  Other Topics Concern   Not on file  Social History Narrative   Lives with roommate   Right handed   Caffeine: espresso shots about 4/day    Social Drivers of Health   Financial Resource Strain: Not on file  Food Insecurity: Not on file  Transportation Needs: Not on file  Physical Activity: Not on file  Stress: Not on file  Social Connections: Not on file  Intimate Partner Violence: Not on file      PHYSICAL EXAM Generalized: Well developed, in no acute distress   Neurological examination  Mentation: Alert oriented to time, place, history taking. Follows all commands speech and language fluent Cranial nerve II-XII: Facial symmetry noted  DIAGNOSTIC DATA (LABS, IMAGING, TESTING) - I reviewed patient records, labs, notes, testing and imaging myself where available.  Lab Results  Component Value Date   WBC 12.0 (H) 10/24/2020   HGB 14.4 10/24/2020   HCT 41.6 10/24/2020   MCV 78 (L) 10/24/2020   PLT 292 10/24/2020      Component Value Date/Time   NA 141 12/16/2020 0858   K 4.7 12/16/2020 0858   CL 105 12/16/2020 0858   CO2 19 (L) 12/16/2020 0858   GLUCOSE 86 12/16/2020 0858   GLUCOSE 100 (H) 09/14/2020 0950   BUN 9 12/16/2020 0858   CREATININE 0.66 12/16/2020 0858   CALCIUM 9.5 12/16/2020 0858   PROT 7.2 12/16/2020 0858   ALBUMIN 4.7 12/16/2020 0858   AST 15 12/16/2020 0858   ALT 9 12/16/2020 0858   ALKPHOS 65 12/16/2020 0858   BILITOT 0.3 12/16/2020 0858   GFRNONAA 61 10/24/2020 1009   GFRNONAA >60 09/14/2020 0950   GFRAA 70 10/24/2020 1009   Lab Results  Component Value Date   CHOL 176 07/08/2020   HDL 80 07/08/2020   LDLCALC 83 07/08/2020   TRIG 63 07/08/2020   CHOLHDL 2.2 07/08/2020      ASSESSMENT AND PLAN 22 y.o. year old female  has a past medical history of Bursitis, GERD (gastroesophageal reflux disease), Migraine headache,  and PONV (postoperative nausea and vomiting). here with:  1.  Migraine headache 2.  Pineal cyst    - Continue propranolol to 20 mg BID -Increase nortriptyline 30 mg at bedtime. -Continue Ubrelvy for abortive therapy advised to take at  the onset of symptoms.  Can repeat in 2 hours if needed - MRI of the brain at to evaluate pineal cyst -Follow-up in 6 months months or sooner if needed    Butch Penny, MSN, NP-C 09/20/2023, 10:47 AM [facility] [address] [place], [place] [address] [phone]

## 2023-09-20 NOTE — Patient Instructions (Addendum)
Your Plan:  Lynelle Doctor for abortive therapy Increase nortriptyline to 30 mg at bedtime If your symptoms worsen or you develop new symptoms please let us know.       Thank you for coming to see [place] at [facility]. I hope we have been able to provide you high quality care today.  You may receive a patient satisfaction survey over the next few weeks. We would appreciate your feedback and comments so that we may continue to improve ourselves and the health of our patients.

## 2023-10-06 ENCOUNTER — Telehealth: Payer: Self-pay | Admitting: Adult Health

## 2023-10-06 NOTE — Telephone Encounter (Signed)
 Clent Ridges Berkley Harvey: [id number] exp. 12/25/23 sent to [facility]. [phone]

## 2023-10-14 ENCOUNTER — Ambulatory Visit (HOSPITAL_COMMUNITY)
Admission: RE | Admit: 2023-10-14 | Discharge: 2023-10-14 | Disposition: A | Discharge status: Home / Self Care | Payer: Commercial Managed Care - PPO | Source: Ambulatory Visit | Attending: Adult Health | Admitting: Adult Health

## 2023-10-14 DIAGNOSIS — E348 Other specified endocrine disorders: Secondary | ICD-10-CM | POA: Diagnosis present

## 2023-10-14 MED ORDER — GADOBUTROL 1 MMOL/ML IV SOLN
6.9000 mL | Freq: Once | INTRAVENOUS | Status: AC | PRN
  Administered 2023-10-14: 6.9 mL via INTRAVENOUS

## 2023-10-20 ENCOUNTER — Telehealth: Payer: Self-pay | Admitting: Adult Health

## 2023-10-20 DIAGNOSIS — R937 Abnormal findings on diagnostic imaging of other parts of musculoskeletal system: Secondary | ICD-10-CM

## 2023-10-20 NOTE — Telephone Encounter (Signed)
-----   Message from Huston Foley sent at 10/17/2023  4:15 PM EST ----- Recommend dedicated c spine MRI w/wo contrast to further delineate finding on brain MRI. Also anemia w/u with PCP recommended (iron studies, CBC and B12) and smoking cessation (if smoker or if vaping).  sa ----- Message ----- From: Butch Penny, NP Sent: 10/17/2023   4:13 PM EST To: Huston Foley, MD  I repeated an MRI to evaluate previous cyst that was found on imaging.  Cyst is stable but this time they found: . Abnormal T1 hypointense marrow signal within visualized portions of the cervical spine. While this finding can reflect a marrow infiltrative process, the most common causes include chronic anemia, smoking and obesity.  Is there any further workup that is needed?  Annette Gould ----- Message ----- From: Interface, Rad Results In Sent: 10/14/2023   7:13 PM EST To: Butch Penny, NP

## 2023-10-20 NOTE — Telephone Encounter (Signed)
I called the patient.  Had to leave a voicemail.  I will try to speak to the patient when she calls back if I am not with patients.  Otherwise I will try again this afternoon

## 2023-10-21 NOTE — Telephone Encounter (Signed)
I called patient reviewed MRI results with her.  She states that she has not smoked cigarettes in over a year.  She states that the last time she vaped was maybe in October.  I did encourage her to follow-up with PCP to rule out anemia with possible iron studies, CBC and B12.  We discussed doing a dedicated cervical MRI and she is amenable.  I will place that order today.

## 2023-10-21 NOTE — Addendum Note (Signed)
Addended by: Enedina Finner on: 10/21/2023 12:34 PM   Modules accepted: Orders

## 2023-11-01 ENCOUNTER — Telehealth: Payer: Self-pay | Admitting: Adult Health

## 2023-11-01 NOTE — Telephone Encounter (Signed)
[facility] Berkley Harvey: [phone] exp.11/01/23-01/27/24 sent to [facility] [phone]

## 2023-11-08 ENCOUNTER — Ambulatory Visit (HOSPITAL_COMMUNITY)
Admission: RE | Admit: 2023-11-08 | Discharge: 2023-11-08 | Disposition: A | Discharge status: Home / Self Care | Payer: Commercial Managed Care - PPO | Source: Ambulatory Visit | Attending: Adult Health | Admitting: Adult Health

## 2023-11-08 DIAGNOSIS — R937 Abnormal findings on diagnostic imaging of other parts of musculoskeletal system: Secondary | ICD-10-CM | POA: Diagnosis present

## 2023-11-08 MED ORDER — GADOBUTROL 1 MMOL/ML IV SOLN
7.0000 mL | Freq: Once | INTRAVENOUS | Status: AC | PRN
  Administered 2023-11-08: 7 mL via INTRAVENOUS

## 2023-11-16 ENCOUNTER — Telehealth: Payer: Self-pay | Admitting: Adult Health

## 2023-11-16 NOTE — Telephone Encounter (Signed)
I called the patient.  Reviewed cervical MRI with the patient.  Encouraged her to follow-up with her PCP to check anemia panels.  She does verify that she used to smoke in the past but she is currently not smoking cigarettes.  I did advise that I would send this to Dr. Frances Furbish to review to see if she had any further recommendations

## 2023-11-17 NOTE — Telephone Encounter (Signed)
We can also monitor C-spine cord abnormality with a follow-up MRI in about 3 months.  Aundra Millet, can you make yourself a reminder and order MRI C-spine with and without contrast for May or June?

## 2023-11-17 NOTE — Telephone Encounter (Signed)
I called pt Annette Gould for her that will send her a mychart message about Dr. Teofilo Pod recommendation.  She will let us know if she has any questions.

## 2023-11-17 NOTE — Telephone Encounter (Signed)
Yes I will do that.   POD 4 can you call patient and make her aware

## 2023-12-13 NOTE — Telephone Encounter (Signed)
 I called and LMVM for pt that was following up on a mychart message that pt did not read.  LMVM for her relating to plan per Dr. Frances Furbish that We can also monitor C-spine cord abnormality with a follow-up MRI in about 3 months.  Pt was given results previously by MM/NP.

## 2024-01-18 ENCOUNTER — Other Ambulatory Visit: Payer: Self-pay | Admitting: *Deleted

## 2024-01-18 ENCOUNTER — Telehealth: Payer: Self-pay | Admitting: Adult Health

## 2024-01-18 MED ORDER — PROPRANOLOL HCL 20 MG PO TABS: 20.0000 mg | ORAL_TABLET | Freq: Two times a day (BID) | ORAL | 5 refills | Status: DC

## 2024-01-18 MED ORDER — UBRELVY 100 MG PO TABS: ORAL_TABLET | ORAL | 5 refills | Status: DC

## 2024-01-18 MED ORDER — NORTRIPTYLINE HCL 10 MG PO CAPS: 30.0000 mg | ORAL_CAPSULE | Freq: Every day | ORAL | 1 refills | Status: DC

## 2024-01-18 NOTE — Telephone Encounter (Signed)
 Refills sent to [facility] on [address] in [place]. I called pt's previous [facility] pharmacy and found out their location closed. Automatic message [facility] [address] and LVM to cancel the prescriptions for Ubrelvy, Propranolol, and Nortriptyline.

## 2024-01-18 NOTE — Telephone Encounter (Signed)
 Pt request refills for Nortriptyline (PAMELOR) 10 MG capsule and Ubrogepant (UBRELVY) 100 MG TABS and  Propranolol (INDERAL) 20 MG tablet send to:  [facility] [address] [place], [place]  [address] Phone: [phone] ;

## 2024-01-18 NOTE — Telephone Encounter (Signed)
 Pt notifying changing pharmacy for all medication prescribed with [facility]. [facility] [address] [place], [place]  [address] Phone: [phone]

## 2024-02-16 ENCOUNTER — Telehealth: Payer: Self-pay | Admitting: Adult Health

## 2024-02-16 DIAGNOSIS — R937 Abnormal findings on diagnostic imaging of other parts of musculoskeletal system: Secondary | ICD-10-CM

## 2024-02-16 NOTE — Telephone Encounter (Signed)
-----   Message from [facility] sent at 11/17/2023 10:45 AM EST ----- Cervical spine repeat

## 2024-02-16 NOTE — Telephone Encounter (Signed)
 Per Dr. Omar Bibber recommendation she suggested repeating MRI cervical spine at the 59-month mark.  Please call and see if patient is amenable

## 2024-02-20 NOTE — Addendum Note (Signed)
 Addended by: Inocencio Mania S on: 02/20/2024 10:14 AM   Modules accepted: Orders

## 2024-02-20 NOTE — Addendum Note (Signed)
 Addended by: Curry Dove on: 02/20/2024 04:11 PM   Modules accepted: Orders

## 2024-02-20 NOTE — Telephone Encounter (Signed)
 I do not see any recent lab work to look at her kidney function.  Annette Gould can we make sure wherever she scheduled they check kidney function prior to doing the scan with and without contrast

## 2024-02-20 NOTE — Telephone Encounter (Signed)
 I called pt and relayed that it is time for the MRI cervical spine 3 month recheck.   "Monitor C-spine cord abnormality with a follow-up MRI in about 3 months".   Pt is ok to proceed.  Had done last at [facility].   She would like to go there again.

## 2024-02-20 NOTE — Telephone Encounter (Signed)
 Called pt and LVM (ok per DPR) asking if ok to order MRI c-spine for a 3 month check which was previously recommended by Dr Omar Bibber. Left office number for call back. Also sent mychart message.

## 2024-02-28 LAB — COMPREHENSIVE METABOLIC PANEL WITH GFR: EGFR (Non-African Amer.): 132

## 2024-02-28 NOTE — Telephone Encounter (Signed)
 I called pt and about needing a BMP prior to her going to have her MRI done.  She has not been scheduled yet.  She had labs today at her PCP, Dr. Helyn Lobstein.  I donot see this yet.  Will follow. I donot see anything as yet. Will. Follow.

## 2024-02-28 NOTE — Telephone Encounter (Signed)
 Can we have patient come in for BMP to check kidney function prior to imaging.

## 2024-02-28 NOTE — Telephone Encounter (Signed)
 Letcher Rattler Siegfried Dress: [id number] exp. 02/23/24-05/24/24 sent to [facility] Done [phone]

## 2024-03-08 NOTE — Telephone Encounter (Signed)
 Annette Gould

## 2024-03-08 NOTE — Telephone Encounter (Signed)
 Spoke with patient. MRI is on Monday 6/9. Ok to proceed from lab standpoint per MM NP. Pt thanked me for the call.

## 2024-03-08 NOTE — Telephone Encounter (Signed)
Ok to proceed with MRI

## 2024-03-12 ENCOUNTER — Ambulatory Visit (HOSPITAL_COMMUNITY)
Admission: RE | Admit: 2024-03-12 | Discharge: 2024-03-12 | Disposition: A | Discharge status: Home / Self Care | Source: Ambulatory Visit | Attending: Adult Health | Admitting: Adult Health

## 2024-03-12 DIAGNOSIS — R937 Abnormal findings on diagnostic imaging of other parts of musculoskeletal system: Secondary | ICD-10-CM | POA: Diagnosis not present

## 2024-03-12 MED ORDER — GADOBUTROL 1 MMOL/ML IV SOLN: 7.0000 mL | Freq: Once | INTRAVENOUS | Status: DC | PRN

## 2024-03-12 MED ORDER — GADOBUTROL 1 MMOL/ML IV SOLN
7.0000 mL | Freq: Once | INTRAVENOUS | Status: AC | PRN
  Administered 2024-03-12: 7 mL via INTRAVENOUS

## 2024-03-29 ENCOUNTER — Ambulatory Visit: Payer: Self-pay | Admitting: Adult Health

## 2024-04-02 NOTE — Telephone Encounter (Signed)
 Based on stable cervical spine MRI, I do not recommend any additional workup but may consider 1 more repeat MRI a year from the latest to check for stability.

## 2024-04-04 ENCOUNTER — Encounter: Payer: Self-pay | Admitting: Family Medicine

## 2024-04-04 NOTE — Progress Notes (Signed)
 Noted.  Created a remind me to contact the patient in 1 year

## 2024-09-25 ENCOUNTER — Telehealth: Payer: Commercial Managed Care - PPO | Admitting: Adult Health

## 2024-09-25 DIAGNOSIS — E348 Other specified endocrine disorders: Secondary | ICD-10-CM | POA: Diagnosis not present

## 2024-09-25 DIAGNOSIS — R937 Abnormal findings on diagnostic imaging of other parts of musculoskeletal system: Secondary | ICD-10-CM

## 2024-09-25 DIAGNOSIS — G43109 Migraine with aura, not intractable, without status migrainosus: Secondary | ICD-10-CM | POA: Diagnosis not present

## 2024-09-25 MED ORDER — UBRELVY 100 MG PO TABS: ORAL_TABLET | ORAL | 5 refills | Status: AC

## 2024-09-25 MED ORDER — NORTRIPTYLINE HCL 10 MG PO CAPS: 10.0000 mg | ORAL_CAPSULE | Freq: Every day | ORAL | 3 refills | Status: AC

## 2024-09-25 NOTE — Progress Notes (Signed)
 "    PATIENT: Annette Gould DOB: 14-Jul-2001  REASON FOR VISIT: follow up HISTORY FROM: patient  Virtual Visit via Video Note  I connected with Annette Gould on 09/25/2024 at  2:00 PM EST by a video enabled telemedicine application located remotely at [facility] and verified that I am speaking with the correct person using two identifiers who was located at their own home in [place].   I discussed the limitations of evaluation and management by telemedicine and the availability of in person appointments. The patient expressed understanding and agreed to proceed.   PATIENT: Annette Gould DOB: Apr 25, 2001  REASON FOR VISIT: follow up HISTORY FROM: patient    HISTORY OF PRESENT ILLNESS: Today 09/25/2024:  Annette Gould is a 23 y.o. female with a history of migraine headaches. Returns today for follow-up.  She reports that unfortunately she has been out of her medication for several months.  She states that her pharmacy abruptly shut down and she did not know how to contact our office.  She states that she currently gets 2-3 headaches a week.  Ubrelvy  typically gets rid of the headache or she has to sleep it off.  She does feel that nortriptyline  was beneficial.  She does with her headaches sometimes get a visual aura where she feels that things are moving.  She also noticed sensory changes in her hands and then the headache follows.  She returns today for follow-up  09/20/23: Annette Gould is a 23 y.o. female with a history of Migraine headaches. Returns today for follow-up. Reports headaches are better.  Has approximately 2 headaches a week. Will use ubrelvy  and it works well if she takes it as soon as the headache starts.  She does state that she has noticed that when she travels to the mountains or the beach the first 2 days she will have a migraine.  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  Last scan was completed in February 2023.   Patient is asking about repeat imaging to ensure that the cyst has not changed.   02/08/23: Annette Gould is a 23 y.o. female with a history of Migraine headaches. Returns today for follow-up.  At the last visit we decreased propranolol  due to sluggishness.  She reports that has improved.  We also started nortriptyline  10 mg at bedtime.  She reports that it has helped her sleep but having headaches daily. She is wondering if it could be allergy related. No currently taking anything for her allergies. Has an eye appointment coming up for evaluation.   HISTORY 10/28/22:  Annette Gould is a 23 y.o. female with a history of migraine headaches. Returns today for follow-up.  She reports that she uses a phone app to help track her migraines.  She states that they have been ok.  Having approximately 10-15 headache days a month.  Continues on propranolol  20 mg 3 times a day and takes Ubrelvy  for abortive therapy. Does get a aura- usually visual changes, or trouble holding things.  She reports that Ubrelvy  has been beneficial but she often does not have it with her when she develops a headache.  She is also noticed that she is more sluggish since increasing her dose of propranolol   04/08/22: Annette Gould is a 23 year old female with a history of migraine. She returns today for follow-up. Migraines are better. Reports that she has a headache symptoms every other day.  Has numbness in hands or face. Feels sometimes that her whole body goes numb then followed by headache the next day. Sometimes she will just have the numbness and tingling. She has headache about 3 times a week.  Denies any nausea or vomiting. Light and noise sensitivity.  Reports trouble holding things.  Mother has rheumatoid arthritis.  Patient plans to discuss with her PCP  HISTORY 10/07/2021: She reports feeling fairly stable.  She has kept a log of her migraines in her cell phone which I reviewed, she has had some occasional tingling associated  with her migraines, mostly tingling in her arms and face.  She has had no new symptoms as such, she has had 1 episode of lightheadedness, associated with tunnel vision.  She had a fall recently but did not trip over anything, felt like her legs were not under control.  She did not lose any consciousness or hurt herself thankfully.  She takes Ubrelvy  about 3 times per month on average.  She has not had any side effects from taking the propranolol  which currently 20 mg once daily.  She has not started any other new medication, in particular, did not restart sertraline or any other antidepressant.  She hydrates well with water , drinks about 2 servings of caffeine containing beverages per day.  She does not drink any alcohol, she smokes marijuana in the evenings occasionally.  She has been advised to be cautious with marijuana use because it can affect balance and can be sedating.  REVIEW OF SYSTEMS: Out of a complete 14 system review of symptoms, the patient complains only of the following symptoms, and all other reviewed systems are negative.  ALLERGIES: Allergies  Allergen Reactions   Peanut-Containing Drug Products Hives and Swelling    Peanuts    HOME MEDICATIONS: Outpatient Medications Prior to Visit  Medication Sig Dispense Refill   norethindrone (MICRONOR) 0.35 MG tablet Take 1 tablet by mouth daily.     nortriptyline  (PAMELOR ) 10 MG capsule Take 3 capsules (30 mg total) by mouth at bedtime. 270 capsule 1   propranolol  (INDERAL ) 20 MG tablet Take 1 tablet (20 mg total) by mouth 2 (two) times daily. 60 tablet 5   Ubrogepant  (UBRELVY ) 100 MG TABS TAKE 1 TABLET BY MOUTH AS NEEDED. MAY REPEAT IN 2 HOURS IF NEEDED. NO MORE THAN 2 TABLET IN 24 HOURS 16 tablet 5   No facility-administered medications prior to visit.    PAST MEDICAL HISTORY: Past Medical History:  Diagnosis Date   Bursitis    hips   GERD (gastroesophageal reflux disease)    Migraine headache    approx 2/month   PONV  (postoperative nausea and vomiting)     PAST SURGICAL HISTORY: Past Surgical History:  Procedure Laterality Date   BIOPSY N/A 04/15/2020   Procedure: BIOPSY;  Surgeon: Janalyn Keene NOVAK, MD;  Location: [facility];  Service: Endoscopy;  Laterality: N/A;   CHOLECYSTECTOMY N/A 07/10/2020   Procedure: LAPAROSCOPIC CHOLECYSTECTOMY WITH INTRAOPERATIVE CHOLANGIOGRAM;  Surgeon: Mavis Anes, MD;  Location: [facility];  Service: General;  Laterality: N/A;   DENTAL SURGERY     ERCP N/A 07/11/2020   Procedure: ENDOSCOPIC RETROGRADE CHOLANGIOPANCREATOGRAPHY (ERCP);  Surgeon: Golda Claudis PENNER, MD;  Location: [facility];  Service: Endoscopy;  Laterality: N/A;   ESOPHAGOGASTRODUODENOSCOPY (EGD) WITH PROPOFOL  N/A 04/15/2020   Procedure: ESOPHAGOGASTRODUODENOSCOPY (EGD) WITH PROPOFOL ;  Surgeon: Janalyn Keene NOVAK, MD;  Location: [facility];  Service: Endoscopy;  Laterality: N/A;   ESOPHAGOGASTRODUODENOSCOPY (EGD) WITH PROPOFOL  N/A  12/25/2020   Procedure: ESOPHAGOGASTRODUODENOSCOPY (EGD) WITH PROPOFOL ;  Surgeon: Teressa Toribio SQUIBB, MD;  Location: [facility];  Service: Endoscopy;  Laterality: N/A;   EUS N/A 12/25/2020   Procedure: UPPER ENDOSCOPIC ULTRASOUND (EUS) RADIAL;  Surgeon: Teressa Toribio SQUIBB, MD;  Location: [facility];  Service: Endoscopy;  Laterality: N/A;   SPHINCTEROTOMY N/A 07/11/2020   Procedure: SPHINCTEROTOMY PYLORICE CHANNEL DILALATION , NORMAL SIZE BILE DUCT;  Surgeon: Golda Claudis PENNER, MD;  Location: [facility];  Service: Endoscopy;  Laterality: N/A;   STONE EXTRACTION WITH BASKET N/A 07/11/2020   Procedure: STONE EXTRACTION WITH WITH BALLOON;  Surgeon: Golda Claudis PENNER, MD;  Location: [facility];  Service: Endoscopy;  Laterality: N/A;   TYMPANOSTOMY TUBE PLACEMENT      FAMILY HISTORY: Family History  Problem Relation Age of Onset   Migraines Mother    Gastric cancer Maternal Grandfather        not sure where, but somewhere in the stomach area   Colon cancer  Neg Hx     SOCIAL HISTORY: Social History   Socioeconomic History   Marital status: Single    Spouse name: Not on file   Number of children: Not on file   Years of education: Not on file   Highest education level: Not on file  Occupational History   Not on file  Tobacco Use   Smoking status: Never   Smokeless tobacco: Never   Tobacco comments:    Smoked less than 1 pack per week for about 3 months. Update 10/28/2022 pt doesn't remember ever smoking. Says she's never tried a cigarette.   Vaping Use   Vaping status: Never Used  Substance and Sexual Activity   Alcohol use: Not Currently    Comment: very occasional, maybe once a month max   Drug use: Yes    Frequency: 5.0 times per week    Types: Marijuana   Sexual activity: Yes    Birth control/protection: Pill  Other Topics Concern   Not on file  Social History Narrative   Lives with roommate   Right handed   Caffeine: espresso shots about 4/day    Social Drivers of Health   Tobacco Use: Low Risk (10/28/2022)   Patient History    Smoking Tobacco Use: Never    Smokeless Tobacco Use: Never    Passive Exposure: Not on file  Recent Concern: Tobacco Use - Medium Risk (10/28/2022)   Patient History    Smoking Tobacco Use: Former    Smokeless Tobacco Use: Never    Passive Exposure: Not on Actuary Strain: Not on file  Food Insecurity: Not on file  Transportation Needs: Not on file  Physical Activity: Not on file  Stress: Not on file  Social Connections: Not on file  Intimate Partner Violence: Not on file  Depression (EYV7-0): Not on file  Alcohol Screen: Not on file  Housing: Not on file  Utilities: Not on file  Health Literacy: Not on file      PHYSICAL EXAM Generalized: Well developed, in no acute distress   Neurological examination  Mentation: Alert oriented to time, place, history taking. Follows all commands speech and language fluent Cranial nerve II-XII: Facial symmetry  noted  DIAGNOSTIC DATA (LABS, IMAGING, TESTING) - I reviewed patient records, labs, notes, testing and imaging myself where available.  Lab Results  Component Value Date   WBC 12.0 (H) 10/24/2020   HGB 14.4 10/24/2020   HCT 41.6 10/24/2020   MCV 78 (L) 10/24/2020  PLT 292 10/24/2020      Component Value Date/Time   NA 141 12/16/2020 0858   K 4.7 12/16/2020 0858   CL 105 12/16/2020 0858   CO2 19 (L) 12/16/2020 0858   GLUCOSE 86 12/16/2020 0858   GLUCOSE 100 (H) 09/14/2020 0950   BUN 9 12/16/2020 0858   CREATININE 0.66 12/16/2020 0858   CALCIUM 9.5 12/16/2020 0858   PROT 7.2 12/16/2020 0858   ALBUMIN 4.7 12/16/2020 0858   AST 15 12/16/2020 0858   ALT 9 12/16/2020 0858   ALKPHOS 65 12/16/2020 0858   BILITOT 0.3 12/16/2020 0858   GFRNONAA 132 02/28/2024 1427   GFRAA 70 10/24/2020 1009   Lab Results  Component Value Date   CHOL 176 07/08/2020   HDL 80 07/08/2020   LDLCALC 83 07/08/2020   TRIG 63 07/08/2020   CHOLHDL 2.2 07/08/2020      ASSESSMENT AND PLAN 23 y.o. year old female  has a past medical history of Bursitis, GERD (gastroesophageal reflux disease), Migraine headache, and PONV (postoperative nausea and vomiting). here with:  1.  Migraine headache 2.  Pineal cyst     - Restart nortriptyline  10 mg at bedtime. -Continue Ubrelvy  for abortive therapy advised to take at the onset of symptoms.  Can repeat in 2 hours if needed - Will consider repeating imaging (cervical MRI) at the next visit. -Follow-up in 6 months months or sooner if needed    Duwaine Russell, MSN, NP-C 09/25/2024, 2:04 PM [facility] [address], [address] [place], [place] [address] [phone]  The patient's condition requires frequent monitoring and adjustments in the treatment plan, reflecting the ongoing complexity of care.  This provider is the continuing focal point for all needed services for this condition.  "

## 2024-09-25 NOTE — Patient Instructions (Signed)
 Your Plan:  Restart nortriptyline  10 mg at bedtime Continue Ubrelvy  for abortive therapy Please call or send a MyChart message if you have questions or concerns in the meantime.     Thank you for coming to see us  at [facility]. I hope we have been able to provide you high quality care today.  You may receive a patient satisfaction survey over the next few weeks. We would appreciate your feedback and comments so that we may continue to improve ourselves and the health of our patients.

## 2024-10-03 ENCOUNTER — Other Ambulatory Visit (HOSPITAL_COMMUNITY): Payer: Self-pay

## 2024-10-03 ENCOUNTER — Telehealth: Payer: Self-pay

## 2024-10-03 NOTE — Telephone Encounter (Signed)
 Hello Prescriber!  We are in the process of submitting a prior authorization for your patient for Ubrelvy . We are reaching out for clinical guidance for the following information to complete the request: The patient's plan requires documentation showing that patient to has tried and failed, or have contraindication to the following agents before they will approve our request: Two Triptans. Or please advise if you would like to switch prescribed medication per insurance requirement.   Thank you! Pharmacy Team   [email or web address]:[phone]

## 2024-10-09 NOTE — Telephone Encounter (Signed)
 Following up-PA will expire soon. Thanks.

## 2025-03-26 ENCOUNTER — Telehealth: Admitting: Adult Health
